# Patient Record
Sex: Male | Born: 1970 | Race: White | Hispanic: No | Marital: Married | Smoking: Never smoker
Health system: Southern US, Community
[De-identification: ages and names within clinical notes are randomized; demographics above are authoritative.]

## PROBLEM LIST (undated history)

## (undated) DIAGNOSIS — S83249A Other tear of medial meniscus, current injury, unspecified knee, initial encounter: Secondary | ICD-10-CM

## (undated) DIAGNOSIS — T7840XA Allergy, unspecified, initial encounter: Secondary | ICD-10-CM

## (undated) DIAGNOSIS — R030 Elevated blood-pressure reading, without diagnosis of hypertension: Secondary | ICD-10-CM

## (undated) DIAGNOSIS — T4145XA Adverse effect of unspecified anesthetic, initial encounter: Secondary | ICD-10-CM

## (undated) DIAGNOSIS — G249 Dystonia, unspecified: Secondary | ICD-10-CM

## (undated) DIAGNOSIS — Z87442 Personal history of urinary calculi: Secondary | ICD-10-CM

## (undated) DIAGNOSIS — N200 Calculus of kidney: Secondary | ICD-10-CM

## (undated) DIAGNOSIS — R51 Headache: Secondary | ICD-10-CM

## (undated) DIAGNOSIS — G5620 Lesion of ulnar nerve, unspecified upper limb: Secondary | ICD-10-CM

## (undated) DIAGNOSIS — M7542 Impingement syndrome of left shoulder: Secondary | ICD-10-CM

## (undated) DIAGNOSIS — T8859XA Other complications of anesthesia, initial encounter: Secondary | ICD-10-CM

## (undated) DIAGNOSIS — K859 Acute pancreatitis without necrosis or infection, unspecified: Secondary | ICD-10-CM

## (undated) DIAGNOSIS — F419 Anxiety disorder, unspecified: Secondary | ICD-10-CM

## (undated) DIAGNOSIS — Z9049 Acquired absence of other specified parts of digestive tract: Secondary | ICD-10-CM

## (undated) HISTORY — DX: Impingement syndrome of left shoulder: M75.42

## (undated) HISTORY — DX: Acute pancreatitis without necrosis or infection, unspecified: K85.90

## (undated) HISTORY — DX: Dystonia, unspecified: G24.9

## (undated) HISTORY — PX: ESOPHAGOGASTRODUODENOSCOPY: SHX1529

## (undated) HISTORY — DX: Calculus of kidney: N20.0

## (undated) HISTORY — PX: COLON SURGERY: SHX602

## (undated) HISTORY — DX: Lesion of ulnar nerve, unspecified upper limb: G56.20

## (undated) HISTORY — DX: Allergy, unspecified, initial encounter: T78.40XA

## (undated) HISTORY — DX: Anxiety disorder, unspecified: F41.9

## (undated) HISTORY — DX: Other tear of medial meniscus, current injury, unspecified knee, initial encounter: S83.249A

## (undated) HISTORY — PX: HERNIA REPAIR: SHX51

## (undated) HISTORY — PX: WISDOM TOOTH EXTRACTION: SHX21

## (undated) HISTORY — DX: Headache: R51

---

## 1991-06-03 HISTORY — PX: CLOSED REDUCTION CLAVICLE FRACTURE: SUR253

## 1998-06-02 HISTORY — PX: LITHOTRIPSY: SUR834

## 2006-06-02 HISTORY — PX: COLONOSCOPY: SHX174

## 2006-06-02 HISTORY — PX: GALLBLADDER SURGERY: SHX652

## 2006-06-02 LAB — HM COLONOSCOPY

## 2006-12-02 ENCOUNTER — Encounter: Payer: Self-pay | Admitting: Family Medicine

## 2007-02-24 ENCOUNTER — Encounter: Payer: Self-pay | Admitting: Family Medicine

## 2007-03-25 ENCOUNTER — Encounter: Payer: Self-pay | Admitting: Family Medicine

## 2007-04-05 ENCOUNTER — Ambulatory Visit (HOSPITAL_COMMUNITY): Admission: RE | Admit: 2007-04-05 | Discharge: 2007-04-05 | Payer: Self-pay | Admitting: Gastroenterology

## 2007-05-17 ENCOUNTER — Encounter (INDEPENDENT_AMBULATORY_CARE_PROVIDER_SITE_OTHER): Payer: Self-pay | Admitting: General Surgery

## 2007-05-17 ENCOUNTER — Ambulatory Visit (HOSPITAL_COMMUNITY): Admission: RE | Admit: 2007-05-17 | Discharge: 2007-05-17 | Payer: Self-pay | Admitting: General Surgery

## 2007-06-03 HISTORY — PX: CHOLECYSTECTOMY: SHX55

## 2009-06-02 HISTORY — PX: HERNIA REPAIR: SHX51

## 2009-06-02 HISTORY — PX: OTHER SURGICAL HISTORY: SHX169

## 2010-01-22 ENCOUNTER — Ambulatory Visit: Payer: Self-pay | Admitting: Family Medicine

## 2010-01-22 DIAGNOSIS — M25519 Pain in unspecified shoulder: Secondary | ICD-10-CM

## 2010-01-22 DIAGNOSIS — G43909 Migraine, unspecified, not intractable, without status migrainosus: Secondary | ICD-10-CM

## 2010-01-22 DIAGNOSIS — N200 Calculus of kidney: Secondary | ICD-10-CM | POA: Insufficient documentation

## 2010-01-22 DIAGNOSIS — J309 Allergic rhinitis, unspecified: Secondary | ICD-10-CM

## 2010-01-22 DIAGNOSIS — Z9189 Other specified personal risk factors, not elsewhere classified: Secondary | ICD-10-CM | POA: Insufficient documentation

## 2010-01-22 HISTORY — DX: Pain in unspecified shoulder: M25.519

## 2010-01-22 HISTORY — DX: Migraine, unspecified, not intractable, without status migrainosus: G43.909

## 2010-01-22 HISTORY — DX: Calculus of kidney: N20.0

## 2010-01-22 HISTORY — DX: Allergic rhinitis, unspecified: J30.9

## 2010-01-23 LAB — CONVERTED CEMR LAB
AST: 32 units/L (ref 0–37)
Alkaline Phosphatase: 65 units/L (ref 39–117)
Cholesterol: 157 mg/dL (ref 0–200)
Glucose, Bld: 97 mg/dL (ref 70–99)
Total CHOL/HDL Ratio: 6
Total Protein: 7.5 g/dL (ref 6.0–8.3)

## 2010-01-28 ENCOUNTER — Telehealth: Payer: Self-pay | Admitting: Family Medicine

## 2010-01-30 ENCOUNTER — Telehealth: Payer: Self-pay | Admitting: Family Medicine

## 2010-02-01 DIAGNOSIS — Z8719 Personal history of other diseases of the digestive system: Secondary | ICD-10-CM | POA: Insufficient documentation

## 2010-02-01 HISTORY — DX: Personal history of other diseases of the digestive system: Z87.19

## 2010-02-05 ENCOUNTER — Telehealth: Payer: Self-pay | Admitting: Family Medicine

## 2010-02-20 ENCOUNTER — Telehealth: Payer: Self-pay | Admitting: Family Medicine

## 2010-02-20 DIAGNOSIS — R300 Dysuria: Secondary | ICD-10-CM | POA: Insufficient documentation

## 2010-03-18 ENCOUNTER — Encounter: Payer: Self-pay | Admitting: Family Medicine

## 2010-05-03 ENCOUNTER — Ambulatory Visit: Payer: Self-pay | Admitting: Family Medicine

## 2010-05-10 ENCOUNTER — Encounter: Payer: Self-pay | Admitting: Family Medicine

## 2010-05-31 ENCOUNTER — Telehealth: Payer: Self-pay | Admitting: Family Medicine

## 2010-05-31 DIAGNOSIS — F411 Generalized anxiety disorder: Secondary | ICD-10-CM

## 2010-05-31 DIAGNOSIS — F419 Anxiety disorder, unspecified: Secondary | ICD-10-CM | POA: Insufficient documentation

## 2010-05-31 HISTORY — DX: Generalized anxiety disorder: F41.1

## 2010-06-02 HISTORY — PX: APPENDECTOMY: SHX54

## 2010-06-11 ENCOUNTER — Telehealth (INDEPENDENT_AMBULATORY_CARE_PROVIDER_SITE_OTHER): Payer: Self-pay | Admitting: *Deleted

## 2010-07-01 ENCOUNTER — Ambulatory Visit
Admission: RE | Admit: 2010-07-01 | Discharge: 2010-07-01 | Payer: Self-pay | Source: Home / Self Care | Attending: Family Medicine | Admitting: Family Medicine

## 2010-07-02 ENCOUNTER — Telehealth: Payer: Self-pay | Admitting: Family Medicine

## 2010-07-02 NOTE — Consult Note (Signed)
Summary: [facility]  [facility]   Imported By: Lanelle Bal 03/26/2010 09:22:42  _____________________________________________________________________  External Attachment:    Type:   Image     Comment:   External Document

## 2010-07-02 NOTE — Assessment & Plan Note (Signed)
Summary: CPX/TRANSFER FROM [place]/[facility]   Vital Signs:  Patient profile:   40 year old male Height:      67 inches Weight:      199.50 pounds BMI:     31.36 Temp:     98.7 degrees F oral Pulse rate:   88 / minute Pulse rhythm:   regular BP sitting:   114 / 84  (left arm) Cuff size:   large  Vitals Entered By: Delilah Shan CMA Duncan Dull) (January 22, 2010 11:30 AM) CC: CPX - Transfer from [place]   History of Present Illness: Transferring from [place].   H/o HA.  Dx'd with sinus infection and treated with doxy.  Still with intermittent HA at base of skull and radiating through the head.  Heat tends to make it worse.  Has been stuffy and with rhinorrhea.  Taking claritin daily with some relief.  Some better if rested.  Lights make it worse.  HA going on for weeks intermittently.  Occ nausea with HA.  Occ ringing in the ears.   No focal neuro changes. H/o anxiety and had old rx for xanax.  This helped some with the HA.  Using nasal saline frequently.   L shoulder pain and decrease in ROM.  H/o clavicle fx in distant past. Occ numbness in L hand.  Pain with pushing and pulling.  Unable to ext rotate well and extend arm above head.    Maternal history of hepatic disease of unclear source.  no jaundice or abdominal pain with patient.   Preventive Screening-Counseling & Management  Alcohol-Tobacco     Smoking Status: never  Caffeine-Diet-Exercise     Does Patient Exercise: yes      Drug Use:  no.    Current Medications (verified): 1)  Doxycycline Hyclate 100 Mg Caps (Doxycycline Hyclate) .... Take 1 Capsule By Mouth Two Times A Day  Completed Course On Sunday 01/20/10 2)  Advil 200 Mg Tabs (Ibuprofen) .... Take 2 or 3 As Needed  Allergies (verified): 1)  ! Sulfa 2)  ! Keflex 3)  ! Codeine 4)  ! Morphine  Past History:  Past Medical History: NEPHROLITHIASIS, HX OF (ICD-V13.01)- resolved after stopping sodas HEADACHE (ICD-784.0) ALLERGIC RHINITIS (ICD-477.9) CHICKENPOX, HX OF  (ICD-V15.9) MIGRAINE HEADACHE (ICD-346.90)  Past Surgical History: 2008   Gall Bladder 2011   Repair of hernia from gallbladder surgery  ~2000 lithotripsy 2008  colonoscopy   Family History: Reviewed history and no changes required. M alive, h/o ovarian CA and liver disease of unknown source F alive, carotid disease Aunt with CVA  Social History: Reviewed history and no changes required. 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  Otherwise negative.    Physical Exam  General:  GEN: nad, alert and oriented HEENT: mucous membranes moist NECK: supple w/o LA but trap and posterior paraspinal muscles are ttp CV: rrr.  no murmur PULM: ctab, no inc wob ABD: soft, +bs EXT: no edema SKIN: no acute rash  CN 2-12 wnl B, S/S/DTR wnl x4  L shoulder with mild winging of scapula noted.  pain with ext rotation.  No pain with int rotation.  Slightly weak but w/o pain on L supraspinatus testing.  With scapula compressed posteriorly, pain with ROM  decreases and AROM increases for abduction and ext rotation. L clavicle with asymmetry noted from prev fx.  No AC joint pain.    Impression & Recommendations:  Problem # 1:  Preventive Health Care (ICD-V70.0) Tdap today.  See labs. D/w patient [id number] and exercise.  Flu shot encouraged.  Colonoscopy done 2008.  Records requested.   Problem # 2:  HEADACHE (ICD-784.0) Start nasonex and follow up as needed.  this may be an allergic source triggering migraines.   His updated medication list for this problem includes:    Advil 200 Mg Tabs (Ibuprofen) .Marland Kitchen... Take 2 or 3 as needed  Problem # 3:  SHOULDER PAIN, LEFT (ICD-719.41) D/w patient re: back exercises and shoulder exercises to alter  scapular positioning.  follow up as needed.  His updated medication list for this problem includes:    Advil 200 Mg Tabs (Ibuprofen) .Marland Kitchen... Take 2 or 3 as needed  Problem # 4:  FAMILY HISTORY OF OTHER CONDITION (ICD-V19.8)  Maternal h/o liver disease.  will check baseline LFTs.   Orders: TLB-Hepatic/Liver Function Pnl (80076-HEPATIC)  Complete Medication List: 1)  Advil 200 Mg Tabs (Ibuprofen) .... Take 2 or 3 as needed  Other Orders: Tdap => 34yrs IM (04540) Admin 1st Vaccine (98119) TLB-Lipid Panel (80061-LIPID) TLB-Glucose, QUANT (82947-GLU)  Patient Instructions: 1)  I want you to use the nasonex 1 spray per nostril per day and let me know if you need another prescription.  I would do the exercises (shoulder shrugs and back exercises) and call me if you aren't improving.  Let me know if the headaches don't get better.  2)  We'll contact you with your lab report.  3)  Please schedule a follow-up appointment as needed .    Preventive Care Screening  Colonoscopy:    Date:  06/02/2006    Results:  Done    Immunizations Administered:  Tetanus Vaccine:    Vaccine Type: Tdap    Site: left deltoid    Mfr: GlaxoSmithKline    Dose: 0.5 ml    Route: IM    Given by: Delilah Shan CMA (AAMA)    Exp. Date: 03/21/2012    Lot #: [id number]    VIS given: 04/20/07 version given January 22, 2010.

## 2010-07-02 NOTE — Progress Notes (Signed)
Summary: had normal eye exam  Phone Note Call from Patient Call back at Home Phone [phone]   Caller: Patient Call For: Crawford Givens MD Summary of Call: Pt had his eyes checked to rule out possible reasons for headaches.  He said everything checked  out fine.  He is asking when do you want him to come in for follow up. Initial call taken by: Lowella Petties CMA,  January 30, 2010 10:58 AM  Follow-up for Phone Call        Have patient keep headache diary and then come in about 10days to 2 weeks from now.  Follow-up by: Crawford Givens MD,  January 30, 2010 12:22 PM  Additional Follow-up for Phone Call Additional follow up Details #1::        Patient notified as instructed by telephone. Patient stated that he will call back and schedule the appointment after he works out something with his job. Additional Follow-up by: Sydell Axon LPN,  January 30, 2010 1:35 PM

## 2010-07-02 NOTE — Progress Notes (Signed)
Summary: still having headaches  Phone Note Call from Patient Call back at Home Phone [phone]   Caller: Patient Call For: Crawford Givens MD Summary of Call: Pt was seen last week and given nasonex to try. This helped with the congestion, and he is breathing better.  He says he is still having headaches, which are causing him a lot of problems, and is asking what is the next step.  [facility] [address]. Initial call taken by: Lowella Petties CMA,  January 28, 2010 1:11 PM  Follow-up for Phone Call        Congestion is better but still with irregularly intermittent HA at eyes.   Off NSAIDS in meantime.  patient is going to check with eye clinic first and then notify me if his symptoms continue.  We can consider migraine tx at that point or referral for HA eval.  Follow-up by: Crawford Givens MD,  January 28, 2010 1:36 PM

## 2010-07-02 NOTE — Procedures (Signed)
Summary: Colonoscopy/[facility]  Colonoscopy/[facility]   Imported By: Lanelle Bal 02/11/2010 11:51:44  _____________________________________________________________________  External Attachment:    Type:   Image     Comment:   External Document

## 2010-07-02 NOTE — Assessment & Plan Note (Signed)
Summary: DISCUSS HEADACHES/[facility]   Vital Signs:  Patient profile:   40 year old male Height:      67 inches Weight:      200 pounds BMI:     31.44 Temp:     98.8 degrees F oral Pulse rate:   88 / minute Pulse rhythm:   regular BP sitting:   126 / 78  (left arm) Cuff size:   large  Vitals Entered By: Delilah Shan CMA Duncan Dull) (May 03, 2010 12:15 PM) CC: Fatigue, discuss HA's, Headache   History of Present Illness: HA- treated with nasonex and HA didn't fully improve but congestion did get better.  Still with HA that is similar to before but not as severe/frequent.  Inc in HA with mild products but certain foods trigger it.  Inc in stress can lead to HA, and increase in frequency.    HA per patient- sharp pain, behind both eyes and occ on base of neck posteriorlly.  Pain around both orbits.  More frequent with fasting.  Had follow up with eye clinic.  Rare advil use.    Had follow up with uro.  No intervention planned.   Allergies: 1)  ! Sulfa 2)  ! Keflex 3)  ! Codeine 4)  ! 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  Otherwise negative.    Physical Exam  General:  GEN: nad, alert and oriented HEENT: mucous membranes moist, TM wnl, no cobblestoning in OP NECK: supple w/o LA  CV: rrr.  no murmur PULM: ctab, no inc wob ABD: soft, +bs EXT: no edema SKIN: no acute rash  PERRL, fundus wnlx2, CN 2-12 wnl B, S/S/DTR wnl x4    Impression &  Recommendations:  Problem # 1:  HEADACHE (ICD-784.0) >25 min spent with patient, at least half of which was spent on counseling re:dx and plan.  I would add on claritin to see if it helps with allergy sx (trigger for migraines?) refer to neuro for further eval and consideration of proph med.  He agrees.  Will await neuro recs.   His updated medication list for this problem includes:    Advil 200 Mg Tabs (Ibuprofen) .Marland Kitchen... Take 2 or 3 as needed  Complete Medication List: 1)  Advil 200 Mg Tabs (Ibuprofen) .... Take 2 or 3 as needed 2)  Nasonex 50 Mcg/act Susp (Mometasone furoate) .Marland Kitchen.. 1-2 sprays per nostril per day 3)  Alprazolam 0.5 Mg Tabs (Alprazolam) .... Take 1/2 to 1 tablet by mouth as needed 4)  Claritin 10 Mg Tabs (Loratadine) .Marland Kitchen.. 1 by mouth once daily  Other Orders: Admin 1st Vaccine (75643) Flu Vaccine 71yrs + [phone]) Neurology Referral (Neuro)  Patient Instructions: 1)  I would stay on the nasonex and start taking claritin/loratadine 10mg  a day.  See Shirlee Limerick about your referral before your leave today.   I would like neuro input on using prophylactic meds in addition to the allergy meds.  Take care.  Glad to see you.    Orders Added: 1)  Admin 1st Vaccine [90471] 2)  Flu Vaccine 98yrs + [90658] 3)  Est. Patient Level IV [16109] 4)  Neurology Referral [Neuro]    Current Allergies (reviewed today): ! SULFA ! KEFLEX ! CODEINE ! MORPHINE Flu Vaccine Consent Questions     Do you have a history of severe allergic reactions to this vaccine? no    Any prior history of allergic reactions to egg and/or gelatin? no    Do you have a sensitivity to the preservative Thimersol? no    Do you have a past history of Guillan-Barre Syndrome? no    Do you currently have an acute febrile illness? no    Have you ever had a severe reaction to latex? no    Vaccine information given and explained to patient? yes    Are you currently pregnant? no    Lot Number:[id number]   Exp  Date:11/30/2010   Site Given  Left Deltoid IM  Lugene Fuquay CMA (AAMA)  May 03, 2010 12:26 PM      .lbflu Clelia Schaumann Q&E-CCC]

## 2010-07-02 NOTE — Progress Notes (Signed)
Summary: pt requests phone call  Phone Note Call from Patient Call back at Home Phone [phone]   Summary of Call: Pt is asking that you call him to discuss a urology referral. Initial call taken by: Lowella Petties CMA,  February 20, 2010 2:12 PM  Follow-up for Phone Call        LMOVM for patient to call back.  Will await return call.  Follow-up by: Crawford Givens MD,  February 20, 2010 3:22 PM  Additional Follow-up for Phone Call Additional follow up Details #1::        Patient returned your call. 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  He has had some changes in his stream.  Asking about uro referral.  I will refer.   Follow-up by: Crawford Givens MD,  February 20, 2010 5:18 PM  New Problems: DYSURIA (ICD-788.1)

## 2010-07-02 NOTE — Progress Notes (Signed)
Summary: Rx Nasonex  Phone Note Call from Patient Call back at Home Phone [phone]   Caller: Patient Call For: Crawford Givens MD Summary of Call: Patient was given samples of Nasonex and they worked good for him. Patient would like for you to send him in a rx for this to the pharmacy. [facility] Initial call taken by: Sydell Axon LPN,  February 05, 2010 1:00 PM  Follow-up for Phone Call       Follow-up by: Crawford Givens MD,  February 05, 2010 1:47 PM    New/Updated Medications: NASONEX 50 MCG/ACT SUSP (MOMETASONE FUROATE) 1-2 sprays per nostril per day Prescriptions: NASONEX 50 MCG/ACT SUSP (MOMETASONE FUROATE) 1-2 sprays per nostril per day  #1 x 12   Entered and Authorized by:   Crawford Givens MD   Signed by:   Crawford Givens MD on 02/05/2010   Method used:   [phone] (retail[facility]       [address]       [place], [place]  [address]       Ph: [phone] or [phone]       Fax: [phone]   RxID:   [id number]

## 2010-07-02 NOTE — Letter (Signed)
Summary: [facility] [place]   Imported By: Lanelle Bal 02/11/2010 11:52:28  _____________________________________________________________________  External Attachment:    Type:   Image     Comment:   External Document

## 2010-07-04 NOTE — Progress Notes (Signed)
Summary: copy of immunizations given to pt  Phone Note Call from Patient   Caller: Patient Summary of Call: Pt requests copy of immunizations.  Copy made, pt to pick up. Initial call taken by: Lowella Petties CMA, AAMA,  June 11, 2010 2:40 PM

## 2010-07-04 NOTE — Consult Note (Signed)
Summary: [facility]  [facility]   Imported By: Lanelle Bal 05/20/2010 13:40:09  _____________________________________________________________________  External Attachment:    Type:   Image     Comment:   External Document  Appended Document: [facility]    Clinical Lists Changes  Observations: Added new observation of PAST MED HX: NEPHROLITHIASIS, HX OF (ICD-V13.01)- resolved after stopping sodas HEADACHE (ICD-784.0)- HA clinic eval 12/11 ALLERGIC RHINITIS (ICD-477.9) CHICKENPOX, HX OF (ICD-V15.9) MIGRAINE HEADACHE (ICD-346.90) Pancreatitis, hx of (05/25/2007) Biliary Dyskinesia (05/25/2007) Diffuse fatty infiltration of liver (Abd U/S 12/02/2006) 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 (05/20/2010 14:01)       Past History:  Past Medical History: NEPHROLITHIASIS, HX OF (ICD-V13.01)- resolved after stopping sodas HEADACHE (ICD-784.0)- HA clinic eval 12/11 ALLERGIC RHINITIS (ICD-477.9) CHICKENPOX, HX OF (ICD-V15.9) MIGRAINE HEADACHE (ICD-346.90) Pancreatitis, hx of (05/25/2007) Biliary Dyskinesia (05/25/2007) Diffuse fatty infiltration of liver (Abd U/S 12/02/2006) 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

## 2010-07-04 NOTE — Progress Notes (Signed)
Summary: refill request for alprazolam  Phone Note Refill Request Message from:  Fax from Pharmacy  Refills Requested: Medication #1:  ALPRAZOLAM 0.5 MG TABS Take 1/2 to 1 tablet by mouth as needed   Last Refilled: 10/03/2009 Faxed request from [address], [phone].  Initial call taken by: Lowella Petties CMA, AAMA,  May 31, 2010 8:56 AM  Follow-up for Phone Call        please call in.  Follow-up by: Crawford Givens MD,  May 31, 2010 2:06 PM  Additional Follow-up for Phone Call Additional follow up Details #1::        Medication phoned to pharmacy.  Additional Follow-up by: Delilah Shan CMA Duncan Dull),  May 31, 2010 3:23 PM  New Problems: ANXIETY (ICD-300.00)   New Problems: ANXIETY (ICD-300.00) Prescriptions: ALPRAZOLAM 0.5 MG TABS (ALPRAZOLAM) Take 1/2 to 1 tablet by mouth as needed  #30 x 1   Entered and Authorized by:   Crawford Givens MD   Signed by:   Crawford Givens MD on 05/31/2010   Method used:   Telephoned to ...       [facility] [address] (retail)       [address]       [place], [place]  [address]       Ph: [phone] or [phone]       Fax: [phone]   RxID:   [id number]

## 2010-07-10 NOTE — Assessment & Plan Note (Signed)
Summary: follow up on headaches/alc   Vital Signs:  Patient profile:   40 year old male Height:      67 inches Weight:      197.25 pounds BMI:     31.01 Temp:     98.8 degrees F oral Pulse rate:   84 / minute Pulse rhythm:   regular BP sitting:   122 / 84  (left arm) Cuff size:   regular  Vitals Entered By: Delilah Shan CMA Breyden Jeudy Dull) (July 01, 2010 3:27 PM) CC: Follow up on headaches   History of Present Illness: Has seen Dr. Neale Burly at [facility] center.  Thought to be migraines.  Had trigger point injections and those helped for about 1 week.  HA severity increased with certain foods (poundcake).  On topamax and zanaflex and tolerating these.  He had some vivid dreams and decrease in energy level on higher doses of topamax.    The pain behind the eyes is better on the topamax.  The source of his pain is coming from the back of the head near the occiput and radiates through the head.   He's seen ortho about his neck and neck MRI was w/o sig abnormality per patient.  The pain in the posterior portion of the skull is better with manipulation and masage.  He is in massage therapy and is off the nasonex.    He is taking 0.125mg  xanax (instead of the 0.5mg ) instead of the zanaflex.  The zanaflex makes him too drowsy.  Pt couldn't tolerate flexeril due to being drowsy.    Allergies: 1)  ! Sulfa 2)  ! Keflex 3)  ! Codeine 4)  ! Morphine 5)  ! * Topamax 6)  ! 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) Left Shoulder stage II impingement (02/24/2007) 3mm polyp in hepatic flexure (resected and  retrieved 03/25/2007) Anxiety, episodic  Social History: Last updated: 07/01/2010 Marital Status: long term relationship Children: no Occupation: Risk analyst, 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  Otherwise negative.    Physical Exam  General:  no apparent distress normocephalic atraumatic perrl eomi mucous membranes moist neck supple  regular rate and rhythm  clear to auscultation bilaterally CN 2-12 wnl B, S/S/DTR wnl x4  occiput slightly tender to palpation    Impression & Recommendations:  Problem # 1:  HEADACHE (ICD-784.0) I called Dr. Neale Burly and left a message.  I would like to talk with him about options. I don't have a reason to stop the xanax use at this point, as if appears to be helping.  I will d/w HA clinic and then send notice to the patient.  Pt agrees.  The following medications were removed from the medication list:    Advil 200 Mg Tabs (Ibuprofen) .Marland Kitchen... Take 2 or 3 as  needed  Complete Medication List: 1)  Alprazolam 0.5 Mg Tabs (Alprazolam) .... Take 1/4 to 1/2  tablet by mouth as needed 2)  Topamax 50 Mg Tabs (Topiramate) .... Take 1 tab by mouth at bedtime 3)  Tizanidine Hcl 2 Mg Tabs (Tizanidine hcl) .... Take 1/2 tablet by mouth as needed for headache.  Patient Instructions: 1)  I'll call Dr. Neale Burly and send more information after that.  Don't change your meds in the meantime.     Orders Added: 1)  Est. Patient Level IV [16109]    Current Allergies (reviewed today): ! SULFA ! KEFLEX ! CODEINE !  MORPHINE ! * TOPAMAX ! FLEXERIL

## 2010-07-10 NOTE — Progress Notes (Signed)
  Phone Note Outgoing Call   Summary of Call: Please call patient.  I talked with Dr. Neale Burly and he is going to try to get the patient worked in.  Until then, it is okay use the low dose of xanax.  This isn't a long term solution, but it is okay to use until he has follow up with HA clinic.  Dr. Neale Burly agreed with the trial of the occiput pillow.  Have patient check at a medical supply store to see if he can get one.  Have patient call back to HA clinic and see about getting worked into the schedule there (sooner than his next scheduled OV). thanks.  Initial call taken by: Crawford Givens MD,  July 02, 2010 10:05 AM  Follow-up for Phone Call        Left message on voicemail  to return call. Lugene Fuquay CMA Daun Rens Dull)  July 02, 2010 11:52 AM   Patient Advised.  Follow-up by: Delilah Shan CMA (AAMA),  July 02, 2010 12:21 PM

## 2010-07-18 ENCOUNTER — Ambulatory Visit (INDEPENDENT_AMBULATORY_CARE_PROVIDER_SITE_OTHER): Payer: Self-pay | Admitting: Family Medicine

## 2010-07-18 ENCOUNTER — Other Ambulatory Visit: Payer: Self-pay | Admitting: Family Medicine

## 2010-07-18 ENCOUNTER — Ambulatory Visit (INDEPENDENT_AMBULATORY_CARE_PROVIDER_SITE_OTHER)
Admission: RE | Admit: 2010-07-18 | Discharge: 2010-07-18 | Disposition: A | Discharge status: Home / Self Care | Payer: BC Managed Care – PPO | Source: Ambulatory Visit | Attending: Family Medicine | Admitting: Family Medicine

## 2010-07-18 ENCOUNTER — Encounter: Payer: Self-pay | Admitting: Family Medicine

## 2010-07-18 ENCOUNTER — Observation Stay (HOSPITAL_COMMUNITY)
Admission: EM | Admit: 2010-07-18 | Discharge: 2010-07-19 | DRG: 883 | Disposition: A | Discharge status: Home / Self Care | Payer: BC Managed Care – PPO | Source: Ambulatory Visit | Attending: Surgery | Admitting: Surgery

## 2010-07-18 DIAGNOSIS — R1031 Right lower quadrant pain: Secondary | ICD-10-CM | POA: Insufficient documentation

## 2010-07-18 DIAGNOSIS — K3580 Unspecified acute appendicitis: Principal | ICD-10-CM | POA: Insufficient documentation

## 2010-07-18 DIAGNOSIS — Z01812 Encounter for preprocedural laboratory examination: Secondary | ICD-10-CM | POA: Insufficient documentation

## 2010-07-18 HISTORY — DX: Acquired absence of other specified parts of digestive tract: Z90.49

## 2010-07-18 LAB — BASIC METABOLIC PANEL
BUN: 5 mg/dL — ABNORMAL LOW (ref 6–23)
Calcium: 9.2 mg/dL (ref 8.4–10.5)
GFR calc Af Amer: >60 mL/min (ref >60)
GFR calc non Af Amer: >60 mL/min (ref >60)
Sodium: 140 mEq/L (ref 135–145)

## 2010-07-18 LAB — DIFFERENTIAL
Basophils Relative: 0 % (ref 0–1)
Lymphs Abs: 2.2 10*3/uL (ref 0.7–4.0)
Monocytes Absolute: 0.8 10*3/uL (ref 0.1–1.0)
Monocytes Relative: 10 % (ref 3–12)
Neutro Abs: 4.7 10*3/uL (ref 1.7–7.7)
Neutrophils Relative %: 59 % (ref 43–77)

## 2010-07-18 LAB — CBC
Platelets: 203 10*3/uL (ref 150–400)
RBC: 5.41 MIL/uL (ref 4.22–5.81)
RDW: 12 % (ref 11.5–15.5)
WBC: 7.9 10*3/uL (ref 4.0–10.5)

## 2010-07-18 MED ORDER — IOHEXOL 300 MG/ML  SOLN
100.0000 mL | Freq: Once | INTRAMUSCULAR | Status: AC | PRN
  Administered 2010-07-18: 100 mL via INTRAVENOUS

## 2010-07-19 ENCOUNTER — Other Ambulatory Visit: Payer: Self-pay | Admitting: General Surgery

## 2010-07-24 ENCOUNTER — Encounter: Payer: Self-pay | Admitting: Family Medicine

## 2010-07-24 NOTE — Assessment & Plan Note (Signed)
Summary: RIGHT SIDE PAIN/CLE   [employer]   Vital Signs:  Patient profile:   40 year old male Height:      67 inches Weight:      194.25 pounds BMI:     30.53 Temp:     98.9 degrees F oral Pulse rate:   84 / minute Pulse rhythm:   regular BP sitting:   132 / 80  (left arm) Cuff size:   regular  Vitals Entered By: Delilah Shan CMA Kathlyn Leachman Dull) (July 18, 2010 11:04 AM) CC: Right side pain   History of Present Illness: Has had follow up with [facility] center and HAs are improved.  Off maintenance meds currently.    R sided abdominal pain.  Started Tuesday with some bloating in the center of his abdomen.  It was tender diffusely and then he had some R sided pain.  Pain moved to RLQ.  "I didn't know if it was gas pain."  He took gas-x in the meantime.  Some mildly loose stools.  Pain is better now, but had been worse a recently as a few hours ago.  Temp  ~99 yesterday.  He still has appendix.  s/p cholecystectomy.  Some nausea-mild; mainly decrease in appetite.    Allergies: 1)  ! Sulfa 2)  ! Keflex 3)  ! Codeine 4)  ! Morphine 5)  ! * Topamax 6)  ! 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  Otherwise negative.    Physical Exam  General:  no apparent distress but mildly uncomfortable due to RLQ, esp with movement/palpation normocephalic atraumatic mucous membranes moist regular rate and rhythm clear to auscultation bilaterally abdomen soft, tender to palpation in RLQ but not tender to palpation o/w.  normal bs.  no rebound.  ext well perfused.    Impression & Recommendations:  Problem # 1:  RLQ PAIN (ICD-789.03) concern for appendicitis.  refer for CT.   Orders: Radiology Referral (Radiology)  Complete Medication List: 1)  Alprazolam 0.5 Mg Tabs (Alprazolam) .... Take 1/4 to 1/2  tablet by mouth as needed 2)  Topamax 50 Mg Tabs (Topiramate) .... Take 1 tab by mouth at bedtime 3)  Tizanidine Hcl 2 Mg Tabs (Tizanidine hcl) .... Take 1/2 tablet by mouth as needed for headache.  Patient Instructions: 1)  See Shirlee Limerick about your referral before your leave today.  We'll talk to you about the scan results.     Orders Added: 1)  Est. Patient Level IV [16109] 2)  Radiology Referral [Radiology]    Current Allergies (reviewed today): ! SULFA ! KEFLEX ! CODEINE ! MORPHINE ! * TOPAMAX ! FLEXERIL

## 2010-07-25 NOTE — H&P (Signed)
  [name], [name]               ACCOUNT NO.:  [id number]  MEDICAL RECORD NO.:  [id number]           PATIENT TYPE:  E  LOCATION:  [facility]                         FACILITY:  [facility]  PHYSICIAN:  Gabrielle Dare. Janee Morn, M.D.DATE OF BIRTH:  June 14, 1970  DATE OF ADMISSION:  07/18/2010 DATE OF DISCHARGE:                             HISTORY & PHYSICAL   CHIEF COMPLAINT:  Right lower quadrant abdominal pain.  HISTORY OF PRESENT ILLNESS:  [name] is a 40 year old gentleman who complains of abdominal fullness that was generalized, it started at 10:00 p.m. 2 days ago.  This gradually changed into pain in his right lower quadrant.  He had significantly decreased appetite.  He has not eaten any food for the past 24 hours.  He went and saw his primary physician today, it was Dr. Para March who ordered an outpatient CT scan of the abdomen and pelvis.  This demonstrated acute appendicitis, and he was directed to the emergency department.  PAST MEDICAL HISTORY:  Kidney stones and heat stroke.  PAST SURGICAL HISTORY:  Laparoscopic cholecystectomy and umbilical hernia repair done by Dr. Avel Peace from our practice.  SOCIAL HISTORY:  He is a Gaffer.  He does not smoke.  He occasionally drinks alcohol.  REVIEW OF SYSTEMS:  Include GI complaints as above and are otherwise unremarkable.  CURRENT MEDICATIONS:  At home, none.  ALLERGIES:  CODEINE, MORPHINE causing nausea, SULFA and KEFLEX.  PHYSICAL EXAMINATION:  GENERAL:  He is awake and alert.  He is in no distress. VITAL SIGNS:  Temperature 98.7, heart rate 93, blood pressure 131/96, respirations are 22, are saturation is 97%. HEENT:  Head is normocephalic and atraumatic.  Ears are clear.  Eyes: Pupils are equal and reactive.  Oral mucosa is somewhat dry. NECK:  Supple with no tenderness.  Trachea is in the midline. LUNGS:  Clear to auscultation with no wheezing and good respiratory effort. HEART:  Normal S1 and S2.  Distal pulses  are 2+ with no peripheral edema. ABDOMEN:  Soft but he has tenderness in the right lower quadrant with voluntary guarding.  Bowel sounds are hypoactive.  No masses are felt. LYMPHATICS:  No significant lymphadenopathy. MUSCULOSKELETAL:  He is well developed and without deformity or tenderness. SKIN:  Warm and dry.  LABORATORY STUDIES:  Pending.  DIAGNOSTICS:  CT scan of the abdomen and pelvis demonstrates acute appendicitis.  IMPRESSION:  Acute appendicitis.  PLAN:  We will take him to the operating room for laparoscopic appendectomy.  Procedure risks and benefits were discussed in detail with the patient including the possibility of conversion to open procedure if needed.  He is agreeable.  Questions were answered.  We will proceed emergently tonight and give him IV antibiotics.     Gabrielle Dare Janee Morn, M.D.     BET/MEDQ  D:  07/18/2010  T:  07/19/2010  Job:  [id number]  Electronically Signed by Violeta Gelinas M.D. on 07/25/2010 01:25:16 PM

## 2010-07-25 NOTE — Op Note (Signed)
[name], [name]               ACCOUNT NO.:  [id number]  MEDICAL RECORD NO.:  [id number]           PATIENT TYPE:  I  LOCATION:  5122                         FACILITY:  [facility]  PHYSICIAN:  Gabrielle Dare. Janee Morn, M.D.DATE OF BIRTH:  [birth year]  DATE OF PROCEDURE:  07/18/2010 DATE OF DISCHARGE:                              OPERATIVE REPORT   PREOPERATIVE DIAGNOSIS:  Acute appendicitis.  POSTOPERATIVE DIAGNOSIS:  Acute appendicitis.  PROCEDURE:  Laparoscopic appendectomy.  SURGEON:  Gabrielle Dare. Janee Morn, MD.  Threasa HeadsMarcell Barlow, RNFA student.  ANESTHESIA:  General endotracheal.  HISTORY OF PRESENT ILLNESS:  [name] is a 40 year old gentleman, who presented to his primary care doctor's office today with right lower quadrant abdominal pain.  He was referred for CT scan, which showed acute appendicitis.  History and physical exam were also consistent with this.  I evaluated him in the emergency department.  He is brought emergently to the operating room for appendectomy.  PROCEDURE IN DETAIL:  Informed consent was obtained, the patient was identified in the preop holding area.  He received intravenous antibiotics.  He was brought to the operating room.  General endotracheal anesthesia was administered by the anesthesia staff.  His abdomen was prepped and draped in sterile fashion.  Time-out procedure was done.  The area above his umbilicus was chosen due to previous umbilical hernia repair with mesh.  This area was infiltrated with 0.25% Marcaine with epinephrine.  Incision was made.  Subcutaneous tissues were dissected down revealing the anterior fascia.  This was divided sharply along the midline and the peritoneal cavity was entered under direct vision without difficulty.  The 0 Vicryl pursestring suture was placed around the fascial opening, and the Hasson trocar was inserted in the abdomen.  The abdomen was insufflated with carbon dioxide in standard fashion.  Under  direct vision, a 12-mm left lower quadrant and a 5-mm midabdomen port were placed.  Marcaine 0.25% with epinephrine was used at all port sites.  Laparoscopic exploration revealed an acutely inflamed appendix that was not perforated.  The mid to distal portion was kind of stuck on the terminal ileum.  The base was noninflamed. This was addressed first.  The base was dissected and once a nice window was made, the base was divided with an Endo-GIA stapler with a vascular load and achieved excellent staple line.  The mesoappendix was then gradually divided with a harmonic scalpel sweeping away this adherent terminal ileum and keeping that undamaged.  The mesoappendix was divided with excellent hemostasis.  The appendix was placed in an EndoCatch bag and removed from the abdomen from the left lower quadrant port site. The area was copiously irrigated.  Meticulous hemostasis was ensured. Staple line appeared excellent, and the terminal ileum was uninjured. The residual irrigation fluid was evacuated and ports were removed under direct vision.  Pneumoperitoneum was released.  The supraumbilical fascia was closed by tying 0 Vicryl pursestring suture with care not to trap any intra-abdominal contents.  All three wounds were copiously irrigated.  The skin of each was closed with running 4-0 Vicryl subcuticular stitch followed by Dermabond.  Sponge, needle, and  instrument counts were all correct.  The patient tolerated the procedure well without apparent complication and was taken to recovery room in stable condition.     Gabrielle Dare Janee Morn, M.D.     BET/MEDQ  D:  07/18/2010  T:  07/19/2010  Job:  [id number]  cc:   Dr. Para March  Electronically Signed by Violeta Gelinas M.D. on 07/25/2010 01:25:20 PM

## 2010-08-29 NOTE — Letter (Signed)
Summary: [facility]   Imported By: Maryln Gottron 08/16/2010 13:16:26  _____________________________________________________________________  External Attachment:    Type:   Image     Comment:   External Document

## 2010-10-15 NOTE — Op Note (Signed)
[name], [name]               ACCOUNT NO.:  [id number]   MEDICAL RECORD NO.:  [id number]          PATIENT TYPE:  AMB   LOCATION:  DAY                          FACILITY:  [facility]   PHYSICIAN:  Adolph Pollack, M.D.DATE OF BIRTH:  [birth year]   DATE OF PROCEDURE:  05/17/2007  DATE OF DISCHARGE:                               OPERATIVE REPORT   PREOPERATIVE DIAGNOSES:  1. Biliary dyskinesia.  2. Pancreatitis.   POSTOPERATIVE DIAGNOSES:  1. Biliary dyskinesia.  2. Pancreatitis.   PROCEDURE:  Laparoscopic cholecystectomy with intraoperative  cholangiogram.   SURGEON:  Adolph Pollack, M.D.   ASSISTANT:  Consuello Bossier, MD.   ANESTHESIA:  General.   INDICATIONS:  [name] is a 40 year old male who has been  having right upper quadrant pressure-type pains radiating around to the  right back, associated with nausea.  He has had an episode of  pancreatitis.  An ultrasound demonstrated no cholelithiasis or  gallbladder wall thickening, no biliary dilatation.  He does have a  depressed ejection fraction on a nuclear medicine hepatic biliary scan  consistent with dyskinesia.  It was felt that he may have some  microlithiasis because there is no other obvious cause of his  pancreatitis.  He now presents for cholecystectomy.   TECHNIQUE:  He was seen in the holding area, brought to the operating  room, placed on the operating table and general anesthetic was  administered.  The hair on the abdominal wall was clipped and the area  sterilely prepped and draped.  Marcaine solution was infiltrated in the  subumbilical region.  A subumbilical incision was made through the skin,  subcutaneous tissue, fascia and peritoneum, entering the peritoneal  cavity under direct vision.  A pursestring suture of 0 Vicryl was placed  around the fascial edges.  A Hassan trocar was introduced into the  peritoneal cavity and pneumoperitoneum was created by insufflation of  CO2 gas.   Laparoscope was then introduced.  He was placed in reverse Trendelenburg  position with the right side tilted slightly up.  An 11 mm trocar was  placed through an epigastric incision and two 5 mm trocars placed in the  right upper quadrant region.   The fundus of the gallbladder was grasped with narrow adhesions between  the omentum and the gallbladder taken down carefully bluntly staying on  the gallbladder.  The fundus was then retracted toward the right  shoulder.  I then mobilized the infundibulum using blunt dissection,  staying on the gallbladder.  I was able to identify the cystic duct and  create a window around it.  At the cystic duct gallbladder junction a  clip was placed.  A small incision was made through the cystic duct.  A  cholangiocatheter was then passed through the anterior abdominal wall,  placed into the cystic duct and a cholangiogram was performed.   Under real time fluoroscopy, dilute contrast was injected into the  cystic duct which was of moderate length.  The common hepatic, right and  left hepatic and common bile ducts all filled promptly and contrast  drained into the  duodenum without obvious evidence of obstruction.  Final reports pending the radiologist's interpretation.  No biliary  dilatation was noted.   Following this, the cholangiocatheter was removed, the cystic duct was  clipped three times proximally and divided.  I then identified an  anterior and posterior branch of the cystic artery.  Windows were  created around these.  They were clipped and divided.  The gallbladder  was dissected free from the liver using electrocautery and placed in  Endopouch bag.   The gallbladder fossa was copiously irrigated.  No bleeding or bile leak  was noted.  The irrigation fluid was evacuated as much as possible.   The gallbladder was then removed through the subumbilical port in the  Endopouch bag and sent to pathology.  Of note was that when I initially  made  a small incision in the cystic duct, a small amount of sludge was  noted.   I closed the subumbilical fascial defect under laparoscopic vision by  tightening up and tying down the pursestring suture.  The remaining  trocars were removed and pneumoperitoneum was released.  The skin  incisions were closed with 4-0 Monocryl subcuticular stitches followed  by Steri-Strips and sterile dressings.  He tolerated the procedure well  without any apparent complications and was taken to the recovery room in  satisfactory condition.      Adolph Pollack, M.D.  Electronically Signed     TJR/MEDQ  D:  05/17/2007  T:  05/18/2007  Job:  [id number]   cc:   Danise Edge, M.D.  Fax: [phone]   Carma Leaven, DO  Fax: [phone]

## 2010-11-08 ENCOUNTER — Encounter: Payer: Self-pay | Admitting: Family Medicine

## 2010-11-11 ENCOUNTER — Ambulatory Visit: Payer: BC Managed Care – PPO | Admitting: Family Medicine

## 2010-11-13 ENCOUNTER — Encounter: Payer: Self-pay | Admitting: Family Medicine

## 2010-11-13 ENCOUNTER — Ambulatory Visit (INDEPENDENT_AMBULATORY_CARE_PROVIDER_SITE_OTHER): Payer: BC Managed Care – PPO | Admitting: Family Medicine

## 2010-11-13 ENCOUNTER — Ambulatory Visit: Payer: BC Managed Care – PPO | Admitting: Family Medicine

## 2010-11-13 DIAGNOSIS — J309 Allergic rhinitis, unspecified: Secondary | ICD-10-CM

## 2010-11-13 DIAGNOSIS — F411 Generalized anxiety disorder: Secondary | ICD-10-CM

## 2010-11-13 MED ORDER — MOMETASONE FUROATE 50 MCG/ACT NA SUSP: NASAL | Status: DC

## 2010-11-13 NOTE — Patient Instructions (Signed)
I would use the nasonex, 2 sprays per nostril, and see if that helps.  Call me with an update if not better.  Take care.  Glad to see you.

## 2010-11-13 NOTE — Progress Notes (Signed)
Back in school at [employer] with accelerated classes.  Taking 1/4mg  of xanax before tests, this helps with anxiety.  Some feelings of tiredness, but not truly fatigued consistently.  Sleeping well.   On nasonex for allergies, only 1 spray per nostril.  Springtime and summertime allergies can trigger the HA based on history and this is some better with nasonex.   Wants an incision check on abd wall after appy.  No bulge, but occ feels a pinch with certain movements.    Meds, vitals, and allergies reviewed.   ROS: See HPI.  Otherwise, noncontributory.  GEN: nad, alert and oriented HEENT: mucous membranes moist, nasal exam with minimal injection, tm wnl x2 NECK: supple w/o LA CV: rrr.  PULM: ctab, no inc wob ABD: soft, +bs, incisions well healed w/o hernia noted EXT: no edema SKIN: no acute rash

## 2010-11-14 ENCOUNTER — Encounter: Payer: Self-pay | Admitting: Family Medicine

## 2010-11-14 NOTE — Assessment & Plan Note (Signed)
Inc to 2 sprays per nostril and call back as needed.  D/w pt and he understood.

## 2010-11-14 NOTE — Assessment & Plan Note (Signed)
I would continue as is.  Overall, episodic sx controlled.  I think the occ tiredness may be related to anxiety, busy schedule.  Benign exam o/w and affect appropriate.

## 2010-12-05 ENCOUNTER — Encounter: Payer: Self-pay | Admitting: Family Medicine

## 2010-12-05 DIAGNOSIS — M47818 Spondylosis without myelopathy or radiculopathy, sacral and sacrococcygeal region: Secondary | ICD-10-CM | POA: Insufficient documentation

## 2010-12-05 DIAGNOSIS — M4698 Unspecified inflammatory spondylopathy, sacral and sacrococcygeal region: Secondary | ICD-10-CM | POA: Insufficient documentation

## 2010-12-05 HISTORY — DX: Unspecified inflammatory spondylopathy, sacral and sacrococcygeal region: M46.98

## 2010-12-06 ENCOUNTER — Encounter: Payer: Self-pay | Admitting: Family Medicine

## 2010-12-06 ENCOUNTER — Ambulatory Visit (INDEPENDENT_AMBULATORY_CARE_PROVIDER_SITE_OTHER): Payer: BC Managed Care – PPO | Admitting: Family Medicine

## 2010-12-06 VITALS — BP 130/90 | HR 72 | Temp 98.7°F | Wt 195.2 lb

## 2010-12-06 DIAGNOSIS — R5383 Other fatigue: Secondary | ICD-10-CM | POA: Insufficient documentation

## 2010-12-06 DIAGNOSIS — R5381 Other malaise: Secondary | ICD-10-CM

## 2010-12-06 LAB — CBC WITH DIFFERENTIAL/PLATELET
Basophils Absolute: 0 10*3/uL (ref 0.0–0.1)
Basophils Relative: 0.6 % (ref 0.0–3.0)
Lymphocytes Relative: 34.1 % (ref 12.0–46.0)
Lymphs Abs: 2.1 10*3/uL (ref 0.7–4.0)
MCV: 91.6 fl (ref 78.0–100.0)
Monocytes Absolute: 0.6 10*3/uL (ref 0.1–1.0)
Neutrophils Relative %: 52.4 % (ref 43.0–77.0)
Platelets: 213 10*3/uL (ref 150.0–400.0)
RDW: 12.9 % (ref 11.5–14.6)

## 2010-12-06 NOTE — Patient Instructions (Signed)
Don't change your meds for now.  You can get your results through our phone system.  Follow the instructions on the blue card. Take care.  Good luck with school.

## 2010-12-06 NOTE — Progress Notes (Signed)
Fatigue.  In school at [employer] and has a busy schedule; this has been stressful.  He has napping and this is unusual for him.   The fatigue has been going on for a while, a few weeks.  Mother has h/o anemia.  No FCNAVD.  Appetite has been at baseline.  Weight is stable.  Mood is "fine, but I'm a little more tense"  Overall, "I'm better than last year, away from the last job."  Sleeping okay.  "I don't feel depressed."  He's really focused on school and has pretest anxiety but he's working on relaxation techniques.    He's done well on the nasonex.    He had f/u with ortho about his SI joint and shoulder.  He is doing better with PT.    Meds, vitals, and allergies reviewed.   ROS: See HPI.  Otherwise, noncontributory.  GEN: nad, alert and oriented HEENT: mucous membranes moist NECK: supple w/o LA, no TMG CV: rrr PULM: ctab, no inc wob ABD: soft, +bs, no HSM, normal bs EXT: no edema SKIN: no acute rash

## 2010-12-06 NOTE — Assessment & Plan Note (Addendum)
Check labs.  This may be due to stress with school.  He agrees with plan.  Okay for outpatient f/u.  Nontoxic.

## 2011-03-10 ENCOUNTER — Ambulatory Visit (INDEPENDENT_AMBULATORY_CARE_PROVIDER_SITE_OTHER): Payer: BC Managed Care – PPO | Admitting: Family Medicine

## 2011-03-10 ENCOUNTER — Encounter: Payer: Self-pay | Admitting: Family Medicine

## 2011-03-10 VITALS — BP 112/70 | HR 78 | Temp 98.6°F | Wt 197.1 lb

## 2011-03-10 DIAGNOSIS — Z23 Encounter for immunization: Secondary | ICD-10-CM

## 2011-03-10 DIAGNOSIS — Z789 Other specified health status: Secondary | ICD-10-CM

## 2011-03-10 DIAGNOSIS — R41840 Attention and concentration deficit: Secondary | ICD-10-CM

## 2011-03-10 LAB — COMPREHENSIVE METABOLIC PANEL
ALT: 46
BUN: 9
Creatinine, Ser: 1.1
GFR calc Af Amer: >60
GFR calc non Af Amer: >60
Potassium: 3.9
Sodium: 142

## 2011-03-10 LAB — CBC
HCT: 46.9
MCHC: 35.6
MCV: 89.4
RDW: 12.8
WBC: 5.8

## 2011-03-10 LAB — DIFFERENTIAL
Basophils Absolute: 0
Basophils Relative: 1
Eosinophils Absolute: 0.2
Monocytes Absolute: 0.7

## 2011-03-10 NOTE — Patient Instructions (Signed)
See Shirlee Limerick about your referral before your leave today. I'll let you know after I hear back from Dr. Laymond Purser.   Take care.

## 2011-03-10 NOTE — Progress Notes (Signed)
Stilll with fatigue.  Working part time and taking classes, full load of school.  Prev with HA, frontal and posterior neck pain.  Worse with/associated with cold sx.  Was taking echinacea and goldenseal.    "I don't know if I have ADD."  His work can expand to meet his time constraints.  "I'm easily distracted, even in the middle of work."  His friends are asking about the dx.  He doesn't know if he is making himself busier than he needs to be, and this may have something to do with his fatigue.  Messy room as a child.  "Now I have to clean up my desk before I study."  6 speeding tickets prev.  Doesn't use a grocery list, he is aware of potential for impulse buys.  This all appears to be longstanding. He is distractable during the exam.   Meds, vitals, and allergies reviewed.   ROS: See HPI.  Otherwise, noncontributory.  GEN: nad, alert and oriented HEENT: mucous membranes moist, tm and op wnl NECK: supple w/o LA CV: rrr. PULM: ctab, no inc wob EXT: no edema SKIN: no acute rash

## 2011-03-11 ENCOUNTER — Encounter: Payer: Self-pay | Admitting: Family Medicine

## 2011-03-11 DIAGNOSIS — R41840 Attention and concentration deficit: Secondary | ICD-10-CM

## 2011-03-11 HISTORY — DX: Attention and concentration deficit: R41.840

## 2011-03-11 NOTE — Assessment & Plan Note (Signed)
Possible ADD, refer to psychology for testing.  D/w pt about possible dx and treatment that could potentially include meds.  We need to est dx first.  >25 min spent with face to face with patient, >50% counseling.  Refer and will notify pt after testing done.  He agrees.

## 2011-04-02 ENCOUNTER — Ambulatory Visit (INDEPENDENT_AMBULATORY_CARE_PROVIDER_SITE_OTHER): Payer: BC Managed Care – PPO | Admitting: Psychology

## 2011-04-02 DIAGNOSIS — F988 Other specified behavioral and emotional disorders with onset usually occurring in childhood and adolescence: Secondary | ICD-10-CM

## 2011-04-08 ENCOUNTER — Other Ambulatory Visit (INDEPENDENT_AMBULATORY_CARE_PROVIDER_SITE_OTHER): Payer: BC Managed Care – PPO

## 2011-04-08 DIAGNOSIS — F4323 Adjustment disorder with mixed anxiety and depressed mood: Secondary | ICD-10-CM

## 2011-04-17 ENCOUNTER — Ambulatory Visit (INDEPENDENT_AMBULATORY_CARE_PROVIDER_SITE_OTHER): Payer: BC Managed Care – PPO | Admitting: Psychology

## 2011-04-17 DIAGNOSIS — F4323 Adjustment disorder with mixed anxiety and depressed mood: Secondary | ICD-10-CM

## 2011-05-05 ENCOUNTER — Ambulatory Visit: Payer: BC Managed Care – PPO | Admitting: Family Medicine

## 2011-05-14 ENCOUNTER — Ambulatory Visit (INDEPENDENT_AMBULATORY_CARE_PROVIDER_SITE_OTHER): Payer: BC Managed Care – PPO | Admitting: Family Medicine

## 2011-05-14 ENCOUNTER — Encounter: Payer: Self-pay | Admitting: Family Medicine

## 2011-05-14 DIAGNOSIS — R41840 Attention and concentration deficit: Secondary | ICD-10-CM

## 2011-05-14 DIAGNOSIS — Z789 Other specified health status: Secondary | ICD-10-CM

## 2011-05-14 MED ORDER — ALPRAZOLAM 0.5 MG PO TABS: ORAL_TABLET | ORAL | Status: DC

## 2011-05-14 MED ORDER — CITALOPRAM HYDROBROMIDE 20 MG PO TABS: 20.0000 mg | ORAL_TABLET | Freq: Every day | ORAL | Status: DC

## 2011-05-14 NOTE — Patient Instructions (Addendum)
If you have problems with the citalopram, then stop it and notify the office.  If you are doing well, then call with an update in 1 month.

## 2011-05-14 NOTE — Progress Notes (Signed)
Attention follow up.  He saw Dr. Laymond Purser.  His ADD tests are not conclusive for ADD.  His sx may be related to anxiety.  When the anxiety "kicks in", he gets distracted easily.  He's trying to max out his coping skills. Caffeine excites him, it doesn't focus him.  We talked about dx possibilities: Mild ADD/compensated (episodically) ADD vs. another issue (ie anxiety or other dx).  No Si/Hi.  He does get some relief from PRN BZD.  He uses this infrequently, at low dose, with relief.    Meds, vitals, and allergies reviewed.   ROS: See HPI.  Otherwise, noncontributory.  GEN: nad, alert and oriented HEENT: mucous membranes moist NECK: supple w/o LA CV: rrr PULM: ctab, no inc wob ABD: soft, +bs EXT: no edema SKIN: no acute rash Affect wnl, speech wnl

## 2011-05-15 ENCOUNTER — Encounter: Payer: Self-pay | Admitting: Family Medicine

## 2011-05-15 NOTE — Assessment & Plan Note (Signed)
We talked about this and he doesn't have classic patterns for ADD on testing.  It could be that he has anxiety that then affects his concentration.  We talked about options and it would be reasonable for a trial of SSRI with typical advice given, esp re: timing of relief with these meds.  He agrees, continue prn BZD and then call back as needed, re: improvement or ADE/lack of effect.  He agrees with plan.  Okay for outpatient f/u.  >25 min spent with face to face with patient, >50% counseling and/or coordinating care (in addition to time spent talking with Dr. Laymond Purser about the case).

## 2011-06-03 HISTORY — PX: HERNIA REPAIR: SHX51

## 2011-06-27 ENCOUNTER — Telehealth: Payer: Self-pay | Admitting: *Deleted

## 2011-06-27 NOTE — Telephone Encounter (Signed)
I called pt.  He had the intolerance a few days into the medicine. He stopped it.  He doesn't sound to be anxious or depressed.  He'll stay off meds and will call back as needed. He agrees with plan.

## 2011-06-27 NOTE — Telephone Encounter (Signed)
Celexa is not working.  Did not improve mood, energy, etc.   He said all he wanted to do was stay in bed and sleep.    He said he had looked forward to graduating at [employer] college but that did not excite him at all when it actually happened.  He just wanted to go home and sleep.  He has already discontinued the medication.  Please advise.

## 2011-07-08 ENCOUNTER — Ambulatory Visit (INDEPENDENT_AMBULATORY_CARE_PROVIDER_SITE_OTHER): Payer: BC Managed Care – PPO | Admitting: Family Medicine

## 2011-07-08 ENCOUNTER — Encounter: Payer: Self-pay | Admitting: Family Medicine

## 2011-07-08 VITALS — BP 130/82 | HR 80 | Temp 98.9°F | Wt 201.8 lb

## 2011-07-08 DIAGNOSIS — J329 Chronic sinusitis, unspecified: Secondary | ICD-10-CM

## 2011-07-08 MED ORDER — AZITHROMYCIN 250 MG PO TABS: ORAL_TABLET | ORAL | Status: AC

## 2011-07-08 NOTE — Progress Notes (Signed)
He was having move HA recently.  Was needing to take more ibuprofen than normal.  Now with bloody, yellow rhinorrhea.  Sx going on for about 2 weeks.  Mult sick contacts.  No FCNAV.  Diarrhea likely related to ibuprofen use.  No cough.  Tickle in throat. Better with shower.  Facial pressure.    Meds, vitals, and allergies reviewed.   ROS: See HPI.  Otherwise, noncontributory.  GEN: nad, alert and oriented HEENT: mucous membranes moist, tm w/o erythema, nasal exam w/o erythema, clear discharge noted,  OP with cobblestoning NECK: supple w/o LA CV: rrr.   PULM: ctab, no inc wob EXT: no edema SKIN: no acute rash

## 2011-07-08 NOTE — Patient Instructions (Signed)
Start back on the nasonex until you feel better and and start the antibiotics today.  Use nasal saline daily.  This should get better.

## 2011-07-09 ENCOUNTER — Encounter: Payer: Self-pay | Admitting: Family Medicine

## 2011-07-09 DIAGNOSIS — J329 Chronic sinusitis, unspecified: Secondary | ICD-10-CM | POA: Insufficient documentation

## 2011-07-09 NOTE — Assessment & Plan Note (Signed)
Start abx, restart nasal steroid, f/u prn. Nontoxic. He agrees.

## 2011-07-12 ENCOUNTER — Other Ambulatory Visit: Payer: Self-pay | Admitting: Family Medicine

## 2011-07-14 NOTE — Telephone Encounter (Signed)
Received refill request electronically from pharmacy. Directions were changed to 2 sprays daily 06/12. Is it okay to refill medication?

## 2011-09-04 ENCOUNTER — Encounter: Payer: Self-pay | Admitting: Family Medicine

## 2011-09-04 ENCOUNTER — Ambulatory Visit (INDEPENDENT_AMBULATORY_CARE_PROVIDER_SITE_OTHER): Payer: BC Managed Care – PPO | Admitting: Family Medicine

## 2011-09-04 VITALS — BP 114/84 | HR 72 | Temp 98.7°F | Wt 197.0 lb

## 2011-09-04 DIAGNOSIS — Z711 Person with feared health complaint in whom no diagnosis is made: Secondary | ICD-10-CM

## 2011-09-04 NOTE — Progress Notes (Signed)
Possible abd hernia.  He prev had GB and appy surgery.  He has didn't know if he had a reduecable mass in mid abd.  He noted it again in last few days.  No pain in the area now, prev was slightly sore with repeated manipulation.  No vomiting, diarrhea, fevers.    Meds, vitals, and allergies reviewed.   ROS: See HPI.  Otherwise, noncontributory.  Nad abd not ttp, no rebound. Normal BS.  There are postsurgical changes but no hernia noted.

## 2011-09-04 NOTE — Patient Instructions (Signed)
I would keep an eye on the area and if it is getting bigger or more painful, then let us know.

## 2011-09-05 ENCOUNTER — Encounter: Payer: Self-pay | Admitting: Family Medicine

## 2011-09-05 DIAGNOSIS — Z711 Person with feared health complaint in whom no diagnosis is made: Secondary | ICD-10-CM | POA: Insufficient documentation

## 2011-09-05 DIAGNOSIS — R42 Dizziness and giddiness: Secondary | ICD-10-CM | POA: Insufficient documentation

## 2011-09-05 NOTE — Assessment & Plan Note (Signed)
Appears to be normal postsurgical changes w/o mass or hernia noted.  He could possibly have a hernia, but it would have to be so small that it couldn't be palpated.  No intervention, he'll notify us if the area is painful or if he sees a definite mass.  Anatomy d/w pt.

## 2011-11-26 ENCOUNTER — Encounter: Payer: Self-pay | Admitting: Family Medicine

## 2011-11-26 ENCOUNTER — Ambulatory Visit (INDEPENDENT_AMBULATORY_CARE_PROVIDER_SITE_OTHER): Payer: BC Managed Care – PPO | Admitting: Family Medicine

## 2011-11-26 VITALS — BP 118/82 | HR 76 | Temp 98.7°F | Ht 67.0 in | Wt 203.0 lb

## 2011-11-26 DIAGNOSIS — Z0289 Encounter for other administrative examinations: Secondary | ICD-10-CM

## 2011-11-26 DIAGNOSIS — Z0110 Encounter for examination of ears and hearing without abnormal findings: Secondary | ICD-10-CM

## 2011-11-26 DIAGNOSIS — Z23 Encounter for immunization: Secondary | ICD-10-CM

## 2011-11-26 DIAGNOSIS — Z020 Encounter for examination for admission to educational institution: Secondary | ICD-10-CM

## 2011-11-26 DIAGNOSIS — Z0100 Encounter for examination of eyes and vision without abnormal findings: Secondary | ICD-10-CM

## 2011-11-26 DIAGNOSIS — Z111 Encounter for screening for respiratory tuberculosis: Secondary | ICD-10-CM

## 2011-11-26 LAB — POCT URINALYSIS DIPSTICK
Bilirubin, UA: NEGATIVE
Blood, UA: NEGATIVE
Glucose, UA: NEGATIVE
Ketones, UA: NEGATIVE
Leukocytes, UA: NEGATIVE
Nitrite, UA: NEGATIVE
Spec Grav, UA: >=1.03
Urobilinogen, UA: 0.2
pH, UA: 6

## 2011-11-26 NOTE — Assessment & Plan Note (Addendum)
Vaccinate today- meningococcal and Tdap.  See notes on labs.  If all are wnl, then no reason to hold patient out.  He appears to be healthy for school.  PPD placed today.  >25 min spent with face to face with patient, >50% counseling and/or coordinating care

## 2011-11-26 NOTE — Progress Notes (Signed)
Needs routine pre-school exam.  See scanned forms.   Feeling well w/o acute complaints.  H/o migraines, rare sx.  Doing well with that- had taken very low dose of xanax at start of migraine with effect.  No frequent use.  H/o seasonal allergies controlled.   PMH and SH reviewed  Meds, vitals, and allergies reviewed.   ROS: See HPI.  Otherwise negative.    GEN: nad, alert and oriented HEENT: mucous membranes moist NECK: supple w/o LA CV: rrr. PULM: ctab, no inc wob ABD: soft, +bs EXT: no edema SKIN: no acute rash

## 2011-11-26 NOTE — Patient Instructions (Addendum)
Come back to have your PPD read at 4PM on Friday afternoon.  We'll work on your forms.

## 2011-11-27 LAB — MEASLES/MUMPS/RUBELLA IMMUNITY
Mumps IgG: 1.04 {ISR} — ABNORMAL HIGH
Rubella: 20.7 IU/mL — ABNORMAL HIGH
Rubeola IgG: 3.48 {ISR} — ABNORMAL HIGH

## 2011-11-27 LAB — HEPATITIS B SURFACE ANTIBODY,QUALITATIVE

## 2011-11-27 LAB — VARICELLA ZOSTER ANTIBODY, IGG: Varicella IgG: 2.68 {ISR} — ABNORMAL HIGH

## 2011-11-27 NOTE — Addendum Note (Signed)
Addended by: Joaquim Nam on: 11/27/2011 02:10 PM   Modules accepted: Orders

## 2011-11-28 ENCOUNTER — Other Ambulatory Visit: Payer: Self-pay | Admitting: *Deleted

## 2011-11-28 ENCOUNTER — Ambulatory Visit (INDEPENDENT_AMBULATORY_CARE_PROVIDER_SITE_OTHER): Payer: BC Managed Care – PPO | Admitting: *Deleted

## 2011-11-28 DIAGNOSIS — Z23 Encounter for immunization: Secondary | ICD-10-CM

## 2011-11-28 NOTE — Telephone Encounter (Signed)
Faxed refill request   

## 2011-11-30 MED ORDER — ALPRAZOLAM 0.5 MG PO TABS: ORAL_TABLET | ORAL | Status: DC

## 2011-11-30 NOTE — Telephone Encounter (Signed)
Please call in

## 2011-12-01 NOTE — Telephone Encounter (Signed)
Medication phoned to pharmacy.  

## 2011-12-30 ENCOUNTER — Ambulatory Visit (INDEPENDENT_AMBULATORY_CARE_PROVIDER_SITE_OTHER): Payer: BC Managed Care – PPO

## 2011-12-30 DIAGNOSIS — Z23 Encounter for immunization: Secondary | ICD-10-CM

## 2012-01-02 ENCOUNTER — Telehealth (INDEPENDENT_AMBULATORY_CARE_PROVIDER_SITE_OTHER): Payer: Self-pay | Admitting: General Surgery

## 2012-01-02 NOTE — Telephone Encounter (Signed)
Please call pt he wanted to come back in to see Dr Janee Morn to reck hernia site/pt had hernia surgery a year ago and now feels another hernia. Need appt please call back on [phone]

## 2012-01-06 ENCOUNTER — Telehealth: Payer: Self-pay

## 2012-01-06 DIAGNOSIS — J310 Chronic rhinitis: Secondary | ICD-10-CM

## 2012-01-06 NOTE — Telephone Encounter (Signed)
Pt having ongoing sinus problems;also dizziness. Does not feel like normal allergy kind of sinus and pt request ENT referral.Please advise.

## 2012-01-07 NOTE — Telephone Encounter (Signed)
Ordered

## 2012-01-14 ENCOUNTER — Ambulatory Visit (INDEPENDENT_AMBULATORY_CARE_PROVIDER_SITE_OTHER): Payer: BC Managed Care – PPO | Admitting: General Surgery

## 2012-01-14 ENCOUNTER — Encounter (INDEPENDENT_AMBULATORY_CARE_PROVIDER_SITE_OTHER): Payer: Self-pay | Admitting: General Surgery

## 2012-01-14 VITALS — BP 136/94 | HR 77 | Temp 97.8°F | Ht 68.0 in | Wt 202.0 lb

## 2012-01-14 DIAGNOSIS — K432 Incisional hernia without obstruction or gangrene: Secondary | ICD-10-CM

## 2012-01-14 NOTE — Progress Notes (Signed)
Patient ID: [name], male   DOB: 1970/09/25, 41 y.o.   MRN: [id number]  Chief Complaint  Patient presents with  . Pre-op Exam    eval hernia    HPI [name] is a 41 y.o. male.  Incisional hernia HPI Patient underwent laparoscopic appendectomy a year and a half ago. During that time an incision was made above his umbilicus due to previous umbilical hernia repair in the past. He has developed a small bulge beneath his incision. It comes and goes. When he lays down it goes away. It is intermittently painful. He has some strange sensations when he is able to reduce some tissue there. No change in bowel function. No overlying skin changes. Past Medical History  Diagnosis Date  . History of cholecystectomy   . Nephrolithiasis   . Headache     Migraine  . Allergy   . Chickenpox   . Pancreatitis   . Dyskinesia   . Ulnar neuropathy     mild symptoms   . 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 Anxiety     Past Surgical History  Procedure Date  . Gallbladder surgery 2008  . Hernia repair 2011    repair from gallbladder surgery  . Lithotripsy 2000  . Appendectomy   . Cholecystectomy     Family History  Problem Relation Age of Onset  . Cancer Mother     gyn cancer, pt didn't know source ovarian    Social History History  Substance Use Topics  . Smoking status: Never Smoker   . Smokeless tobacco: Never Used  . Alcohol Use: Yes     1 glass of wine/month    Allergies  Allergen Reactions  . Cephalexin     REACTION: rash  . Citalopram Hydrobromide     Sedation, lack of motivation  . Codeine     REACTION: nausea  . Cyclobenzaprine Hcl     REACTION: sedation  . Effexor (Venlafaxine Hydrochloride)     Nausea, intolerant  . Morphine     REACTION: vomiting  . Sulfonamide Derivatives     REACTION: rash  . Topiramate     REACTION: intolerant of dose of 75mg  or higher, tolerates 50mg  dose.    Current Outpatient Prescriptions  Medication Sig  Dispense Refill  . ALPRAZolam (XANAX) 0.5 MG tablet Take 1/4 to 1/2 tablet by mouth as needed.  30 tablet  0  . ibuprofen (ADVIL,MOTRIN) 200 MG tablet Take 200 mg by mouth as needed.        . mometasone (NASONEX) 50 MCG/ACT nasal spray Place 2 sprays into the nose daily.  17 g  12  . NON FORMULARY Airborne As needed.         Review of Systems Review of Systems  Constitutional: Negative for fever, chills and unexpected weight change.  HENT: Negative for hearing loss, congestion, sore throat, trouble swallowing and voice change.   Eyes: Negative for visual disturbance.  Respiratory: Negative for cough and wheezing.   Cardiovascular: Negative for chest pain, palpitations and leg swelling.  Gastrointestinal: Negative for nausea, vomiting, abdominal pain, diarrhea, constipation, blood in stool, abdominal distention, anal bleeding and rectal pain.       Refer History of present illness  Genitourinary: Negative for hematuria and difficulty urinating.  Musculoskeletal: Negative for arthralgias.  Skin: Negative for rash and wound.  Neurological: Negative for seizures, syncope, weakness and headaches.  Hematological: Negative for adenopathy. Does not bruise/bleed easily.  Psychiatric/Behavioral: Negative for confusion.    Blood pressure 136/94, pulse 77, temperature 97.8 F (36.6 C), temperature source Temporal, height 5\' 8"  (1.727 m), weight 202 lb (91.627 kg), SpO2 98.00%.  Physical Exam Physical Exam  Constitutional: He is oriented to person, place, and time. He appears well-developed and well-nourished.  HENT:  Head: Normocephalic and atraumatic.  Neck: Normal range of motion. Neck supple.  Cardiovascular: Normal rate and intact distal pulses.   Pulmonary/Chest: Effort normal and breath sounds normal. No respiratory distress. He has no wheezes. He has no rales.  Abdominal: Soft. He exhibits no distension.         Reducible incisional hernia beneath the supraumbilical incision.  Fascial defect approximately 1 cm. No significant tenderness with examination.  Musculoskeletal: Normal range of motion.  Neurological: He is alert and oriented to person, place, and time.    Data Reviewed n/a  Assessment    Incisional hernia    Plan    I offered incisional hernia repair with mesh as an outpatient procedure. Procedure, risks, and benefits were discussed in detail with the patient he is well informed. I answered his questions. I look forward to scheduled this electively to coincide with his school schedule. He may continue normal activities at this time. He will need to avoid heavy lifting for a total of 6 weeks after surgery. I also told him to hold his Advil for 5 days prior to surgery.       Marua Qin E 01/14/2012, 10:36 AM

## 2012-01-27 ENCOUNTER — Telehealth (INDEPENDENT_AMBULATORY_CARE_PROVIDER_SITE_OTHER): Payer: Self-pay

## 2012-01-27 NOTE — Telephone Encounter (Signed)
Pt returned my call and he does not need the Thursday appointment.  I will cancel it.

## 2012-01-27 NOTE — Telephone Encounter (Signed)
I called and left the pt a message to call me so I can see if he still needs his appointment for Thursday.  He was seen 8/14.

## 2012-01-29 ENCOUNTER — Encounter (INDEPENDENT_AMBULATORY_CARE_PROVIDER_SITE_OTHER): Payer: Self-pay | Admitting: General Surgery

## 2012-02-18 ENCOUNTER — Ambulatory Visit (INDEPENDENT_AMBULATORY_CARE_PROVIDER_SITE_OTHER): Payer: BC Managed Care – PPO

## 2012-02-18 DIAGNOSIS — Z23 Encounter for immunization: Secondary | ICD-10-CM

## 2012-03-14 ENCOUNTER — Encounter: Payer: Self-pay | Admitting: Family Medicine

## 2012-03-14 DIAGNOSIS — J329 Chronic sinusitis, unspecified: Secondary | ICD-10-CM | POA: Insufficient documentation

## 2012-03-31 ENCOUNTER — Encounter: Payer: Self-pay | Admitting: Family Medicine

## 2012-04-09 ENCOUNTER — Ambulatory Visit (HOSPITAL_BASED_OUTPATIENT_CLINIC_OR_DEPARTMENT_OTHER): Admit: 2012-04-09 | Payer: Self-pay | Admitting: General Surgery

## 2012-04-09 ENCOUNTER — Encounter (HOSPITAL_BASED_OUTPATIENT_CLINIC_OR_DEPARTMENT_OTHER): Payer: Self-pay

## 2012-04-09 SURGERY — REPAIR, HERNIA, INCISIONAL
Anesthesia: General

## 2012-05-31 ENCOUNTER — Telehealth: Payer: Self-pay | Admitting: Family Medicine

## 2012-05-31 NOTE — Telephone Encounter (Signed)
Pt left vm requesting that flu vaccine administration be sent to [facility].  Immunization history record faxed to [phone].

## 2012-06-01 ENCOUNTER — Ambulatory Visit (INDEPENDENT_AMBULATORY_CARE_PROVIDER_SITE_OTHER): Payer: BC Managed Care – PPO | Admitting: *Deleted

## 2012-06-01 DIAGNOSIS — Z23 Encounter for immunization: Secondary | ICD-10-CM

## 2012-08-09 ENCOUNTER — Ambulatory Visit (INDEPENDENT_AMBULATORY_CARE_PROVIDER_SITE_OTHER): Payer: BC Managed Care – HMO | Admitting: Family Medicine

## 2012-08-09 ENCOUNTER — Encounter: Payer: Self-pay | Admitting: Family Medicine

## 2012-08-09 VITALS — BP 122/90 | HR 69 | Temp 98.7°F | Wt 198.0 lb

## 2012-08-09 DIAGNOSIS — J329 Chronic sinusitis, unspecified: Secondary | ICD-10-CM

## 2012-08-09 MED ORDER — ALPRAZOLAM 0.5 MG PO TABS: ORAL_TABLET | ORAL | Status: DC

## 2012-08-09 MED ORDER — DOXYCYCLINE HYCLATE 100 MG PO TABS: 100.0000 mg | ORAL_TABLET | Freq: Two times a day (BID) | ORAL | Status: DC

## 2012-08-09 NOTE — Progress Notes (Signed)
Sx started about 1 month ago.  Head cold initially, now with postnasal gtt and throat irritation.  Bloody nasal discharge on L side.  Pressure and pain on L max sinus more than anywhere else.  Prev fevers with the head cold have resolved.  He felt a little achy prev.  No ear pain but he has some ringing on the L side.  No cough but some throat clearing.  No vomiting.  Had a flu shot.    Overall he is doing well in school.  He rarely uses xanax.  Needs a refill.    Meds, vitals, and allergies reviewed.   ROS: See HPI.  Otherwise, noncontributory.  GEN: nad, alert and oriented HEENT: mucous membranes moist, tm w/o erythema, nasal exam w/o erythema, clear discharge noted,  OP with cobblestoning, L max sinus mildly ttp NECK: supple w/o LA CV: rrr.   PULM: ctab, no inc wob EXT: no edema

## 2012-08-09 NOTE — Patient Instructions (Addendum)
Start the doxycycline today and get some rest.  Drink plenty of fluids.  This should gradually improve.

## 2012-08-10 NOTE — Assessment & Plan Note (Signed)
With likely acute flare. Nontoxic.  Would start doxy today, supportive tx o/w and f/u prn.  He agrees.

## 2012-12-29 ENCOUNTER — Ambulatory Visit (INDEPENDENT_AMBULATORY_CARE_PROVIDER_SITE_OTHER): Payer: BC Managed Care – HMO | Admitting: Family Medicine

## 2012-12-29 ENCOUNTER — Encounter: Payer: Self-pay | Admitting: Family Medicine

## 2012-12-29 VITALS — BP 114/74 | HR 76 | Temp 98.2°F | Wt 201.8 lb

## 2012-12-29 DIAGNOSIS — J329 Chronic sinusitis, unspecified: Secondary | ICD-10-CM

## 2012-12-29 MED ORDER — AZITHROMYCIN 250 MG PO TABS: ORAL_TABLET | ORAL | Status: DC

## 2012-12-29 NOTE — Progress Notes (Signed)
duration of symptoms: about 5 days ago.  Sick contacts. This was the second episode recently ill.  Fatigue and HA recently noted, sinus pressure.  Red/discolored rhinorrhea today.  Nausea recently.  Sx worse with position change.  Rhinorrhea:yes congestion:yes ear pain: no but ears feel stuffy.  sore throat:no cough:no myalgias:no other concerns: working at [employer] in [place].   Dizzy sensation was troubling to him.   No fevers.    ROS: See HPI.  Otherwise negative.    Meds, vitals, and allergies reviewed.   GEN: nad, alert and oriented HEENT: mucous membranes moist, TM w/o erythema but SOM noted B, nasal epithelium injected, OP with cobblestoning, sinuses ttp x4, frontal>maxillary NECK: supple w/o LA CV: rrr. PULM: ctab, no inc wob ABD: soft, +bs EXT: no edema

## 2012-12-29 NOTE — Assessment & Plan Note (Signed)
With acute flare.  Would start zmax, nontoxic. F/u prn.  He agrees.

## 2012-12-29 NOTE — Patient Instructions (Addendum)
Drink plenty of fluids, take tylenol as needed, and gargle with warm salt water for your throat if needed.  Start the antibiotics today.  This should gradually improve.  Take care.  Let us know if you have other concerns.

## 2013-04-07 ENCOUNTER — Other Ambulatory Visit: Payer: Self-pay

## 2013-04-21 ENCOUNTER — Other Ambulatory Visit: Payer: Self-pay | Admitting: Family Medicine

## 2013-04-21 NOTE — Telephone Encounter (Signed)
Electronic refill request.  Please advise. 

## 2013-04-21 NOTE — Telephone Encounter (Signed)
Please call in

## 2013-04-22 ENCOUNTER — Other Ambulatory Visit: Payer: Self-pay | Admitting: Family Medicine

## 2013-04-22 NOTE — Telephone Encounter (Signed)
Medication phoned to pharmacy.  

## 2013-07-22 ENCOUNTER — Encounter: Payer: Self-pay | Admitting: Family Medicine

## 2013-07-22 ENCOUNTER — Ambulatory Visit (INDEPENDENT_AMBULATORY_CARE_PROVIDER_SITE_OTHER): Payer: BC Managed Care – HMO | Admitting: Family Medicine

## 2013-07-22 VITALS — BP 122/78 | HR 83 | Temp 98.7°F | Wt 204.0 lb

## 2013-07-22 DIAGNOSIS — J329 Chronic sinusitis, unspecified: Secondary | ICD-10-CM

## 2013-07-22 MED ORDER — AZITHROMYCIN 250 MG PO TABS
ORAL_TABLET | ORAL | Status: DC
Start: 2013-07-22 — End: 2013-10-14

## 2013-07-22 NOTE — Assessment & Plan Note (Signed)
With an acute flare.  Restart zmax and f/u prn.  Nontoxic.  He agrees.

## 2013-07-22 NOTE — Progress Notes (Signed)
Pre visit review using our clinic review tool, if applicable. No additional management support is needed unless otherwise documented below in the visit note.  duration of symptoms: about 1 month ago, not improved in the meantime.   Rhinorrhea: no congestion:yes ear pain:yes, ringing, congested.   sore throat:some Cough:some, from post nasal gtt myalgias:yes other concerns: he had ENT f/u and was a candidate for surgery.  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   Taking advil w/o much relief.   On flonase at baseline.    ROS: See HPI.  Otherwise negative.    Meds, vitals, and allergies reviewed.    GEN: nad, alert and oriented HEENT: mucous membranes moist, TM w/o erythema, nasal epithelium injected with crusted blood in L>R nasal passage, OP with cobblestoning, max>frontal sinus tenderness.   NECK: supple w/o LA CV: rrr. PULM: ctab, no inc wob ABD: soft, +bs EXT: no edema

## 2013-07-22 NOTE — Patient Instructions (Addendum)
Start the antibiotics and keep using the flonase and nasal saline in the meantime.  Take care.  Glad to see you.

## 2013-09-12 ENCOUNTER — Other Ambulatory Visit: Payer: Self-pay | Admitting: Family Medicine

## 2013-09-12 NOTE — Telephone Encounter (Signed)
Please call in.  Thanks.   

## 2013-09-12 NOTE — Telephone Encounter (Signed)
Received refill request electronically.  Last refill 04/21/13 #30, last office visit 07/22/13. Is it okay to refill medication?

## 2013-09-13 NOTE — Telephone Encounter (Signed)
Medication phoned to pharmacy.  

## 2013-10-14 ENCOUNTER — Encounter: Payer: Self-pay | Admitting: Family Medicine

## 2013-10-14 ENCOUNTER — Ambulatory Visit (INDEPENDENT_AMBULATORY_CARE_PROVIDER_SITE_OTHER): Payer: BC Managed Care – HMO | Admitting: Family Medicine

## 2013-10-14 VITALS — BP 142/86 | HR 88 | Temp 99.0°F | Wt 201.8 lb

## 2013-10-14 DIAGNOSIS — J329 Chronic sinusitis, unspecified: Secondary | ICD-10-CM

## 2013-10-14 MED ORDER — AZITHROMYCIN 250 MG PO TABS: ORAL_TABLET | ORAL | Status: DC

## 2013-10-14 NOTE — Progress Notes (Signed)
Pre visit review using our clinic review tool, if applicable. No additional management support is needed unless otherwise documented below in the visit note.  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  Started at [employer] this past Monday.  In the meantime had a fever, then had a hive reactions to minocycline. Allergy list updated. Started on prednisone after being seen at [facility].  A few doses of prednisone left.  He still feels lousy, cough persisting, occ dry, occ wet.  Post nasal gtt. Voice is still off, sounds congested.  White sputum, no discoloration.  He had an rx for tussionex but didn't feel like he could cough much up.  Some sinus pain.  Off work this weekend.  Off today.    Mother was admitted with GIB, had a variceal rupture, banded.  She is extubated now.  He is clearly worried about her and he is fatigued.    Meds, vitals, and allergies reviewed.   ROS: See HPI.  Otherwise, noncontributory.  GEN: nad, alert and oriented HEENT: mucous membranes moist, tm w/o erythema, nasal exam w/o erythema, clear discharge noted,  OP with cobblestoning, max sinuses ttp NECK: supple w/o LA CV: rrr.   PULM: ctab, no inc wob EXT: no edema SKIN: no acute rash

## 2013-10-14 NOTE — Patient Instructions (Signed)
Add on the zithrmax and this should get better. Take care.

## 2013-10-15 NOTE — Assessment & Plan Note (Signed)
Likely with another flare. Start zmax, supportive tx o/w.  Nontoxic.  D/w pt. He agrees.

## 2013-11-07 ENCOUNTER — Ambulatory Visit (INDEPENDENT_AMBULATORY_CARE_PROVIDER_SITE_OTHER): Payer: BC Managed Care – HMO | Admitting: Family Medicine

## 2013-11-07 ENCOUNTER — Encounter: Payer: Self-pay | Admitting: Family Medicine

## 2013-11-07 VITALS — BP 128/82 | HR 104 | Temp 98.8°F | Wt 196.8 lb

## 2013-11-07 DIAGNOSIS — J329 Chronic sinusitis, unspecified: Secondary | ICD-10-CM

## 2013-11-07 MED ORDER — CLINDAMYCIN HCL 300 MG PO CAPS: 300.0000 mg | ORAL_CAPSULE | Freq: Three times a day (TID) | ORAL | Status: DC

## 2013-11-07 NOTE — Assessment & Plan Note (Signed)
He is awaiting surgery, and that sounds to be the most reasonable long term options.  Worth changing to clinda in the meantime, given mult courses of zmax. Nontoxic.  Okay for outpatient f/u.  He agrees. D/w pt.

## 2013-11-07 NOTE — Progress Notes (Signed)
Pre visit review using our clinic review tool, if applicable. No additional management support is needed unless otherwise documented below in the visit note.  He got better from the prev illness and then his sx returned.  He is still using nasal saline.  He is still pending for surgery- per patient Dr. Wilburn Cornelia was planning a turbinate reduction. He has more sinus pressure recently.  Yellow nasal discharge.  HA worsening in the meantime.  No fevers.  Some ear pain, some tinnitus.  Ears feel stuffy.  Taking zyrtec daily.    He is anticipating his surgery later in the fall of this year.    Meds, vitals, and allergies reviewed.   ROS: See HPI.  Otherwise, noncontributory.  GEN: nad, alert and oriented HEENT: mucous membranes moist, tm w/o erythema, nasal exam w/o erythema, clear discharge noted,  OP with cobblestoning, L frontal sinus ttp, sinuses not ttp o/w.  NECK: supple w/o LA CV: rrr.   PULM: ctab, no inc wob EXT: no edema SKIN: no acute rash

## 2013-11-07 NOTE — Patient Instructions (Signed)
Start clindamycin, use the nasal saline, and this should improved.  Take care.

## 2014-01-19 ENCOUNTER — Encounter (HOSPITAL_BASED_OUTPATIENT_CLINIC_OR_DEPARTMENT_OTHER): Payer: Self-pay | Admitting: *Deleted

## 2014-01-20 ENCOUNTER — Encounter (HOSPITAL_BASED_OUTPATIENT_CLINIC_OR_DEPARTMENT_OTHER): Payer: Self-pay | Admitting: *Deleted

## 2014-01-20 NOTE — Progress Notes (Signed)
No labs needed

## 2014-01-25 ENCOUNTER — Encounter (HOSPITAL_BASED_OUTPATIENT_CLINIC_OR_DEPARTMENT_OTHER): Payer: 59 | Admitting: Anesthesiology

## 2014-01-25 ENCOUNTER — Encounter (HOSPITAL_BASED_OUTPATIENT_CLINIC_OR_DEPARTMENT_OTHER)
Admission: RE | Disposition: A | Discharge status: Home / Self Care | Payer: Self-pay | Source: Ambulatory Visit | Attending: Otolaryngology

## 2014-01-25 ENCOUNTER — Encounter (HOSPITAL_BASED_OUTPATIENT_CLINIC_OR_DEPARTMENT_OTHER): Payer: Self-pay | Admitting: *Deleted

## 2014-01-25 ENCOUNTER — Ambulatory Visit (HOSPITAL_BASED_OUTPATIENT_CLINIC_OR_DEPARTMENT_OTHER)
Admission: RE | Admit: 2014-01-25 | Discharge: 2014-01-25 | Disposition: A | Discharge status: Home / Self Care | Payer: 59 | Source: Ambulatory Visit | Attending: Otolaryngology | Admitting: Otolaryngology

## 2014-01-25 ENCOUNTER — Ambulatory Visit (HOSPITAL_BASED_OUTPATIENT_CLINIC_OR_DEPARTMENT_OTHER): Payer: 59 | Admitting: Anesthesiology

## 2014-01-25 DIAGNOSIS — Z888 Allergy status to other drugs, medicaments and biological substances status: Secondary | ICD-10-CM | POA: Diagnosis not present

## 2014-01-25 DIAGNOSIS — G43909 Migraine, unspecified, not intractable, without status migrainosus: Secondary | ICD-10-CM | POA: Diagnosis not present

## 2014-01-25 DIAGNOSIS — J342 Deviated nasal septum: Secondary | ICD-10-CM

## 2014-01-25 DIAGNOSIS — F411 Generalized anxiety disorder: Secondary | ICD-10-CM | POA: Diagnosis not present

## 2014-01-25 DIAGNOSIS — Z882 Allergy status to sulfonamides status: Secondary | ICD-10-CM | POA: Insufficient documentation

## 2014-01-25 DIAGNOSIS — Z79899 Other long term (current) drug therapy: Secondary | ICD-10-CM | POA: Diagnosis not present

## 2014-01-25 DIAGNOSIS — Z885 Allergy status to narcotic agent status: Secondary | ICD-10-CM | POA: Diagnosis not present

## 2014-01-25 DIAGNOSIS — G5620 Lesion of ulnar nerve, unspecified upper limb: Secondary | ICD-10-CM | POA: Diagnosis not present

## 2014-01-25 DIAGNOSIS — Z881 Allergy status to other antibiotic agents status: Secondary | ICD-10-CM | POA: Diagnosis not present

## 2014-01-25 HISTORY — DX: Deviated nasal septum: J34.2

## 2014-01-25 HISTORY — PX: NASAL SEPTOPLASTY W/ TURBINOPLASTY: SHX2070

## 2014-01-25 HISTORY — DX: Other complications of anesthesia, initial encounter: T88.59XA

## 2014-01-25 HISTORY — DX: Adverse effect of unspecified anesthetic, initial encounter: T41.45XA

## 2014-01-25 LAB — POCT HEMOGLOBIN-HEMACUE: HEMOGLOBIN: 16.2 g/dL (ref 13.0–17.0)

## 2014-01-25 SURGERY — SEPTOPLASTY, NOSE, WITH NASAL TURBINATE REDUCTION
Anesthesia: General | Site: Nose | Laterality: Bilateral

## 2014-01-25 MED ORDER — FENTANYL CITRATE 0.05 MG/ML IJ SOLN: 50.0000 ug | INTRAMUSCULAR | Status: DC | PRN

## 2014-01-25 MED ORDER — HYDROMORPHONE HCL PF 1 MG/ML IJ SOLN
0.2500 mg | INTRAMUSCULAR | Status: DC | PRN
  Administered 2014-01-25: 0.5 mg via INTRAVENOUS

## 2014-01-25 MED ORDER — MIDAZOLAM HCL 2 MG/2ML IJ SOLN
INTRAMUSCULAR | Status: AC
Start: 2014-01-25 — End: 2014-01-25
  Filled 2014-01-25: qty 2

## 2014-01-25 MED ORDER — ACETAMINOPHEN 500 MG PO TABS
1000.0000 mg | ORAL_TABLET | Freq: Once | ORAL | Status: AC
  Administered 2014-01-25: 1000 mg via ORAL

## 2014-01-25 MED ORDER — OXYCODONE HCL 5 MG PO TABS: 5.0000 mg | ORAL_TABLET | Freq: Once | ORAL | Status: DC | PRN

## 2014-01-25 MED ORDER — OXYMETAZOLINE HCL 0.05 % NA SOLN
NASAL | Status: DC | PRN
  Administered 2014-01-25: 1 via NASAL

## 2014-01-25 MED ORDER — MUPIROCIN 2 % EX OINT
TOPICAL_OINTMENT | CUTANEOUS | Status: AC
  Filled 2014-01-25: qty 22

## 2014-01-25 MED ORDER — FENTANYL CITRATE 0.05 MG/ML IJ SOLN
INTRAMUSCULAR | Status: DC | PRN
  Administered 2014-01-25 (×3): 50 ug via INTRAVENOUS

## 2014-01-25 MED ORDER — ACETAMINOPHEN 500 MG PO TABS
ORAL_TABLET | ORAL | Status: AC
  Filled 2014-01-25: qty 2

## 2014-01-25 MED ORDER — HYDROMORPHONE HCL PF 1 MG/ML IJ SOLN
INTRAMUSCULAR | Status: AC
  Filled 2014-01-25: qty 1

## 2014-01-25 MED ORDER — DEXAMETHASONE SODIUM PHOSPHATE 4 MG/ML IJ SOLN
INTRAMUSCULAR | Status: DC | PRN
  Administered 2014-01-25: 10 mg via INTRAVENOUS

## 2014-01-25 MED ORDER — CIPROFLOXACIN-DEXAMETHASONE 0.3-0.1 % OT SUSP
OTIC | Status: AC
  Filled 2014-01-25: qty 7.5

## 2014-01-25 MED ORDER — PROPOFOL 10 MG/ML IV BOLUS
INTRAVENOUS | Status: DC | PRN
  Administered 2014-01-25: 200 mg via INTRAVENOUS

## 2014-01-25 MED ORDER — CLINDAMYCIN PHOSPHATE 600 MG/50ML IV SOLN
600.0000 mg | Freq: Once | INTRAVENOUS | Status: AC
  Administered 2014-01-25: 600 mg via INTRAVENOUS

## 2014-01-25 MED ORDER — HYDROCODONE-ACETAMINOPHEN 5-325 MG PO TABS: 1.0000 | ORAL_TABLET | Freq: Four times a day (QID) | ORAL | Status: DC | PRN

## 2014-01-25 MED ORDER — ONDANSETRON HCL 4 MG/2ML IJ SOLN
INTRAMUSCULAR | Status: DC | PRN
  Administered 2014-01-25: 4 mg via INTRAVENOUS

## 2014-01-25 MED ORDER — CLINDAMYCIN PHOSPHATE 600 MG/50ML IV SOLN
INTRAVENOUS | Status: AC
  Filled 2014-01-25: qty 50

## 2014-01-25 MED ORDER — ONDANSETRON HCL 4 MG/2ML IJ SOLN: 4.0000 mg | Freq: Once | INTRAMUSCULAR | Status: DC | PRN

## 2014-01-25 MED ORDER — CLINDAMYCIN HCL 300 MG PO CAPS: 300.0000 mg | ORAL_CAPSULE | Freq: Three times a day (TID) | ORAL | Status: DC

## 2014-01-25 MED ORDER — LIDOCAINE-EPINEPHRINE 1 %-1:100000 IJ SOLN
INTRAMUSCULAR | Status: AC
  Filled 2014-01-25: qty 1

## 2014-01-25 MED ORDER — PROPOFOL 10 MG/ML IV BOLUS
INTRAVENOUS | Status: AC
  Filled 2014-01-25: qty 20

## 2014-01-25 MED ORDER — OXYCODONE HCL 5 MG/5ML PO SOLN: 5.0000 mg | Freq: Once | ORAL | Status: DC | PRN

## 2014-01-25 MED ORDER — SCOPOLAMINE 1 MG/3DAYS TD PT72
1.0000 | MEDICATED_PATCH | TRANSDERMAL | Status: DC
  Administered 2014-01-25: 1.5 mg via TRANSDERMAL

## 2014-01-25 MED ORDER — SUCCINYLCHOLINE CHLORIDE 20 MG/ML IJ SOLN
INTRAMUSCULAR | Status: DC | PRN
  Administered 2014-01-25: 100 mg via INTRAVENOUS

## 2014-01-25 MED ORDER — FENTANYL CITRATE 0.05 MG/ML IJ SOLN
INTRAMUSCULAR | Status: AC
  Filled 2014-01-25: qty 6

## 2014-01-25 MED ORDER — LACTATED RINGERS IV SOLN
INTRAVENOUS | Status: DC
  Administered 2014-01-25 (×3): via INTRAVENOUS

## 2014-01-25 MED ORDER — MUPIROCIN 2 % EX OINT
TOPICAL_OINTMENT | CUTANEOUS | Status: DC | PRN
  Administered 2014-01-25: 1 via NASAL

## 2014-01-25 MED ORDER — LIDOCAINE-EPINEPHRINE 1 %-1:100000 IJ SOLN
INTRAMUSCULAR | Status: DC | PRN
  Administered 2014-01-25: 7 mL

## 2014-01-25 MED ORDER — OXYMETAZOLINE HCL 0.05 % NA SOLN
NASAL | Status: AC
Start: 2014-01-25 — End: 2014-01-25
  Filled 2014-01-25: qty 15

## 2014-01-25 MED ORDER — SCOPOLAMINE 1 MG/3DAYS TD PT72
MEDICATED_PATCH | TRANSDERMAL | Status: AC
  Filled 2014-01-25: qty 1

## 2014-01-25 MED ORDER — MIDAZOLAM HCL 5 MG/5ML IJ SOLN
INTRAMUSCULAR | Status: DC | PRN
  Administered 2014-01-25: 2 mg via INTRAVENOUS

## 2014-01-25 MED ORDER — LIDOCAINE HCL (CARDIAC) 10 MG/ML IV SOLN
INTRAVENOUS | Status: DC | PRN
  Administered 2014-01-25: 60 mg via INTRAVENOUS

## 2014-01-25 MED ORDER — BACITRACIN ZINC 500 UNIT/GM EX OINT
TOPICAL_OINTMENT | CUTANEOUS | Status: AC
  Filled 2014-01-25: qty 28.35

## 2014-01-25 MED ORDER — MIDAZOLAM HCL 2 MG/2ML IJ SOLN: 1.0000 mg | INTRAMUSCULAR | Status: DC | PRN

## 2014-01-25 SURGICAL SUPPLY — 29 items
ATTRACTOMAT 16X20 MAGNETIC DRP (DRAPES) ×2 IMPLANT
BLADE SURG 15 STRL LF DISP TIS (BLADE) ×1 IMPLANT
BLADE SURG 15 STRL SS (BLADE) ×1
CANISTER SUCT 1200ML W/VALVE (MISCELLANEOUS) ×2 IMPLANT
COAGULATOR SUCT 8FR VV (MISCELLANEOUS) ×2 IMPLANT
DECANTER SPIKE VIAL GLASS SM (MISCELLANEOUS) IMPLANT
DRSG NASOPORE 8CM (GAUZE/BANDAGES/DRESSINGS) IMPLANT
DRSG TELFA 3X8 NADH (GAUZE/BANDAGES/DRESSINGS) IMPLANT
ELECT REM PT RETURN 9FT ADLT (ELECTROSURGICAL) ×2
ELECTRODE REM PT RTRN 9FT ADLT (ELECTROSURGICAL) ×1 IMPLANT
GLOVE BIOGEL M 7.0 STRL (GLOVE) ×4 IMPLANT
GOWN STRL REUS W/ TWL LRG LVL3 (GOWN DISPOSABLE) ×2 IMPLANT
GOWN STRL REUS W/TWL LRG LVL3 (GOWN DISPOSABLE) ×2
NEEDLE 27GAX1X1/2 (NEEDLE) ×2 IMPLANT
NS IRRIG 1000ML POUR BTL (IV SOLUTION) ×2 IMPLANT
PACK BASIN DAY SURGERY FS (CUSTOM PROCEDURE TRAY) ×2 IMPLANT
PACK ENT DAY SURGERY (CUSTOM PROCEDURE TRAY) ×2 IMPLANT
SET EXT MALE ROTATING LL 32IN (MISCELLANEOUS) ×2 IMPLANT
SLEEVE SCD COMPRESS KNEE MED (MISCELLANEOUS) ×2 IMPLANT
SPLINT NASAL AIRWAY SILICONE (MISCELLANEOUS) ×2 IMPLANT
SPONGE GAUZE 2X2 8PLY STRL LF (GAUZE/BANDAGES/DRESSINGS) ×2 IMPLANT
SPONGE NEURO XRAY DETECT 1X3 (DISPOSABLE) ×2 IMPLANT
SPONGE SURGIFOAM ABS GEL 12-7 (HEMOSTASIS) IMPLANT
SUT ETHILON 3 0 PS 1 (SUTURE) ×2 IMPLANT
SUT PLAIN 4 0 ~~LOC~~ 1 (SUTURE) ×2 IMPLANT
TOWEL OR 17X24 6PK STRL BLUE (TOWEL DISPOSABLE) ×2 IMPLANT
TUBE SALEM SUMP 12R W/ARV (TUBING) IMPLANT
TUBE SALEM SUMP 16 FR W/ARV (TUBING) ×2 IMPLANT
YANKAUER SUCT BULB TIP NO VENT (SUCTIONS) ×2 IMPLANT

## 2014-01-25 NOTE — Anesthesia Postprocedure Evaluation (Signed)
  Anesthesia Post-op Note  Patient: [name]  Procedure(s) Performed: Procedure(s): NASAL SEPTOPLASTY/BILATERAL INFERIOR TURBINATE REDUCTION (Bilateral)  Patient Location: PACU  Anesthesia Type: General   Level of Consciousness: awake, alert  and oriented  Airway and Oxygen Therapy: Patient Spontanous Breathing  Post-op Pain: mild  Post-op Assessment: Post-op Vital signs reviewed  Post-op Vital Signs: Reviewed  Last Vitals:  Filed Vitals:   01/25/14 1100  BP: 110/73  Pulse: 64  Temp:   Resp: 11    Complications: No apparent anesthesia complications

## 2014-01-25 NOTE — Anesthesia Procedure Notes (Signed)
Procedure Name: Intubation Date/Time: 01/25/2014 9:25 AM Performed by: Lyndee Leo Pre-anesthesia Checklist: Patient identified, Emergency Drugs available, Suction available and Patient being monitored Patient Re-evaluated:Patient Re-evaluated prior to inductionOxygen Delivery Method: Circle System Utilized Preoxygenation: Pre-oxygenation with 100% oxygen Intubation Type: IV induction Ventilation: Mask ventilation without difficulty Laryngoscope Size: Miller and 3 Grade View: Grade II Tube type: Oral Rae Tube size: 8.0 mm Number of attempts: 1 Airway Equipment and Method: stylet and oral airway Placement Confirmation: ETT inserted through vocal cords under direct vision,  positive ETCO2 and breath sounds checked- equal and bilateral Secured at: 21 cm Tube secured with: Tape Dental Injury: Teeth and Oropharynx as per pre-operative assessment

## 2014-01-25 NOTE — H&P (Signed)
[name] is an 43 y.o. male.   Chief Complaint: Nasal Obstruction HPI: Prog sx of nasal obstruction and AR  Past Medical History  Diagnosis Date  . History of cholecystectomy   . Nephrolithiasis   . Headache(784.0)     Migraine  . Allergy   . Chickenpox   . Pancreatitis   . Dyskinesia   . Ulnar neuropathy     mild symptoms   . 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 Anxiety   . Complication of anesthesia     hard to wake up-does not take much    Past Surgical History  Procedure Laterality Date  . Gallbladder surgery  2008  . Lithotripsy  2000  . Cholecystectomy  2009  . Appendectomy  2012    lap append  . Hernia repair  2011    repair from gallbladder surgery  . Hernia repair  0174    umbilical  . Wisdom tooth extraction    . Closed reduction clavicle fracture  1993    left    Family History  Problem Relation Age of Onset  . Cancer Mother     gyn cancer, pt didn't know source ovarian   Social History:  reports that he has never smoked. He has never used smokeless tobacco. He reports that he drinks alcohol. He reports that he does not use illicit drugs.  Allergies:  Allergies  Allergen Reactions  . Cephalexin     REACTION: rash  . Citalopram Hydrobromide     Sedation, lack of motivation  . Codeine     REACTION: nausea  . Cyclobenzaprine Hcl     REACTION: sedation  . Effexor [Venlafaxine Hydrochloride]     Nausea, intolerant  . Minocycline     hives  . Morphine     REACTION: vomiting  . Sulfonamide Derivatives     REACTION: rash  . Topiramate     REACTION: intolerant of dose of 75mg  or higher, tolerates 50mg  dose.    Medications Prior to Admission  Medication Sig Dispense Refill  . ALPRAZolam (XANAX) 0.5 MG tablet TAKE 1/4-1/2 TABLETS BY MOUTH AS NEEDED AS DIRECTED  30 tablet  0  . cetirizine (ZYRTEC) 10 MG tablet Take 10 mg by mouth daily.      . Echinacea 125 MG CAPS Take by mouth as needed.      . fluticasone (FLONASE)  50 MCG/ACT nasal spray Place 2 sprays into both nostrils daily.      Marland Kitchen ibuprofen (ADVIL,MOTRIN) 200 MG tablet Take 200 mg by mouth as needed.          Results for orders placed during the hospital encounter of 01/25/14 (from the past 48 hour(s))  POCT HEMOGLOBIN-HEMACUE     Status: None   Collection Time    01/25/14  7:51 AM      Result Value Ref Range   Hemoglobin 16.2  13.0 - 17.0 g/dL   No results found.  Review of Systems  Constitutional: Negative.   HENT: Negative.   Respiratory: Negative.   Cardiovascular: Negative.   Gastrointestinal: Negative.     Blood pressure 137/94, pulse 65, temperature 98 F (36.7 C), temperature source Oral, resp. rate 18, height 5\' 8"  (1.727 m), weight 88.905 kg (196 lb), SpO2 100.00%. Physical Exam  Constitutional: He is oriented to person, place, and time. He appears well-developed and well-nourished.  HENT:  Nose: Septal deviation present.  Neck: Normal range of motion. Neck supple.  Cardiovascular: Normal rate.   Respiratory: Effort normal.  Musculoskeletal: Normal range of motion.  Neurological: He is alert and oriented to person, place, and time.     Assessment/Plan Adm for OP septoplasty and IT Reduction  [name] 01/25/2014, 9:13 AM

## 2014-01-25 NOTE — Transfer of Care (Signed)
Immediate Anesthesia Transfer of Care Note  Patient: [name]  Procedure(s) Performed: Procedure(s): NASAL SEPTOPLASTY/BILATERAL INFERIOR TURBINATE REDUCTION (Bilateral)  Patient Location: PACU  Anesthesia Type:General  Level of Consciousness: sedated and patient cooperative  Airway & Oxygen Therapy: Patient Spontanous Breathing and aerosol face mask  Post-op Assessment: Report given to PACU RN and Post -op Vital signs reviewed and stable  Post vital signs: Reviewed and stable  Complications: No apparent anesthesia complications

## 2014-01-25 NOTE — Anesthesia Preprocedure Evaluation (Addendum)
Anesthesia Evaluation  Patient identified by MRN, date of birth, ID band Patient awake    Reviewed: Allergy & Precautions, H&P , NPO status , Patient's Chart, lab work & pertinent test results  Airway Mallampati: I TM Distance: >3 FB Neck ROM: Full    Dental  (+) Teeth Intact, Dental Advisory Given   Pulmonary  breath sounds clear to auscultation        Cardiovascular Rhythm:Regular Rate:Normal     Neuro/Psych    GI/Hepatic   Endo/Other    Renal/GU      Musculoskeletal   Abdominal   Peds  Hematology   Anesthesia Other Findings   Reproductive/Obstetrics                           Anesthesia Physical Anesthesia Plan  ASA: I  Anesthesia Plan: General   Post-op Pain Management:    Induction: Intravenous  Airway Management Planned: Oral ETT  Additional Equipment:   Intra-op Plan:   Post-operative Plan: Extubation in OR  Informed Consent: I have reviewed the patients History and Physical, chart, labs and discussed the procedure including the risks, benefits and alternatives for the proposed anesthesia with the patient or authorized representative who has indicated his/her understanding and acceptance.   Dental advisory given  Plan Discussed with: CRNA, Anesthesiologist and Surgeon  Anesthesia Plan Comments:         Anesthesia Quick Evaluation

## 2014-01-25 NOTE — Brief Op Note (Signed)
01/25/2014  10:18 AM  PATIENT:  Juluis Mire  43 y.o. male  PRE-OPERATIVE DIAGNOSIS:  DEVIATED SEPTUM  POST-OPERATIVE DIAGNOSIS:  DEVIATED SEPTUM  PROCEDURE:  Procedure(s): NASAL SEPTOPLASTY/BILATERAL INFERIOR TURBINATE REDUCTION (Bilateral)  SURGEON:  Surgeon(s) and Role:    * Jerrell Belfast, MD - Primary  PHYSICIAN ASSISTANT:   ASSISTANTS: none   ANESTHESIA:   general  EBL:  Total I/O In: 1000 [I.V.:1000] Out: - <50cc  BLOOD ADMINISTERED:none  DRAINS: none   LOCAL MEDICATIONS USED:  LIDOCAINE  and Amount: 7 ml  SPECIMEN:  No Specimen  DISPOSITION OF SPECIMEN:  N/A  COUNTS:  YES  TOURNIQUET:  * No tourniquets in log *  DICTATION: .Other Dictation: Dictation Number [id number]  PLAN OF CARE: Discharge to home after PACU  PATIENT DISPOSITION:  PACU - hemodynamically stable.   Delay start of Pharmacological VTE agent (>24hrs) due to surgical blood loss or risk of bleeding: not applicable

## 2014-01-25 NOTE — Discharge Instructions (Signed)

## 2014-01-26 ENCOUNTER — Encounter (HOSPITAL_BASED_OUTPATIENT_CLINIC_OR_DEPARTMENT_OTHER): Payer: Self-pay | Admitting: Otolaryngology

## 2014-01-26 NOTE — Op Note (Signed)
[name], [name] NO.:  [id number]  MEDICAL RECORD NO.:  [id number]  LOCATION:                                 FACILITY:  PHYSICIAN:  Early Chars. Wilburn Cornelia, M.D.DATE OF BIRTH:  [birth year]  DATE OF PROCEDURE:  01/25/2014 DATE OF DISCHARGE:  01/25/2014                              OPERATIVE REPORT   PREOPERATIVE DIAGNOSES: 1. Deviated nasal septum with airway obstruction. 2. Bilateral inferior turbinate hypertrophy.  POSTOPERATIVE DIAGNOSES: 1. Deviated nasal septum with airway obstruction. 2. Bilateral inferior turbinate hypertrophy.  INDICATION FOR SURGERY: 1. Deviated nasal septum with airway obstruction. 2. Bilateral inferior turbinate hypertrophy.  SURGICAL PROCEDURE: 1. Nasal septoplasty. 2. Bilateral inferior turbinate reduction.  ANESTHESIA:  General endotracheal.  COMPLICATIONS:  None.  ESTIMATED BLOOD LOSS:  Less than 50 mL.  The patient transferred from the operating room to the recovery room in stable condition.  BRIEF HISTORY:  The patient is a 43 year old male who was referred to our office with a history of progressive nasal airway obstruction.  He had a history of significant seasonal allergies and recurrent sinusitis and has noted increasing symptoms of nasal airway obstruction, nasal congestion, and frequent periorbital headaches.  The patient was treated with appropriate medical therapy including topical nasal steroids and saline spray.  He had limited improvement with this regimen and followup evaluation showed a severely deviated septum with turbinate hypertrophy. Given his history and failure to respond to appropriate medical therapy, I recommended the above surgical procedures.  The risks and benefits were discussed in detail.  The patient understood and concurred with our plan for surgery which is scheduled on elective basis at [place].  DESCRIPTION OF PROCEDURE:  The patient was brought to  the operating room on January 25, 2014, and placed in supine position on the operating table.  General endotracheal anesthesia was established without difficulty.  When the patient was adequately anesthetized, he was positioned on the operating table and prepped and draped in a sterile fashion.  His nose was then injected with a total of 7 mL of 1% lidocaine with 1:100,000 solution epinephrine injected in submucosal fashion on the nasal septum and inferior turbinates bilaterally.  His nose was then packed with Afrin soaked cottonoid pledgets and were left in place for approximately 10 minutes for vasoconstriction hemostasis.  When the patient prepared for surgery, left anterior hemitransfixion incision was created and mucoperichondrial flap was elevated from anterior to posterior on the left-hand side.  The cartilaginous septum was crossed in the midline and mucoperichondrial flap elevated on the right.  This exposed a significantly deviated nasal septal cartilage and bone which was carefully mobilized.  Anterior mid septal cartilage was removed.  This later morselized and returned to the mucoperichondrial pocket.  Dissection was then carried out from anterior to posterior removing deviated bone and cartilage.  There was a large inferior bony septal spur on the right which was mobilized preserving the overlying mucosa using a 4-mm osteotome and then resected.  With the septum brought to a good midline position, the morselized cartilage was returned to the mucoperichondrial pocket and the flaps were reapproximated with a 4-0 gut suture on a  Keith needle in a horizontal mattressing fashion.  At the conclusion of procedure, bilateral Doyle nasal septal splints were placed after the application of Bactroban ointment and sutured in position with a 3-0 Ethilon suture.  Inferior turbinate reduction was then performed with cautery set at 12 watts.  Two submucosal passes were made in each  inferior turbinate. When the turbinates were adequately cauterized, a small anterior incision was created bilaterally and overlying soft tissue elevated. Small amount of turbinate bone was resected.  The turbinates were then outfractured creating a more patent nasal cavity.  The patient's nasal cavity was irrigated and suctioned.  Orogastric tube was passed.  Stomach contents aspirated.  The patient was awakened from his anesthetic, and then extubated without difficulty.  He was transferred from the operating room to the recovery room in stable condition.          ______________________________ Early Chars Wilburn Cornelia, M.D.     DLS/MEDQ  D:  91/63/8466  T:  01/25/2014  Job:  [id number]

## 2014-02-08 ENCOUNTER — Other Ambulatory Visit: Payer: Self-pay | Admitting: Family Medicine

## 2014-02-08 NOTE — Telephone Encounter (Signed)
Last filled 09/12/13.

## 2014-02-08 NOTE — Telephone Encounter (Signed)
Phoned in to pharmacy. 

## 2014-02-08 NOTE — Telephone Encounter (Signed)
p checking on status of xanax refill; pt will ck with pharmacy; med called in 1:15. Pt appreciative and voiced understanding.

## 2014-02-08 NOTE — Telephone Encounter (Signed)
Please call in.  Thanks.   

## 2014-08-09 ENCOUNTER — Ambulatory Visit: Payer: 59 | Admitting: Family Medicine

## 2014-08-11 ENCOUNTER — Ambulatory Visit (INDEPENDENT_AMBULATORY_CARE_PROVIDER_SITE_OTHER): Payer: Managed Care, Other (non HMO) | Admitting: Family Medicine

## 2014-08-11 ENCOUNTER — Encounter: Payer: Self-pay | Admitting: Family Medicine

## 2014-08-11 VITALS — BP 110/70 | HR 78 | Temp 98.1°F | Wt 202.0 lb

## 2014-08-11 DIAGNOSIS — M2550 Pain in unspecified joint: Secondary | ICD-10-CM

## 2014-08-11 DIAGNOSIS — J0110 Acute frontal sinusitis, unspecified: Secondary | ICD-10-CM

## 2014-08-11 LAB — URIC ACID: Uric Acid, Serum: 7.6 mg/dL (ref 4.0–7.8)

## 2014-08-11 NOTE — Progress Notes (Signed)
Pre visit review using our clinic review tool, if applicable. No additional management support is needed unless otherwise documented below in the visit note.  He got a job at [employer] that is going well.    Seen for sinusitis at outside cliniuc.  Started on azithromycin.  Only had 2 doses so far.  Sick contacts at work.  Some R ear pressure.  No fevers.  No vomiting.  No cough.  Still with some facial pain B.   Started on BB for HA in meantime.    Joint aches.  R 5th PIP, L 3rd PIP, L 1st MTP.  No trauma.  Unclear trigger.  Never red, hot, swollen.  Can get tight and sore.  He has a tendency to grip hard when working.  Advil helps some.    Meds, vitals, and allergies reviewed.   ROS: See HPI.  Otherwise, noncontributory.  GEN: nad, alert and oriented HEENT: mucous membranes moist, tm w/o erythema, nasal exam w/o erythema, clear discharge noted,  OP with cobblestoning, frontal sinuses ttp B NECK: supple w/o LA CV: rrr.   PULM: ctab, no inc wob EXT: no edema SKIN: no acute rash Hands with normal inspection, R 5th PIP and L 3rd PIP not red/hot.  Normal ROM, no locking.  Able to make a composite fist, intrinsic muscles with normal testing.  Distally nv intact Feet with normal inspection.  Has heavy wear pattern on the shoes B at the 1st MTP areas

## 2014-08-11 NOTE — Patient Instructions (Signed)
Soft inserts in your shoes.  Go to the lab on the way out.  We'll contact you with your lab report. Finish the antibiotics.  Take care.

## 2014-08-13 DIAGNOSIS — M26629 Arthralgia of temporomandibular joint, unspecified side: Secondary | ICD-10-CM | POA: Insufficient documentation

## 2014-08-13 DIAGNOSIS — M2550 Pain in unspecified joint: Secondary | ICD-10-CM | POA: Insufficient documentation

## 2014-08-13 DIAGNOSIS — J019 Acute sinusitis, unspecified: Secondary | ICD-10-CM | POA: Insufficient documentation

## 2014-08-13 HISTORY — DX: Pain in unspecified joint: M25.50

## 2014-08-13 NOTE — Assessment & Plan Note (Signed)
Finish zmax and f/u prn.  Nontoxic.

## 2014-08-13 NOTE — Assessment & Plan Note (Signed)
This could be an overuse issue on the hands/PIPs.  The foot sx are likely incidental and can be treated with inserts for more cushion.  D/w pt.  Okay to check uric acid today.  See notes on labs.

## 2014-09-11 ENCOUNTER — Encounter: Payer: Self-pay | Admitting: Family Medicine

## 2014-09-11 ENCOUNTER — Ambulatory Visit (INDEPENDENT_AMBULATORY_CARE_PROVIDER_SITE_OTHER): Payer: Managed Care, Other (non HMO) | Admitting: Family Medicine

## 2014-09-11 VITALS — BP 102/70 | HR 72 | Temp 98.8°F | Wt 201.5 lb

## 2014-09-11 DIAGNOSIS — R059 Cough, unspecified: Secondary | ICD-10-CM

## 2014-09-11 DIAGNOSIS — R05 Cough: Secondary | ICD-10-CM | POA: Diagnosis not present

## 2014-09-11 DIAGNOSIS — K648 Other hemorrhoids: Secondary | ICD-10-CM

## 2014-09-11 LAB — POCT INFLUENZA A/B
INFLUENZA B, POC: NEGATIVE
Influenza A, POC: NEGATIVE

## 2014-09-11 MED ORDER — CLINDAMYCIN HCL 300 MG PO CAPS: 300.0000 mg | ORAL_CAPSULE | Freq: Three times a day (TID) | ORAL | Status: DC

## 2014-09-11 NOTE — Progress Notes (Signed)
Pre visit review using our clinic review tool, if applicable. No additional management support is needed unless otherwise documented below in the visit note.  Known sick contacts.  Sx started about 1 week ago.  Cough and burning in the chest.  Then a ST.  Aching, just not feeling well.  Used SABA in the meantime, with some relief of chest sx.  Taking OTC cold/allergy meds.  Had temps up to 101.2 over this past weekend.  Taking tylenol in the meantime for fever.  No fevers yesterday.  Still with cough/sputum- discolored.  Some scant wheeze noted by patient.    He likely developed a hemorrhoid from the coughing.  Noted some BRBPR recently.  No black stools.  Less bleeding in the meantime.  He can feel a rectal bulge on the R side.  D/w pt that the cough may have increase the chance of a hemorrhoid, he agrees.  He was coughing hard recently.     Meds, vitals, and allergies reviewed.   ROS: See HPI.  Otherwise, noncontributory.  GEN: nad, alert and oriented HEENT: mucous membranes moist, tm w/o erythema, nasal exam w/o erythema, clear discharge noted,  OP with cobblestoning, max sinus ttp B NECK: supple w/o LA CV: rrr.   PULM: no inc wob and no wheeze but B basilar exp rhonchi noted.  EXT: no edema SKIN: no acute rash R sided int hemorrhoid noted, no gross blood.

## 2014-09-11 NOTE — Patient Instructions (Signed)
Take clinda in the meantime.  Use the inhaler for the cough.  Rest and fluids.   Try to avoid straining, use a stool softener if needed.   Glad to see you.

## 2014-09-12 DIAGNOSIS — K648 Other hemorrhoids: Secondary | ICD-10-CM | POA: Insufficient documentation

## 2014-09-12 DIAGNOSIS — R059 Cough, unspecified: Secondary | ICD-10-CM | POA: Insufficient documentation

## 2014-09-12 DIAGNOSIS — R05 Cough: Secondary | ICD-10-CM | POA: Insufficient documentation

## 2014-09-12 NOTE — Assessment & Plan Note (Signed)
Likely bronchitis, given the max tenderness, would treat.  Out of work for now.  Start clinda with routine cautions.  D/w pt.  He agrees.  Okay for outpatient f/u. Supportive care o/w.  He agrees.

## 2014-09-12 NOTE — Assessment & Plan Note (Signed)
Likely aggravated by the recently coughing. D/w pt about symptomatic care.  F/u prn.

## 2014-12-18 ENCOUNTER — Telehealth: Payer: Self-pay | Admitting: Family Medicine

## 2014-12-18 MED ORDER — AZITHROMYCIN 250 MG PO TABS: ORAL_TABLET | ORAL | Status: DC

## 2014-12-18 NOTE — Telephone Encounter (Signed)
Patient advised.

## 2014-12-18 NOTE — Telephone Encounter (Signed)
Patient Name: [name] DOB: [birth year] Initial Comment caller states he has a recurrent sinus infection - is having congestion and pressure - behind eyes and has gurgling and pressure in ear - would like an rx called in Nurse Assessment Nurse: Ronnald Ramp, RN, Miranda Date/Time ([place] Time): 12/18/2014 12:21:51 PM Confirm and document reason for call. If symptomatic, describe symptoms. ---Caller states has been having sinus pressure and congestion for the last week. Also with congestion and pressure in his ears. Denies fever. Has the patient traveled out of the country within the last 30 days? ---No Does the patient require triage? ---Yes Related visit to physician within the last 2 weeks? ---No Does the PT have any chronic conditions? (i.e. diabetes, asthma, etc.) ---Yes List chronic conditions. ---Allergies Guidelines Guideline Title Affirmed Question Affirmed Notes Sinus Pain or Congestion Earache Final Disposition User See Physician within 24 Hours Jones, RN, Jeannetta Nap Comments Pt states unable to schedule an appt at this time due to work schedule. He would like to see if the MD will call something in. Told caller the doctors normally do not call in meds without a pt being seen but I would forward the note to the doctor. Also suggested he contact his ENT. Referrals REFERRED TO PCP OFFICE GO TO FACILITY REFUSED Disagree/Comply: Comply

## 2014-12-18 NOTE — Telephone Encounter (Signed)
I would start zithromax and f/u if not better.  rx sent.  Thanks.  If ENT has acted on this in the meantime, then I'll defer to them.

## 2015-02-06 ENCOUNTER — Encounter: Payer: Self-pay | Admitting: Family Medicine

## 2015-02-06 ENCOUNTER — Ambulatory Visit (INDEPENDENT_AMBULATORY_CARE_PROVIDER_SITE_OTHER)
Admission: RE | Admit: 2015-02-06 | Discharge: 2015-02-06 | Disposition: A | Discharge status: Home / Self Care | Payer: Managed Care, Other (non HMO) | Source: Ambulatory Visit | Attending: Family Medicine | Admitting: Family Medicine

## 2015-02-06 ENCOUNTER — Ambulatory Visit (INDEPENDENT_AMBULATORY_CARE_PROVIDER_SITE_OTHER): Payer: Managed Care, Other (non HMO) | Admitting: Family Medicine

## 2015-02-06 VITALS — BP 118/74 | HR 68 | Temp 98.7°F | Wt 197.8 lb

## 2015-02-06 DIAGNOSIS — N200 Calculus of kidney: Secondary | ICD-10-CM

## 2015-02-06 DIAGNOSIS — R300 Dysuria: Secondary | ICD-10-CM | POA: Diagnosis not present

## 2015-02-06 LAB — POCT URINALYSIS DIPSTICK
Bilirubin, UA: NEGATIVE
Glucose, UA: NEGATIVE
Ketones, UA: NEGATIVE
Leukocytes, UA: NEGATIVE
Nitrite, UA: NEGATIVE
PH UA: 6
PROTEIN UA: NEGATIVE
Spec Grav, UA: >=1.03
UROBILINOGEN UA: 0.2

## 2015-02-06 MED ORDER — ALPRAZOLAM 0.5 MG PO TABS: ORAL_TABLET | ORAL | Status: DC

## 2015-02-06 MED ORDER — TAMSULOSIN HCL 0.4 MG PO CAPS: 0.4000 mg | ORAL_CAPSULE | Freq: Every day | ORAL | Status: DC

## 2015-02-06 MED ORDER — HYDROCODONE-ACETAMINOPHEN 5-325 MG PO TABS: 1.0000 | ORAL_TABLET | Freq: Four times a day (QID) | ORAL | Status: DC | PRN

## 2015-02-06 NOTE — Patient Instructions (Addendum)
Go to the lab on the way out.  We'll contact you with your xray and lab report. Use vicodin if needed for pain.  Sedation caution.  Start flomax.

## 2015-02-06 NOTE — Progress Notes (Signed)
Pre visit review using our clinic review tool, if applicable. No additional management support is needed unless otherwise documented below in the visit note.  R flank pain, episodic, no L sided sx.  Dysuria.  Known prev R sided stone on CT done 01/2015- prev CT and KUB reviewed and d/w pt.  Blood in urine, d/w pt.  No fevers.  No vomiting.    Meds, vitals, and allergies reviewed.   ROS: See HPI.  Otherwise, noncontributory.  GEN: nad, alert and oriented HEENT: mucous membranes moist NECK: supple w/o LA CV: rrr.  PULM: ctab, no inc wob ABD: soft, +bs, not ttp at the time of exam.  EXT: no edema  KUB reviewed independently and I agree with likely R sided stone, along with known L sided renal stone.

## 2015-02-07 NOTE — Assessment & Plan Note (Signed)
See above re: KUB.  Okay for outpatient f/u.  Ucx.  vicodin and flomax.  Refer to uro.  Fluids PO.  F/u prn  He agrees.

## 2015-02-08 LAB — URINE CULTURE
Colony Count: NO GROWTH
ORGANISM ID, BACTERIA: NO GROWTH

## 2015-10-03 ENCOUNTER — Ambulatory Visit: Payer: Self-pay | Admitting: Internal Medicine

## 2016-04-09 DIAGNOSIS — J301 Allergic rhinitis due to pollen: Secondary | ICD-10-CM | POA: Diagnosis not present

## 2016-04-09 DIAGNOSIS — J3081 Allergic rhinitis due to animal (cat) (dog) hair and dander: Secondary | ICD-10-CM | POA: Diagnosis not present

## 2016-04-15 DIAGNOSIS — J301 Allergic rhinitis due to pollen: Secondary | ICD-10-CM | POA: Diagnosis not present

## 2016-04-15 DIAGNOSIS — J3081 Allergic rhinitis due to animal (cat) (dog) hair and dander: Secondary | ICD-10-CM | POA: Diagnosis not present

## 2016-04-17 DIAGNOSIS — J301 Allergic rhinitis due to pollen: Secondary | ICD-10-CM | POA: Diagnosis not present

## 2016-04-17 DIAGNOSIS — J3081 Allergic rhinitis due to animal (cat) (dog) hair and dander: Secondary | ICD-10-CM | POA: Diagnosis not present

## 2016-04-22 DIAGNOSIS — J301 Allergic rhinitis due to pollen: Secondary | ICD-10-CM | POA: Diagnosis not present

## 2016-04-22 DIAGNOSIS — J3081 Allergic rhinitis due to animal (cat) (dog) hair and dander: Secondary | ICD-10-CM | POA: Diagnosis not present

## 2016-04-28 DIAGNOSIS — J301 Allergic rhinitis due to pollen: Secondary | ICD-10-CM | POA: Diagnosis not present

## 2016-04-28 DIAGNOSIS — J3081 Allergic rhinitis due to animal (cat) (dog) hair and dander: Secondary | ICD-10-CM | POA: Diagnosis not present

## 2016-05-08 DIAGNOSIS — J3081 Allergic rhinitis due to animal (cat) (dog) hair and dander: Secondary | ICD-10-CM | POA: Diagnosis not present

## 2016-05-08 DIAGNOSIS — J301 Allergic rhinitis due to pollen: Secondary | ICD-10-CM | POA: Diagnosis not present

## 2016-05-12 DIAGNOSIS — J3081 Allergic rhinitis due to animal (cat) (dog) hair and dander: Secondary | ICD-10-CM | POA: Diagnosis not present

## 2016-05-12 DIAGNOSIS — J301 Allergic rhinitis due to pollen: Secondary | ICD-10-CM | POA: Diagnosis not present

## 2016-05-21 DIAGNOSIS — J3081 Allergic rhinitis due to animal (cat) (dog) hair and dander: Secondary | ICD-10-CM | POA: Diagnosis not present

## 2016-05-21 DIAGNOSIS — J301 Allergic rhinitis due to pollen: Secondary | ICD-10-CM | POA: Diagnosis not present

## 2016-05-28 DIAGNOSIS — J3081 Allergic rhinitis due to animal (cat) (dog) hair and dander: Secondary | ICD-10-CM | POA: Diagnosis not present

## 2016-05-28 DIAGNOSIS — J301 Allergic rhinitis due to pollen: Secondary | ICD-10-CM | POA: Diagnosis not present

## 2016-06-03 DIAGNOSIS — J301 Allergic rhinitis due to pollen: Secondary | ICD-10-CM | POA: Diagnosis not present

## 2016-06-03 DIAGNOSIS — J3081 Allergic rhinitis due to animal (cat) (dog) hair and dander: Secondary | ICD-10-CM | POA: Diagnosis not present

## 2016-06-05 ENCOUNTER — Encounter: Payer: Self-pay | Admitting: Family Medicine

## 2016-06-05 ENCOUNTER — Ambulatory Visit (INDEPENDENT_AMBULATORY_CARE_PROVIDER_SITE_OTHER): Payer: BLUE CROSS/BLUE SHIELD | Admitting: Family Medicine

## 2016-06-05 DIAGNOSIS — G43909 Migraine, unspecified, not intractable, without status migrainosus: Secondary | ICD-10-CM

## 2016-06-05 DIAGNOSIS — J0100 Acute maxillary sinusitis, unspecified: Secondary | ICD-10-CM

## 2016-06-05 MED ORDER — AZITHROMYCIN 250 MG PO TABS: ORAL_TABLET | ORAL | 0 refills | Status: DC

## 2016-06-05 MED ORDER — ALPRAZOLAM 0.5 MG PO TABS: ORAL_TABLET | ORAL | 1 refills | Status: DC

## 2016-06-05 NOTE — Patient Instructions (Addendum)
Start zithromax in the meantime and update me as needed.  Rest and fluids in the meantime.   Physical in the summer.

## 2016-06-05 NOTE — Progress Notes (Signed)
duration of symptoms: 3 weeks.   rhinorrhea:no congestion:yes ear pain: yes sore throat: yes cough: minimal  Myalgias: at baseline from work around the house He had urinary stream slowing with discomfort after taking pseudoephedrine.   Mult sick exposures at work.  Prev was on doxy but then got sicker again.     Still taking prn xanax for anxiety and headaches.  No ADE on med.    Per HPI unless specifically indicated in ROS section   Meds, vitals, and allergies reviewed.   GEN: nad, alert and oriented HEENT: mucous membranes moist, TM w/o erythema, nasal epithelium injected, OP with cobblestoning, L max sinus ttp NECK: supple w/o LA CV: rrr. PULM: ctab, no inc wob ABD: soft, +bs EXT: no edema

## 2016-06-05 NOTE — Progress Notes (Signed)
Pre visit review using our clinic review tool, if applicable. No additional management support is needed unless otherwise documented below in the visit note. 

## 2016-06-06 NOTE — Assessment & Plan Note (Signed)
Nontoxic.  Start zithromax in the meantime and update me as needed.  Rest and fluids in the meantime.   He agrees.

## 2016-06-06 NOTE — Assessment & Plan Note (Signed)
Likely anxiety --> exacerbation of headaches.  Uses very low (!) dose of xanax.  This helped more than anything else.  Continue as is.  No ADE on med.  He agrees.

## 2016-06-11 DIAGNOSIS — J3081 Allergic rhinitis due to animal (cat) (dog) hair and dander: Secondary | ICD-10-CM | POA: Diagnosis not present

## 2016-06-11 DIAGNOSIS — J301 Allergic rhinitis due to pollen: Secondary | ICD-10-CM | POA: Diagnosis not present

## 2016-06-16 ENCOUNTER — Other Ambulatory Visit: Payer: Self-pay | Admitting: Family Medicine

## 2016-06-16 NOTE — Telephone Encounter (Signed)
Please get update on patient.  Let me know what his status is.  Thanks.

## 2016-06-16 NOTE — Telephone Encounter (Signed)
Patient sent MyChart request for refill on Azithromycin.  Last office visit 06/05/2016.  Last filled 06/05/2016 for #6 with no refills.  Refill?

## 2016-06-17 ENCOUNTER — Other Ambulatory Visit: Payer: Self-pay | Admitting: Family Medicine

## 2016-06-17 ENCOUNTER — Encounter: Payer: Self-pay | Admitting: Family Medicine

## 2016-06-17 MED ORDER — AZITHROMYCIN 250 MG PO TABS: ORAL_TABLET | ORAL | 0 refills | Status: DC

## 2016-06-17 NOTE — Telephone Encounter (Signed)
Patient sent another MyChart message, forwarded to you.

## 2016-06-23 DIAGNOSIS — J3081 Allergic rhinitis due to animal (cat) (dog) hair and dander: Secondary | ICD-10-CM | POA: Diagnosis not present

## 2016-06-23 DIAGNOSIS — Z91018 Allergy to other foods: Secondary | ICD-10-CM | POA: Diagnosis not present

## 2016-06-23 DIAGNOSIS — J329 Chronic sinusitis, unspecified: Secondary | ICD-10-CM | POA: Diagnosis not present

## 2016-06-23 DIAGNOSIS — J301 Allergic rhinitis due to pollen: Secondary | ICD-10-CM | POA: Diagnosis not present

## 2016-06-23 DIAGNOSIS — J45990 Exercise induced bronchospasm: Secondary | ICD-10-CM | POA: Diagnosis not present

## 2016-07-17 DIAGNOSIS — J3081 Allergic rhinitis due to animal (cat) (dog) hair and dander: Secondary | ICD-10-CM | POA: Diagnosis not present

## 2016-07-17 DIAGNOSIS — J301 Allergic rhinitis due to pollen: Secondary | ICD-10-CM | POA: Diagnosis not present

## 2016-07-24 DIAGNOSIS — J3081 Allergic rhinitis due to animal (cat) (dog) hair and dander: Secondary | ICD-10-CM | POA: Diagnosis not present

## 2016-07-24 DIAGNOSIS — J301 Allergic rhinitis due to pollen: Secondary | ICD-10-CM | POA: Diagnosis not present

## 2016-07-28 DIAGNOSIS — J301 Allergic rhinitis due to pollen: Secondary | ICD-10-CM | POA: Diagnosis not present

## 2016-07-28 DIAGNOSIS — J3081 Allergic rhinitis due to animal (cat) (dog) hair and dander: Secondary | ICD-10-CM | POA: Diagnosis not present

## 2016-08-04 DIAGNOSIS — J3081 Allergic rhinitis due to animal (cat) (dog) hair and dander: Secondary | ICD-10-CM | POA: Diagnosis not present

## 2016-08-04 DIAGNOSIS — J301 Allergic rhinitis due to pollen: Secondary | ICD-10-CM | POA: Diagnosis not present

## 2016-08-14 DIAGNOSIS — J3081 Allergic rhinitis due to animal (cat) (dog) hair and dander: Secondary | ICD-10-CM | POA: Diagnosis not present

## 2016-08-14 DIAGNOSIS — J301 Allergic rhinitis due to pollen: Secondary | ICD-10-CM | POA: Diagnosis not present

## 2016-08-19 DIAGNOSIS — J301 Allergic rhinitis due to pollen: Secondary | ICD-10-CM | POA: Diagnosis not present

## 2016-08-19 DIAGNOSIS — J3081 Allergic rhinitis due to animal (cat) (dog) hair and dander: Secondary | ICD-10-CM | POA: Diagnosis not present

## 2016-08-26 DIAGNOSIS — J301 Allergic rhinitis due to pollen: Secondary | ICD-10-CM | POA: Diagnosis not present

## 2016-08-26 DIAGNOSIS — J3081 Allergic rhinitis due to animal (cat) (dog) hair and dander: Secondary | ICD-10-CM | POA: Diagnosis not present

## 2016-09-04 DIAGNOSIS — J301 Allergic rhinitis due to pollen: Secondary | ICD-10-CM | POA: Diagnosis not present

## 2016-09-04 DIAGNOSIS — J3081 Allergic rhinitis due to animal (cat) (dog) hair and dander: Secondary | ICD-10-CM | POA: Diagnosis not present

## 2016-09-11 DIAGNOSIS — J3081 Allergic rhinitis due to animal (cat) (dog) hair and dander: Secondary | ICD-10-CM | POA: Diagnosis not present

## 2016-09-11 DIAGNOSIS — J301 Allergic rhinitis due to pollen: Secondary | ICD-10-CM | POA: Diagnosis not present

## 2016-09-16 DIAGNOSIS — J3081 Allergic rhinitis due to animal (cat) (dog) hair and dander: Secondary | ICD-10-CM | POA: Diagnosis not present

## 2016-09-16 DIAGNOSIS — J301 Allergic rhinitis due to pollen: Secondary | ICD-10-CM | POA: Diagnosis not present

## 2016-09-22 DIAGNOSIS — J301 Allergic rhinitis due to pollen: Secondary | ICD-10-CM | POA: Diagnosis not present

## 2016-09-22 DIAGNOSIS — J3081 Allergic rhinitis due to animal (cat) (dog) hair and dander: Secondary | ICD-10-CM | POA: Diagnosis not present

## 2016-09-25 DIAGNOSIS — J3081 Allergic rhinitis due to animal (cat) (dog) hair and dander: Secondary | ICD-10-CM | POA: Diagnosis not present

## 2016-09-25 DIAGNOSIS — J301 Allergic rhinitis due to pollen: Secondary | ICD-10-CM | POA: Diagnosis not present

## 2016-10-01 DIAGNOSIS — J301 Allergic rhinitis due to pollen: Secondary | ICD-10-CM | POA: Diagnosis not present

## 2016-10-01 DIAGNOSIS — J3081 Allergic rhinitis due to animal (cat) (dog) hair and dander: Secondary | ICD-10-CM | POA: Diagnosis not present

## 2016-10-08 DIAGNOSIS — J3081 Allergic rhinitis due to animal (cat) (dog) hair and dander: Secondary | ICD-10-CM | POA: Diagnosis not present

## 2016-10-08 DIAGNOSIS — J301 Allergic rhinitis due to pollen: Secondary | ICD-10-CM | POA: Diagnosis not present

## 2016-10-13 DIAGNOSIS — J301 Allergic rhinitis due to pollen: Secondary | ICD-10-CM | POA: Diagnosis not present

## 2016-10-13 DIAGNOSIS — J3081 Allergic rhinitis due to animal (cat) (dog) hair and dander: Secondary | ICD-10-CM | POA: Diagnosis not present

## 2016-10-16 ENCOUNTER — Other Ambulatory Visit: Payer: Self-pay | Admitting: Family Medicine

## 2016-10-16 DIAGNOSIS — Z6831 Body mass index (BMI) 31.0-31.9, adult: Secondary | ICD-10-CM | POA: Diagnosis not present

## 2016-10-16 DIAGNOSIS — J0101 Acute recurrent maxillary sinusitis: Secondary | ICD-10-CM | POA: Diagnosis not present

## 2016-10-17 NOTE — Telephone Encounter (Signed)
Get update from patient. What is the current situation?

## 2016-10-17 NOTE — Telephone Encounter (Signed)
Left detailed message on voicemail to return call with answer.

## 2016-10-20 NOTE — Telephone Encounter (Addendum)
Left detailed message on voicemail to return call.  No response after 2 attempts.  Mike Craze, CMA

## 2016-10-21 DIAGNOSIS — J301 Allergic rhinitis due to pollen: Secondary | ICD-10-CM | POA: Diagnosis not present

## 2016-10-21 DIAGNOSIS — J3081 Allergic rhinitis due to animal (cat) (dog) hair and dander: Secondary | ICD-10-CM | POA: Diagnosis not present

## 2016-10-28 DIAGNOSIS — J301 Allergic rhinitis due to pollen: Secondary | ICD-10-CM | POA: Diagnosis not present

## 2016-10-28 DIAGNOSIS — J3081 Allergic rhinitis due to animal (cat) (dog) hair and dander: Secondary | ICD-10-CM | POA: Diagnosis not present

## 2016-11-04 DIAGNOSIS — Z91018 Allergy to other foods: Secondary | ICD-10-CM | POA: Diagnosis not present

## 2016-11-04 DIAGNOSIS — J301 Allergic rhinitis due to pollen: Secondary | ICD-10-CM | POA: Diagnosis not present

## 2016-11-04 DIAGNOSIS — J329 Chronic sinusitis, unspecified: Secondary | ICD-10-CM | POA: Diagnosis not present

## 2016-11-04 DIAGNOSIS — J45990 Exercise induced bronchospasm: Secondary | ICD-10-CM | POA: Diagnosis not present

## 2016-11-06 DIAGNOSIS — J3081 Allergic rhinitis due to animal (cat) (dog) hair and dander: Secondary | ICD-10-CM | POA: Diagnosis not present

## 2016-11-06 DIAGNOSIS — J301 Allergic rhinitis due to pollen: Secondary | ICD-10-CM | POA: Diagnosis not present

## 2016-11-12 DIAGNOSIS — J301 Allergic rhinitis due to pollen: Secondary | ICD-10-CM | POA: Diagnosis not present

## 2016-11-12 DIAGNOSIS — J3081 Allergic rhinitis due to animal (cat) (dog) hair and dander: Secondary | ICD-10-CM | POA: Diagnosis not present

## 2016-11-25 DIAGNOSIS — J3081 Allergic rhinitis due to animal (cat) (dog) hair and dander: Secondary | ICD-10-CM | POA: Diagnosis not present

## 2016-11-25 DIAGNOSIS — J301 Allergic rhinitis due to pollen: Secondary | ICD-10-CM | POA: Diagnosis not present

## 2016-12-01 DIAGNOSIS — J310 Chronic rhinitis: Secondary | ICD-10-CM | POA: Diagnosis not present

## 2016-12-01 DIAGNOSIS — J0141 Acute recurrent pansinusitis: Secondary | ICD-10-CM | POA: Diagnosis not present

## 2016-12-08 ENCOUNTER — Telehealth: Payer: Self-pay | Admitting: Family Medicine

## 2016-12-08 NOTE — Telephone Encounter (Signed)
PLEASE NOTE: All timestamps contained within this report are represented as [place] Standard Time. CONFIDENTIALTY NOTICE: This fax transmission is intended only for the addressee. It contains information that is legally privileged, confidential or otherwise protected from use or disclosure. If you are not the intended recipient, you are strictly prohibited from reviewing, disclosing, copying using or disseminating any of this information or taking any action in reliance on or regarding this information. If you have received this fax in error, please notify us immediately by telephone so that we can arrange for its return to [place]. Phone: [phone], Toll-Free: [phone], Fax: [phone] Page: 1 of 1 Call Id: [id number] [place] Patient Name: [name] Gender: Male DOB: [birth year] Age: 46 Y 10 M 20 D Return Phone Number: [phone] (Primary) City/State/Zip:  Client [place] [facility] - Client Client Site [facility] [place] Physician Renford Dills - MD Who Is Calling Patient / Member / Family / Caregiver Call Type Triage / Clinical Relationship To Patient Self Return Phone Number [phone] (Primary) Chief Complaint Tick Bite Reason for Call Symptomatic / Request for [place] states that he removed a tick from his left leg yesterday Appointment Disposition EMR Appointment Not Necessary Info pasted into Epic Yes Nurse Assessment Nurse: Ronnald Ramp, RN, Miranda Date/Time (Eastern Time): 12/08/2016 9:22:10 AM Confirm and document reason for call. If symptomatic, describe symptoms. ---Caller states he removed a tick from his left leg yesterday. He is not having any symptoms. Does the PT have any chronic conditions? (i.e. diabetes, asthma, etc.) ---Yes List chronic conditions. ---Allergies Guidelines Guideline Title  Affirmed Question Tick Bite Tick bite with no complications Disp. Time Eilene Ghazi Time) Disposition Final User 12/08/2016 9:26:37 AM Home Care Yes Ronnald Ramp, RN, [place] Advice Given Per Guideline HOME CARE: You should be able to treat this at home. REASSURANCE: Most tick bites are harmless and can be treated at home. The spread of disease by ticks is rare. ANTIBIOTIC OINTMENT: Wash the wound and your hands with soap and water after removal to prevent catching any tick disease. Apply antibiotic ointment (OTC) to the bite once. EXPECTED COURSE: Tick bites normally don't itch or hurt. That's why they often go unnoticed. CALL BACK IF: * Fever or rash occur in the next 2 weeks * Bite begins to look infected * You become worse. CARE ADVICE given per Tick Bites (Adult) guideline.

## 2016-12-08 NOTE — Telephone Encounter (Signed)
Patient Name: [name] DOB: Mar 30, 1971 Initial Comment Caller states that he removed a tick from his left leg yesterday Nurse Assessment Nurse: Ronnald Ramp, RN, Miranda Date/Time (Eastern Time): 12/08/2016 9:22:10 AM Confirm and document reason for call. If symptomatic, describe symptoms. ---Caller states he removed a tick from his left leg yesterday. He is not having any symptoms. Does the patient have any new or worsening symptoms? ---Yes Will a triage be completed? ---Yes Related visit to physician within the last 2 weeks? ---No Does the PT have any chronic conditions? (i.e. diabetes, asthma, etc.) ---Yes List chronic conditions. ---Allergies Is this a behavioral health or substance abuse call? ---No Guidelines Guideline Title Affirmed Question Affirmed Notes Tick Bite Tick bite with no complications Final Disposition [place], RN, Miranda Disagree/Comply: Comply

## 2016-12-09 DIAGNOSIS — J3081 Allergic rhinitis due to animal (cat) (dog) hair and dander: Secondary | ICD-10-CM | POA: Diagnosis not present

## 2016-12-09 DIAGNOSIS — J301 Allergic rhinitis due to pollen: Secondary | ICD-10-CM | POA: Diagnosis not present

## 2016-12-23 DIAGNOSIS — J3081 Allergic rhinitis due to animal (cat) (dog) hair and dander: Secondary | ICD-10-CM | POA: Diagnosis not present

## 2016-12-23 DIAGNOSIS — Z91018 Allergy to other foods: Secondary | ICD-10-CM | POA: Diagnosis not present

## 2016-12-23 DIAGNOSIS — J329 Chronic sinusitis, unspecified: Secondary | ICD-10-CM | POA: Diagnosis not present

## 2016-12-23 DIAGNOSIS — J301 Allergic rhinitis due to pollen: Secondary | ICD-10-CM | POA: Diagnosis not present

## 2016-12-23 DIAGNOSIS — J45990 Exercise induced bronchospasm: Secondary | ICD-10-CM | POA: Diagnosis not present

## 2017-01-06 DIAGNOSIS — J3081 Allergic rhinitis due to animal (cat) (dog) hair and dander: Secondary | ICD-10-CM | POA: Diagnosis not present

## 2017-01-06 DIAGNOSIS — J301 Allergic rhinitis due to pollen: Secondary | ICD-10-CM | POA: Diagnosis not present

## 2017-01-20 DIAGNOSIS — J301 Allergic rhinitis due to pollen: Secondary | ICD-10-CM | POA: Diagnosis not present

## 2017-01-20 DIAGNOSIS — J3081 Allergic rhinitis due to animal (cat) (dog) hair and dander: Secondary | ICD-10-CM | POA: Diagnosis not present

## 2017-02-03 DIAGNOSIS — J3081 Allergic rhinitis due to animal (cat) (dog) hair and dander: Secondary | ICD-10-CM | POA: Diagnosis not present

## 2017-02-03 DIAGNOSIS — J301 Allergic rhinitis due to pollen: Secondary | ICD-10-CM | POA: Diagnosis not present

## 2017-02-06 ENCOUNTER — Encounter: Payer: Self-pay | Admitting: Family Medicine

## 2017-02-06 ENCOUNTER — Ambulatory Visit (INDEPENDENT_AMBULATORY_CARE_PROVIDER_SITE_OTHER): Payer: BLUE CROSS/BLUE SHIELD | Admitting: Family Medicine

## 2017-02-06 VITALS — BP 142/78 | HR 65 | Temp 98.9°F | Wt 198.0 lb

## 2017-02-06 DIAGNOSIS — R21 Rash and other nonspecific skin eruption: Secondary | ICD-10-CM | POA: Diagnosis not present

## 2017-02-06 DIAGNOSIS — Z23 Encounter for immunization: Secondary | ICD-10-CM | POA: Diagnosis not present

## 2017-02-06 DIAGNOSIS — F411 Generalized anxiety disorder: Secondary | ICD-10-CM | POA: Diagnosis not present

## 2017-02-06 MED ORDER — CLOTRIMAZOLE-BETAMETHASONE 1-0.05 % EX CREA: 1.0000 "application " | TOPICAL_CREAM | Freq: Two times a day (BID) | CUTANEOUS | 0 refills | Status: DC

## 2017-02-06 MED ORDER — ALPRAZOLAM 0.5 MG PO TABS: ORAL_TABLET | ORAL | 0 refills | Status: DC

## 2017-02-06 NOTE — Progress Notes (Signed)
He never had to get refill on the xanax rx prev, and it has run out of date in the meantime.  rx done at [facility].  He has episodic anxiety and that seems to trigger his migraines; with relief with xanax for anxiety.    Rash.  His air conditioner broke down and it has been hot at home.  He sweats profusely.  Had been using TAC cream and that helps.   In the meantime he had a rash along his waistline and on the thighs anteriorly and posteriorly, bilaterally.  He has been getting allergy shots through the allergy clinic.    Meds, vitals, and allergies reviewed.   ROS: Per HPI unless specifically indicated in ROS section   GEN: nad, alert and oriented EXT: no edema  SKIN: Blanching irregular reddish rash on thighs and waistline.  No ulceration.

## 2017-02-06 NOTE — Patient Instructions (Signed)
Change over to lotrisone.  See if that helps.  Take care.  Glad to see you.  Update me as needed.

## 2017-02-07 DIAGNOSIS — L989 Disorder of the skin and subcutaneous tissue, unspecified: Secondary | ICD-10-CM | POA: Insufficient documentation

## 2017-02-07 DIAGNOSIS — R21 Rash and other nonspecific skin eruption: Secondary | ICD-10-CM | POA: Insufficient documentation

## 2017-02-07 HISTORY — DX: Disorder of the skin and subcutaneous tissue, unspecified: L98.9

## 2017-02-07 NOTE — Assessment & Plan Note (Signed)
Likely related to heat/sweat/irritation with a fungal component. Change to Lotrisone. Update me as needed. He is working to get his air conditioner fixed.

## 2017-02-07 NOTE — Assessment & Plan Note (Signed)
Continue as needed use of low-dose benzodiazepine. Refill done at office visit.

## 2017-02-17 DIAGNOSIS — J301 Allergic rhinitis due to pollen: Secondary | ICD-10-CM | POA: Diagnosis not present

## 2017-02-17 DIAGNOSIS — J3081 Allergic rhinitis due to animal (cat) (dog) hair and dander: Secondary | ICD-10-CM | POA: Diagnosis not present

## 2017-02-22 DIAGNOSIS — J3081 Allergic rhinitis due to animal (cat) (dog) hair and dander: Secondary | ICD-10-CM | POA: Diagnosis not present

## 2017-02-22 DIAGNOSIS — J301 Allergic rhinitis due to pollen: Secondary | ICD-10-CM | POA: Diagnosis not present

## 2017-03-03 DIAGNOSIS — J301 Allergic rhinitis due to pollen: Secondary | ICD-10-CM | POA: Diagnosis not present

## 2017-03-03 DIAGNOSIS — J3081 Allergic rhinitis due to animal (cat) (dog) hair and dander: Secondary | ICD-10-CM | POA: Diagnosis not present

## 2017-03-16 DIAGNOSIS — J3081 Allergic rhinitis due to animal (cat) (dog) hair and dander: Secondary | ICD-10-CM | POA: Diagnosis not present

## 2017-03-16 DIAGNOSIS — J301 Allergic rhinitis due to pollen: Secondary | ICD-10-CM | POA: Diagnosis not present

## 2017-03-25 DIAGNOSIS — J3081 Allergic rhinitis due to animal (cat) (dog) hair and dander: Secondary | ICD-10-CM | POA: Diagnosis not present

## 2017-03-25 DIAGNOSIS — J301 Allergic rhinitis due to pollen: Secondary | ICD-10-CM | POA: Diagnosis not present

## 2017-04-01 DIAGNOSIS — J301 Allergic rhinitis due to pollen: Secondary | ICD-10-CM | POA: Diagnosis not present

## 2017-04-01 DIAGNOSIS — J3081 Allergic rhinitis due to animal (cat) (dog) hair and dander: Secondary | ICD-10-CM | POA: Diagnosis not present

## 2017-04-14 DIAGNOSIS — J301 Allergic rhinitis due to pollen: Secondary | ICD-10-CM | POA: Diagnosis not present

## 2017-04-14 DIAGNOSIS — J3081 Allergic rhinitis due to animal (cat) (dog) hair and dander: Secondary | ICD-10-CM | POA: Diagnosis not present

## 2017-04-15 ENCOUNTER — Ambulatory Visit: Payer: BLUE CROSS/BLUE SHIELD | Admitting: Internal Medicine

## 2017-04-15 ENCOUNTER — Telehealth: Payer: Self-pay | Admitting: Family Medicine

## 2017-04-15 ENCOUNTER — Encounter: Payer: Self-pay | Admitting: Internal Medicine

## 2017-04-15 VITALS — BP 132/80 | HR 75 | Temp 98.1°F | Resp 14 | Ht 67.0 in | Wt 201.2 lb

## 2017-04-15 DIAGNOSIS — J019 Acute sinusitis, unspecified: Secondary | ICD-10-CM | POA: Diagnosis not present

## 2017-04-15 MED ORDER — DOXYCYCLINE HYCLATE 100 MG PO TABS: 100.0000 mg | ORAL_TABLET | Freq: Two times a day (BID) | ORAL | 0 refills | Status: DC

## 2017-04-15 NOTE — Telephone Encounter (Signed)
I spoke with pt and he scheduled appt to see Dr Larose Kells at [facility] 04/15/17 at 11 AM.

## 2017-04-15 NOTE — Progress Notes (Signed)
Subjective:    Patient ID: [name], male    DOB: 04/25/1971, 46 y.o.   MRN: [id number]  DOS:  04/15/2017 Type of visit - description : acute Interval history: The patient has allergies that are usually well controlled but a  month ago developed a URI;  since then is having persistent bilateral maxillary sinus congestion, frontal  headaches most afternoons ; HA last several hours, decreased with Advil and antihistaminics. Wonders if doxycycline could be prescribed for presumed sinusitis   Review of Systems  Denies fever chills + Postnasal dripping. Mild cough  with occasional chest congestion. Mild nasal discharge  Past Medical History:  Diagnosis Date  . Allergy   . Anxiety   . Chickenpox   . Complication of anesthesia    hard to wake up-does not take much  . Dyskinesia   . Headache(784.0)    Migraine  . History of cholecystectomy   . 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 Nephrolithiasis   . Pancreatitis   . Ulnar neuropathy    mild symptoms     Past Surgical History:  Procedure Laterality Date  . APPENDECTOMY  2012   lap append  . CHOLECYSTECTOMY  2009  . CLOSED REDUCTION CLAVICLE FRACTURE  1993   left  . GALLBLADDER SURGERY  2008  . HERNIA REPAIR  2011   repair from gallbladder surgery  . HERNIA REPAIR  9767   umbilical  . LITHOTRIPSY  2000  . NASAL SEPTOPLASTY W/ TURBINOPLASTY Bilateral 01/25/2014   Procedure: NASAL SEPTOPLASTY/BILATERAL INFERIOR TURBINATE REDUCTION;  Surgeon: Jerrell Belfast, MD;  Location: [facility];  Service: ENT;  Laterality: Bilateral;  . WISDOM TOOTH EXTRACTION      Social History   Socioeconomic History  . Marital status: Single    Spouse name: Not on file  . Number of children: Not on file  . Years of education: Not on file  . Highest education level: Not on file  Social Needs  . Financial resource strain: Not on file  . Food insecurity - worry: Not on file  . Food insecurity -  inability: Not on file  . Transportation needs - medical: Not on file  . Transportation needs - non-medical: Not on file  Occupational History  . Occupation: Corporate treasurer, freelance work    Employer: Therapist, occupational [employer]  Tobacco Use  . Smoking status: Never Smoker  . Smokeless tobacco: Never Used  Substance and Sexual Activity  . Alcohol use: Yes    Alcohol/week: 0.0 oz    Comment: 1 glass of wine/month  . Drug use: No  . Sexual activity: Not on file  Other Topics Concern  . Not on file  Social History Narrative   Long term relationship    Regular exercise- yes   Doing relief/[employer] PT work       Allergies as of 04/15/2017      Reactions   Minocycline Hives   hives hives hives   Morphine Nausea And Vomiting   REACTION: vomiting REACTION: vomiting REACTION: vomiting   Pseudoephedrine Other (See Comments)   Urinary retention Urinary retention Urinary retention   Topiramate Other (See Comments)   REACTION: intolerant of dose of 75mg  or higher, tolerates 50mg  dose. REACTION: intolerant of dose of 75mg  or higher, tolerates 50mg  dose. REACTION: intolerant of dose of 75mg  or higher, tolerates 50mg  dose.   Cephalexin    REACTION: rash   Citalopram Hydrobromide    Sedation,  lack of motivation   Cyclobenzaprine Hcl    REACTION: sedation   Sulfonamide Derivatives    REACTION: rash   Codeine Nausea Only   REACTION: nausea REACTION: nausea. Can tolerate hydrocodone REACTION: nausea. Can tolerate hydrocodone   Venlafaxine Nausea Only   Nausea, intolerant Nausea, intolerant Nausea, intolerant Nausea, intolerant      Medication List        Accurate as of 04/15/17 11:59 PM. Always use your most recent med list.          ALPRAZolam 0.5 MG tablet Commonly known as:  XANAX TAKE 1/4-1/2 TABLETS BY MOUTH AS NEEDED AS DIRECTED   clotrimazole-betamethasone cream Commonly known as:  LOTRISONE Apply 1 application topically 2 (two) times daily.   doxycycline  100 MG tablet Commonly known as:  VIBRA-TABS Take 1 tablet (100 mg total) 2 (two) times daily by mouth.   fluticasone 50 MCG/ACT nasal spray Commonly known as:  FLONASE Place 2 sprays into both nostrils daily.   ibuprofen 200 MG tablet Commonly known as:  ADVIL,MOTRIN Take 200 mg by mouth as needed.   levocetirizine 5 MG tablet Commonly known as:  XYZAL Take 5 mg by mouth every evening.          Objective:   Physical Exam BP 132/80 (BP Location: Left Arm, Patient Position: Sitting, Cuff Size: Small)   Pulse 75   Temp 98.1 F (36.7 C) (Oral)   Resp 14   Ht 5\' 7"  (1.702 m)   Wt 201 lb 4 oz (91.3 kg)   SpO2 96%   BMI 31.52 kg/m  General:   Well developed, well nourished . NAD.  HEENT:  Normocephalic . Face symmetric, atraumatic.  TMs normal, throat symmetric without redness or discharge.  Nose minimally congested. Maxillary sinuses: Mild tenderness bilaterally Lungs:  CTA B Normal respiratory effort, no intercostal retractions, no accessory muscle use. Heart: RRR,  no murmur.  No pretibial edema bilaterally  Skin: Not pale. Not jaundice Neurologic:  alert & oriented X3.  Speech normal, gait appropriate for age and unassisted Psych--  Cognition and judgment appear intact.  Cooperative with normal attention span and concentration.  Behavior appropriate. No anxious or depressed appearing.      Assessment & Plan:    46 y/o male of allergies presents with: Mild sinusitis: Persistent sinus congestion 4 weeks after a URI, likely mild sinusitis.  He is allergic to minocyclin  but has taking doxycycline before without any problems. Plan: doxy,  Flonase, Astelin.  See instructions

## 2017-04-15 NOTE — Telephone Encounter (Signed)
Copied from [place] [id number]. Topic: Quick Communication - See Telephone Encounter >> Apr 15, 2017  9:56 AM Ether Griffins B wrote: CRM for notification. See Telephone encounter for:  Pt having sinus infection and would like something called in.  04/15/17.

## 2017-04-15 NOTE — Patient Instructions (Addendum)
Rest, fluids , tylenol  If  cough:  Take Mucinex DM twice a day as needed until better  For nasal congestion: Use OTC Flonase : 2 nasal sprays on each side of the nose in the morning until you feel better Use ASTELIN a prescribed spray : 2 nasal sprays on each side of the nose at night until you feel better  Take the antibiotic as prescribed  (DOXY) x 1 week  Call if not gradually better over the next  10 days  Call anytime if the symptoms are severe

## 2017-04-15 NOTE — Progress Notes (Signed)
Pre visit review using our clinic review tool, if applicable. No additional management support is needed unless otherwise documented below in the visit note. 

## 2017-04-28 DIAGNOSIS — N201 Calculus of ureter: Secondary | ICD-10-CM | POA: Diagnosis not present

## 2017-04-28 DIAGNOSIS — N200 Calculus of kidney: Secondary | ICD-10-CM | POA: Diagnosis not present

## 2017-04-28 DIAGNOSIS — R109 Unspecified abdominal pain: Secondary | ICD-10-CM | POA: Diagnosis not present

## 2017-04-29 DIAGNOSIS — J301 Allergic rhinitis due to pollen: Secondary | ICD-10-CM | POA: Diagnosis not present

## 2017-04-29 DIAGNOSIS — J3081 Allergic rhinitis due to animal (cat) (dog) hair and dander: Secondary | ICD-10-CM | POA: Diagnosis not present

## 2017-05-06 DIAGNOSIS — N200 Calculus of kidney: Secondary | ICD-10-CM | POA: Diagnosis not present

## 2017-05-06 DIAGNOSIS — R109 Unspecified abdominal pain: Secondary | ICD-10-CM | POA: Diagnosis not present

## 2017-05-06 DIAGNOSIS — N201 Calculus of ureter: Secondary | ICD-10-CM | POA: Diagnosis not present

## 2017-05-07 DIAGNOSIS — N202 Calculus of kidney with calculus of ureter: Secondary | ICD-10-CM | POA: Diagnosis not present

## 2017-05-07 DIAGNOSIS — N201 Calculus of ureter: Secondary | ICD-10-CM | POA: Diagnosis not present

## 2017-05-11 DIAGNOSIS — N200 Calculus of kidney: Secondary | ICD-10-CM | POA: Diagnosis not present

## 2017-05-19 DIAGNOSIS — J301 Allergic rhinitis due to pollen: Secondary | ICD-10-CM | POA: Diagnosis not present

## 2017-05-19 DIAGNOSIS — J3081 Allergic rhinitis due to animal (cat) (dog) hair and dander: Secondary | ICD-10-CM | POA: Diagnosis not present

## 2017-05-29 DIAGNOSIS — N200 Calculus of kidney: Secondary | ICD-10-CM | POA: Diagnosis not present

## 2017-05-29 DIAGNOSIS — N201 Calculus of ureter: Secondary | ICD-10-CM | POA: Diagnosis not present

## 2017-05-29 DIAGNOSIS — N209 Urinary calculus, unspecified: Secondary | ICD-10-CM | POA: Diagnosis not present

## 2017-06-10 ENCOUNTER — Ambulatory Visit (INDEPENDENT_AMBULATORY_CARE_PROVIDER_SITE_OTHER): Payer: BLUE CROSS/BLUE SHIELD

## 2017-06-10 ENCOUNTER — Other Ambulatory Visit: Payer: BLUE CROSS/BLUE SHIELD

## 2017-06-10 ENCOUNTER — Other Ambulatory Visit: Payer: Self-pay | Admitting: Family Medicine

## 2017-06-10 ENCOUNTER — Other Ambulatory Visit (INDEPENDENT_AMBULATORY_CARE_PROVIDER_SITE_OTHER): Payer: BLUE CROSS/BLUE SHIELD

## 2017-06-10 DIAGNOSIS — G43909 Migraine, unspecified, not intractable, without status migrainosus: Secondary | ICD-10-CM

## 2017-06-10 DIAGNOSIS — Z13220 Encounter for screening for lipoid disorders: Secondary | ICD-10-CM

## 2017-06-10 DIAGNOSIS — Z111 Encounter for screening for respiratory tuberculosis: Secondary | ICD-10-CM | POA: Diagnosis not present

## 2017-06-10 LAB — LIPID PANEL
Cholesterol: 148 mg/dL (ref 0–200)
HDL: 31.3 mg/dL — AB (ref >39.00)
LDL CALC: 97 mg/dL (ref 0–99)
NONHDL: 116.74
TRIGLYCERIDES: 97 mg/dL (ref 0.0–149.0)
Total CHOL/HDL Ratio: 5
VLDL: 19.4 mg/dL (ref 0.0–40.0)

## 2017-06-10 LAB — BASIC METABOLIC PANEL
BUN: 12 mg/dL (ref 6–23)
CO2: 31 mEq/L (ref 19–32)
Calcium: 9.3 mg/dL (ref 8.4–10.5)
Chloride: 104 mEq/L (ref 96–112)
Creatinine, Ser: 1.07 mg/dL (ref 0.40–1.50)
GFR: 78.6 mL/min (ref >60.00)
Glucose, Bld: 94 mg/dL (ref 70–99)
Potassium: 3.5 mEq/L (ref 3.5–5.1)
SODIUM: 143 meq/L (ref 135–145)

## 2017-06-12 ENCOUNTER — Encounter: Payer: Self-pay | Admitting: Family Medicine

## 2017-06-12 ENCOUNTER — Ambulatory Visit (INDEPENDENT_AMBULATORY_CARE_PROVIDER_SITE_OTHER): Payer: BLUE CROSS/BLUE SHIELD | Admitting: Family Medicine

## 2017-06-12 VITALS — BP 120/70 | HR 76 | Temp 98.9°F | Ht 67.0 in | Wt 202.2 lb

## 2017-06-12 DIAGNOSIS — G43909 Migraine, unspecified, not intractable, without status migrainosus: Secondary | ICD-10-CM

## 2017-06-12 DIAGNOSIS — Z0001 Encounter for general adult medical examination with abnormal findings: Secondary | ICD-10-CM

## 2017-06-12 DIAGNOSIS — Z7189 Other specified counseling: Secondary | ICD-10-CM

## 2017-06-12 DIAGNOSIS — Z0000 Encounter for general adult medical examination without abnormal findings: Secondary | ICD-10-CM | POA: Diagnosis not present

## 2017-06-12 NOTE — Progress Notes (Signed)
CPE- See plan.  Routine anticipatory guidance given to patient.  See health maintenance.  The possibility exists that previously documented standard health maintenance information may have been brought forward from a previous encounter into this note.  If needed, that same information has been updated to reflect the current situation based on today's encounter.    Tetanus 2013 Flu 2018 PNA and shingles not due.  Colon and prostate cancer screening not due.   Living will d/w pt.  Bethanne Ginger designated if patient were incapacitated.   Diet and exercise d/w pt.  Encouraged both.   HIV and HCV prev done at [facility] ~1991.    Recent PPD negative, no skin changes on the L arm.   Lipids d/w pt.  Father with h/o vascular disease.  D/w pt.  ASCVD score: 2.5%, d/w pt- that doesn't include FH but his father was a heavy smoker.    Rare use of small amount of xanax for migraines.   He is going to call about f/u with ENT at [facility].  He'll update me as needed.    PMH and SH reviewed  Meds, vitals, and allergies reviewed.   ROS: Per HPI.  Unless specifically indicated otherwise in HPI, the patient denies:  General: fever. Eyes: acute vision changes ENT: sore throat Cardiovascular: chest pain Respiratory: SOB GI: vomiting GU: dysuria Musculoskeletal: acute back pain Derm: acute rash Neuro: acute motor dysfunction Psych: worsening mood Endocrine: polydipsia Heme: bleeding Allergy: hayfever  GEN: nad, alert and oriented HEENT: mucous membranes moist NECK: supple w/o LA CV: rrr. PULM: ctab, no inc wob ABD: soft, +bs EXT: no edema SKIN: no acute rash, PPD site w/o reaction on L forearm.

## 2017-06-12 NOTE — Patient Instructions (Signed)
Take a picture of the PPD site on your left arm after 4PM today and prior to 4PM tomorrow.  Send me the picture and we'll send you a message.   Take care.  Glad to see you.  Update me as needed.

## 2017-06-14 DIAGNOSIS — Z7189 Other specified counseling: Secondary | ICD-10-CM

## 2017-06-14 DIAGNOSIS — Z0001 Encounter for general adult medical examination with abnormal findings: Secondary | ICD-10-CM | POA: Insufficient documentation

## 2017-06-14 HISTORY — DX: Other specified counseling: Z71.89

## 2017-06-14 NOTE — Assessment & Plan Note (Signed)
Tetanus 2013 Flu 2018 PNA and shingles not due.  Colon and prostate cancer screening not due.   Living will d/w pt.  Bethanne Ginger designated if patient were incapacitated.   Diet and exercise d/w pt.  Encouraged both.   HIV and HCV prev done at [facility] ~1991.

## 2017-06-14 NOTE — Assessment & Plan Note (Signed)
Living will d/w pt.  [name] designated if patient were incapacitated.

## 2017-06-14 NOTE — Assessment & Plan Note (Signed)
Rare use of benzodiazepine.  Intolerant of other other medications.  Continue with as needed use.  Update [place] as needed.  No adverse effect of medication.

## 2017-06-15 DIAGNOSIS — J301 Allergic rhinitis due to pollen: Secondary | ICD-10-CM | POA: Diagnosis not present

## 2017-06-15 DIAGNOSIS — J3081 Allergic rhinitis due to animal (cat) (dog) hair and dander: Secondary | ICD-10-CM | POA: Diagnosis not present

## 2017-06-24 ENCOUNTER — Encounter: Payer: Self-pay | Admitting: Family Medicine

## 2017-07-01 ENCOUNTER — Ambulatory Visit: Payer: BLUE CROSS/BLUE SHIELD | Admitting: Family Medicine

## 2017-07-01 ENCOUNTER — Encounter: Payer: Self-pay | Admitting: Family Medicine

## 2017-07-01 VITALS — BP 124/82 | HR 78 | Temp 98.8°F | Wt 203.8 lb

## 2017-07-01 DIAGNOSIS — J069 Acute upper respiratory infection, unspecified: Secondary | ICD-10-CM | POA: Diagnosis not present

## 2017-07-01 LAB — CBC WITH DIFFERENTIAL/PLATELET
Basophils Absolute: 0 10*3/uL (ref 0.0–0.1)
Basophils Relative: 0.6 % (ref 0.0–3.0)
Eosinophils Absolute: 0.2 10*3/uL (ref 0.0–0.7)
Eosinophils Relative: 2.1 % (ref 0.0–5.0)
HEMATOCRIT: 46.5 % (ref 39.0–52.0)
Hemoglobin: 16.4 g/dL (ref 13.0–17.0)
LYMPHS PCT: 23.5 % (ref 12.0–46.0)
Lymphs Abs: 1.9 10*3/uL (ref 0.7–4.0)
MCHC: 35.3 g/dL (ref 30.0–36.0)
MCV: 91.2 fl (ref 78.0–100.0)
MONOS PCT: 11.7 % (ref 3.0–12.0)
Monocytes Absolute: 1 10*3/uL (ref 0.1–1.0)
NEUTROS ABS: 5.1 10*3/uL (ref 1.4–7.7)
Neutrophils Relative %: 62.1 % (ref 43.0–77.0)
PLATELETS: 230 10*3/uL (ref 150.0–400.0)
RBC: 5.09 Mil/uL (ref 4.22–5.81)
RDW: 13.1 % (ref 11.5–15.5)
WBC: 8.2 10*3/uL (ref 4.0–10.5)

## 2017-07-01 NOTE — Progress Notes (Signed)
Sx started about 2 weeks ago.  Congestion and rhinorrhea-brownish.  Nausea and GI upset.  He thought it was a cold initially.  He had some L sided HAs, frontal pain.  dayquil helped with HA.  He has some taste changes, with a metallic change in his mouth.    He has had some B knee and hip aches in the meantime.    He has felt diffusely weak and a little more irritable.  His concentration isn't as good.  His sleep isn't as good recently, interrupted sleep at night.    He doesn't feel emergently unwell, but doesn't feel like himself.  No known fevers.  T max <99.    He had aches with levaquin use prev, during treatment for renal stone.    I gave him a copy of his PPD letter, d/w pt.    Meds, vitals, and allergies reviewed.   ROS: Per HPI unless specifically indicated in ROS section   GEN: nad, alert and oriented, pleasant in conversation HEENT: mucous membranes moist, TM wnl B, mild nasal irritation, OP wnl, sinuses not ttp  NECK: supple w/o LA CV: rrr PULM: ctab, no inc wob ABD: soft, +bs EXT: no edema SKIN: no acute rash CN 2-12 wnl B, S/S/DTR wnl x4

## 2017-07-01 NOTE — Patient Instructions (Signed)
Check CBC on the way out today.   If not getting better then let me know.  Take care.  Glad to see you.

## 2017-07-02 DIAGNOSIS — J069 Acute upper respiratory infection, unspecified: Secondary | ICD-10-CM | POA: Insufficient documentation

## 2017-07-02 NOTE — Assessment & Plan Note (Signed)
Nontoxic, okay for outpatient f/u.  D/w pt about ddx.  Could have benign viral issue, with sx disrupting sleep, causing his sx.  Would check CBC to see if that reveals any sig information.  He agrees.  Update me as needed.  Benign exam.

## 2017-07-07 DIAGNOSIS — J301 Allergic rhinitis due to pollen: Secondary | ICD-10-CM | POA: Diagnosis not present

## 2017-07-07 DIAGNOSIS — J3081 Allergic rhinitis due to animal (cat) (dog) hair and dander: Secondary | ICD-10-CM | POA: Diagnosis not present

## 2017-07-16 ENCOUNTER — Encounter: Payer: Self-pay | Admitting: Family Medicine

## 2017-07-17 ENCOUNTER — Other Ambulatory Visit: Payer: Self-pay | Admitting: Family Medicine

## 2017-07-17 MED ORDER — OSELTAMIVIR PHOSPHATE 75 MG PO CAPS: 75.0000 mg | ORAL_CAPSULE | Freq: Every day | ORAL | 0 refills | Status: DC

## 2017-07-30 DIAGNOSIS — J301 Allergic rhinitis due to pollen: Secondary | ICD-10-CM | POA: Diagnosis not present

## 2017-07-30 DIAGNOSIS — J3081 Allergic rhinitis due to animal (cat) (dog) hair and dander: Secondary | ICD-10-CM | POA: Diagnosis not present

## 2017-08-10 DIAGNOSIS — M2242 Chondromalacia patellae, left knee: Secondary | ICD-10-CM | POA: Diagnosis not present

## 2017-08-27 DIAGNOSIS — J301 Allergic rhinitis due to pollen: Secondary | ICD-10-CM | POA: Diagnosis not present

## 2017-08-27 DIAGNOSIS — J3081 Allergic rhinitis due to animal (cat) (dog) hair and dander: Secondary | ICD-10-CM | POA: Diagnosis not present

## 2017-09-03 DIAGNOSIS — J3081 Allergic rhinitis due to animal (cat) (dog) hair and dander: Secondary | ICD-10-CM | POA: Diagnosis not present

## 2017-09-03 DIAGNOSIS — J301 Allergic rhinitis due to pollen: Secondary | ICD-10-CM | POA: Diagnosis not present

## 2017-09-17 DIAGNOSIS — J3081 Allergic rhinitis due to animal (cat) (dog) hair and dander: Secondary | ICD-10-CM | POA: Diagnosis not present

## 2017-09-17 DIAGNOSIS — J301 Allergic rhinitis due to pollen: Secondary | ICD-10-CM | POA: Diagnosis not present

## 2017-10-12 DIAGNOSIS — J3081 Allergic rhinitis due to animal (cat) (dog) hair and dander: Secondary | ICD-10-CM | POA: Diagnosis not present

## 2017-10-12 DIAGNOSIS — J301 Allergic rhinitis due to pollen: Secondary | ICD-10-CM | POA: Diagnosis not present

## 2017-10-27 ENCOUNTER — Other Ambulatory Visit: Payer: Self-pay | Admitting: Family Medicine

## 2017-10-27 NOTE — Telephone Encounter (Signed)
Electronic refill request Last refill 02/06/17- 30 G Last office visit 07/01/17

## 2017-10-28 NOTE — Telephone Encounter (Signed)
Sent. Thanks.   

## 2017-10-29 DIAGNOSIS — J301 Allergic rhinitis due to pollen: Secondary | ICD-10-CM | POA: Diagnosis not present

## 2017-10-29 DIAGNOSIS — M2242 Chondromalacia patellae, left knee: Secondary | ICD-10-CM | POA: Diagnosis not present

## 2017-10-29 DIAGNOSIS — J3081 Allergic rhinitis due to animal (cat) (dog) hair and dander: Secondary | ICD-10-CM | POA: Diagnosis not present

## 2017-11-05 DIAGNOSIS — M2242 Chondromalacia patellae, left knee: Secondary | ICD-10-CM | POA: Diagnosis not present

## 2017-11-05 DIAGNOSIS — M23232 Derangement of other medial meniscus due to old tear or injury, left knee: Secondary | ICD-10-CM | POA: Diagnosis not present

## 2017-11-05 DIAGNOSIS — M1712 Unilateral primary osteoarthritis, left knee: Secondary | ICD-10-CM | POA: Diagnosis not present

## 2017-11-05 DIAGNOSIS — M94262 Chondromalacia, left knee: Secondary | ICD-10-CM | POA: Diagnosis not present

## 2017-11-11 DIAGNOSIS — J301 Allergic rhinitis due to pollen: Secondary | ICD-10-CM | POA: Diagnosis not present

## 2017-11-11 DIAGNOSIS — J3081 Allergic rhinitis due to animal (cat) (dog) hair and dander: Secondary | ICD-10-CM | POA: Diagnosis not present

## 2017-11-23 DIAGNOSIS — M23204 Derangement of unspecified medial meniscus due to old tear or injury, left knee: Secondary | ICD-10-CM | POA: Diagnosis not present

## 2017-11-23 DIAGNOSIS — M2242 Chondromalacia patellae, left knee: Secondary | ICD-10-CM | POA: Diagnosis not present

## 2017-12-08 DIAGNOSIS — J301 Allergic rhinitis due to pollen: Secondary | ICD-10-CM | POA: Diagnosis not present

## 2017-12-08 DIAGNOSIS — J3081 Allergic rhinitis due to animal (cat) (dog) hair and dander: Secondary | ICD-10-CM | POA: Diagnosis not present

## 2017-12-09 DIAGNOSIS — Z91018 Allergy to other foods: Secondary | ICD-10-CM | POA: Diagnosis not present

## 2017-12-09 DIAGNOSIS — J3081 Allergic rhinitis due to animal (cat) (dog) hair and dander: Secondary | ICD-10-CM | POA: Diagnosis not present

## 2017-12-09 DIAGNOSIS — J45990 Exercise induced bronchospasm: Secondary | ICD-10-CM | POA: Diagnosis not present

## 2017-12-09 DIAGNOSIS — J329 Chronic sinusitis, unspecified: Secondary | ICD-10-CM | POA: Diagnosis not present

## 2017-12-09 DIAGNOSIS — J301 Allergic rhinitis due to pollen: Secondary | ICD-10-CM | POA: Diagnosis not present

## 2017-12-29 DIAGNOSIS — J3081 Allergic rhinitis due to animal (cat) (dog) hair and dander: Secondary | ICD-10-CM | POA: Diagnosis not present

## 2017-12-29 DIAGNOSIS — J301 Allergic rhinitis due to pollen: Secondary | ICD-10-CM | POA: Diagnosis not present

## 2018-01-21 DIAGNOSIS — J301 Allergic rhinitis due to pollen: Secondary | ICD-10-CM | POA: Diagnosis not present

## 2018-01-21 DIAGNOSIS — J3081 Allergic rhinitis due to animal (cat) (dog) hair and dander: Secondary | ICD-10-CM | POA: Diagnosis not present

## 2018-02-09 ENCOUNTER — Encounter: Payer: Self-pay | Admitting: Family Medicine

## 2018-02-09 DIAGNOSIS — J3081 Allergic rhinitis due to animal (cat) (dog) hair and dander: Secondary | ICD-10-CM | POA: Diagnosis not present

## 2018-02-09 DIAGNOSIS — J301 Allergic rhinitis due to pollen: Secondary | ICD-10-CM | POA: Diagnosis not present

## 2018-02-11 ENCOUNTER — Encounter: Payer: Self-pay | Admitting: Family Medicine

## 2018-02-11 ENCOUNTER — Ambulatory Visit: Payer: BLUE CROSS/BLUE SHIELD | Admitting: Family Medicine

## 2018-02-11 VITALS — BP 132/70 | HR 94 | Temp 98.8°F | Ht 67.0 in | Wt 201.5 lb

## 2018-02-11 DIAGNOSIS — Z23 Encounter for immunization: Secondary | ICD-10-CM | POA: Diagnosis not present

## 2018-02-11 DIAGNOSIS — Z7189 Other specified counseling: Secondary | ICD-10-CM | POA: Diagnosis not present

## 2018-02-11 DIAGNOSIS — Z7184 Encounter for health counseling related to travel: Secondary | ICD-10-CM

## 2018-02-11 MED ORDER — TYPHOID VACCINE PO CPDR: 1.0000 | DELAYED_RELEASE_CAPSULE | ORAL | 0 refills | Status: DC

## 2018-02-11 MED ORDER — ALPRAZOLAM 0.5 MG PO TABS
ORAL_TABLET | ORAL | 0 refills | Status: DC
Start: 2018-02-11 — End: 2018-02-11

## 2018-02-11 MED ORDER — LEVOCETIRIZINE DIHYDROCHLORIDE 5 MG PO TABS: 5.0000 mg | ORAL_TABLET | Freq: Every evening | ORAL | Status: DC

## 2018-02-11 MED ORDER — ALPRAZOLAM 0.5 MG PO TABS: ORAL_TABLET | ORAL | 0 refills | Status: DC

## 2018-02-11 NOTE — Patient Instructions (Signed)
Start typhoid vaccine.  Update me as needed.  First dose of HAV today.  Flu shot today.  Take care.  Glad to see you.

## 2018-02-11 NOTE — Assessment & Plan Note (Signed)
Measles immune prev.   HBV vaccine prev done.  Wouldn't be in malaria prone area.  Reasonable get HAV, flu and typhoid vaccine.  D/w pt.   D/w pt about xanax with air travel.  rx printed.  Routine cautions given. He can use Pepto-Bismol as prophylaxis for traveler's diarrhea. Return for second dose of hepatitis A vaccine in 6 months.

## 2018-02-11 NOTE — Progress Notes (Signed)
Traveling to [place] and then [place].  D/w pt about travel plans.  See my chart message.  He feels well.  No acute complaints.  We talked about routine cautions with travel.  CDC site reviewed regarding vaccination, etc.  Measles immune prev.   HBV vaccine prev done.  Wouldn't be in malaria prone area.  Reasonable get HAV, flu and typhoid vaccine.  D/w pt.   D/w pt about xanax with travel.  rx printed.    Meds, vitals, and allergies reviewed.   ROS: Per HPI unless specifically indicated in ROS section   nad ncat rrr ctab abd soft Ext w/o edema.

## 2018-02-23 ENCOUNTER — Encounter: Payer: Self-pay | Admitting: Family Medicine

## 2018-02-24 DIAGNOSIS — J301 Allergic rhinitis due to pollen: Secondary | ICD-10-CM | POA: Diagnosis not present

## 2018-02-24 DIAGNOSIS — J3081 Allergic rhinitis due to animal (cat) (dog) hair and dander: Secondary | ICD-10-CM | POA: Diagnosis not present

## 2018-02-25 ENCOUNTER — Telehealth: Payer: Self-pay | Admitting: Family Medicine

## 2018-02-25 MED ORDER — AZITHROMYCIN 250 MG PO TABS: ORAL_TABLET | ORAL | 0 refills | Status: DC

## 2018-02-25 NOTE — Telephone Encounter (Signed)
Given the situation, I would start zmax.  Thanks for the extra information.  Just make sure he is done with the typhoid vaccine prior to starting the abx.  rx sent.  Thanks.

## 2018-02-25 NOTE — Telephone Encounter (Signed)
See my chart message.  Please triage him.  Let me know about symptoms, duration.  Thanks.

## 2018-02-25 NOTE — Telephone Encounter (Signed)
I spoke with pt; pt last seen 02/11/18;pt going out of country 03/02/18; pt has head congestion, S/T due to drainage, pressure at eye socket on rt side and effected vision in rt eye by pt feels pressure on back side of eye; slight difficulty in focusing rt eye. Both ears feel stopped up but worse on rt side. Also H/A. When blows nose rusty colored mucus. No fever. Pt request [facility] pak or other ab[facility] in [place].Please advise.

## 2018-02-25 NOTE — Telephone Encounter (Signed)
Patient advised.

## 2018-03-22 DIAGNOSIS — J301 Allergic rhinitis due to pollen: Secondary | ICD-10-CM | POA: Diagnosis not present

## 2018-03-22 DIAGNOSIS — J3081 Allergic rhinitis due to animal (cat) (dog) hair and dander: Secondary | ICD-10-CM | POA: Diagnosis not present

## 2018-03-28 DIAGNOSIS — J301 Allergic rhinitis due to pollen: Secondary | ICD-10-CM | POA: Diagnosis not present

## 2018-03-28 DIAGNOSIS — J3081 Allergic rhinitis due to animal (cat) (dog) hair and dander: Secondary | ICD-10-CM | POA: Diagnosis not present

## 2018-04-26 DIAGNOSIS — J3081 Allergic rhinitis due to animal (cat) (dog) hair and dander: Secondary | ICD-10-CM | POA: Diagnosis not present

## 2018-04-26 DIAGNOSIS — J301 Allergic rhinitis due to pollen: Secondary | ICD-10-CM | POA: Diagnosis not present

## 2018-05-07 ENCOUNTER — Ambulatory Visit: Payer: BLUE CROSS/BLUE SHIELD | Admitting: Family Medicine

## 2018-05-10 ENCOUNTER — Ambulatory Visit: Payer: BLUE CROSS/BLUE SHIELD | Admitting: Family Medicine

## 2018-05-10 ENCOUNTER — Encounter: Payer: Self-pay | Admitting: Family Medicine

## 2018-05-10 VITALS — BP 118/72 | HR 75 | Temp 99.3°F | Ht 67.0 in | Wt 204.8 lb

## 2018-05-10 DIAGNOSIS — R300 Dysuria: Secondary | ICD-10-CM

## 2018-05-10 DIAGNOSIS — R52 Pain, unspecified: Secondary | ICD-10-CM | POA: Diagnosis not present

## 2018-05-10 LAB — POC URINALSYSI DIPSTICK (AUTOMATED)
Bilirubin, UA: NEGATIVE
Blood, UA: NEGATIVE
Glucose, UA: NEGATIVE
KETONES UA: NEGATIVE
Leukocytes, UA: NEGATIVE
Nitrite, UA: NEGATIVE
Protein, UA: NEGATIVE
Spec Grav, UA: >=1.03 — AB (ref 1.010–1.025)
Urobilinogen, UA: 0.2 E.U./dL
pH, UA: 6 (ref 5.0–8.0)

## 2018-05-10 LAB — COMPREHENSIVE METABOLIC PANEL
ALBUMIN: 4.5 g/dL (ref 3.5–5.2)
ALT: 30 U/L (ref 0–53)
AST: 30 U/L (ref 0–37)
Alkaline Phosphatase: 70 U/L (ref 39–117)
BUN: 11 mg/dL (ref 6–23)
CO2: 31 mEq/L (ref 19–32)
Calcium: 9.4 mg/dL (ref 8.4–10.5)
Chloride: 104 mEq/L (ref 96–112)
Creatinine, Ser: 1.08 mg/dL (ref 0.40–1.50)
GFR: 77.46 mL/min (ref >60.00)
Glucose, Bld: 73 mg/dL (ref 70–99)
Potassium: 3.5 mEq/L (ref 3.5–5.1)
Sodium: 143 mEq/L (ref 135–145)
Total Bilirubin: 1 mg/dL (ref 0.2–1.2)
Total Protein: 7.1 g/dL (ref 6.0–8.3)

## 2018-05-10 LAB — CBC WITH DIFFERENTIAL/PLATELET
Basophils Absolute: 0 10*3/uL (ref 0.0–0.1)
Basophils Relative: 0.6 % (ref 0.0–3.0)
EOS PCT: 3.8 % (ref 0.0–5.0)
Eosinophils Absolute: 0.2 10*3/uL (ref 0.0–0.7)
HCT: 47 % (ref 39.0–52.0)
Hemoglobin: 16.2 g/dL (ref 13.0–17.0)
Lymphocytes Relative: 35.2 % (ref 12.0–46.0)
Lymphs Abs: 2.2 10*3/uL (ref 0.7–4.0)
MCHC: 34.4 g/dL (ref 30.0–36.0)
MCV: 92.6 fl (ref 78.0–100.0)
Monocytes Absolute: 0.8 10*3/uL (ref 0.1–1.0)
Monocytes Relative: 12.8 % — ABNORMAL HIGH (ref 3.0–12.0)
Neutro Abs: 2.9 10*3/uL (ref 1.4–7.7)
Neutrophils Relative %: 47.6 % (ref 43.0–77.0)
Platelets: 230 10*3/uL (ref 150.0–400.0)
RBC: 5.08 Mil/uL (ref 4.22–5.81)
RDW: 12.9 % (ref 11.5–15.5)
WBC: 6.2 10*3/uL (ref 4.0–10.5)

## 2018-05-10 NOTE — Patient Instructions (Signed)
Stop doxycycline.  Go to the lab on the way out.  We'll contact you with your lab report. Take care.  Glad to see you.  Drink plenty of fluids in the meantime.

## 2018-05-10 NOTE — Progress Notes (Signed)
First noted HA and sinus pressure.  That was about 3 weeks ago.  He was taking advil and baseline allergy meds.  He rested but was still fatigued.  He had some joint and muscle aches.  He didn't have ST.  Then about 10 days ago had bloody rhinorrhea.  He had a leftover doxy rx and started in the meantime, has been on for 9 days.  HA on the top of the head.  He had nausea and sensation of imbalance episodically.    Advil helps the aches.  Minimal discomfort with urination with initial and terminal part of stream.    He has sick exposures at work.    He prev had uneventful trip to [place].    Meds, vitals, and allergies reviewed.   ROS: Per HPI unless specifically indicated in ROS section   GEN: nad, alert and oriented HEENT: mucous membranes moist, tympanic membranes within normal limits.  Nasal epithelium without erythema.  Oropharynx without exudates. NECK: supple w/o LA CV: rrr. PULM: ctab, no inc wob ABD: soft, +bs.  He has B lateral abd tenderness w/o rebound.  This appears to be limited to the abdominal wall itself. EXT: no edema SKIN: no acute rash

## 2018-05-12 DIAGNOSIS — R52 Pain, unspecified: Secondary | ICD-10-CM | POA: Insufficient documentation

## 2018-05-12 HISTORY — DX: Pain, unspecified: R52

## 2018-05-12 NOTE — Assessment & Plan Note (Signed)
Nontoxic.  Reasonable to check routine labs given his recent symptoms, sick exposures and his recent trip to [place].  Unlikely to benefit from further antibiotics at this point.  Stop doxycycline.  Okay for outpatient follow-up.  Rest and fluids otherwise.

## 2018-06-09 DIAGNOSIS — J45990 Exercise induced bronchospasm: Secondary | ICD-10-CM | POA: Diagnosis not present

## 2018-06-09 DIAGNOSIS — J3081 Allergic rhinitis due to animal (cat) (dog) hair and dander: Secondary | ICD-10-CM | POA: Diagnosis not present

## 2018-06-09 DIAGNOSIS — J329 Chronic sinusitis, unspecified: Secondary | ICD-10-CM | POA: Diagnosis not present

## 2018-06-09 DIAGNOSIS — J301 Allergic rhinitis due to pollen: Secondary | ICD-10-CM | POA: Diagnosis not present

## 2018-06-09 DIAGNOSIS — Z91018 Allergy to other foods: Secondary | ICD-10-CM | POA: Diagnosis not present

## 2018-06-22 DIAGNOSIS — J3089 Other allergic rhinitis: Secondary | ICD-10-CM | POA: Diagnosis not present

## 2018-06-22 DIAGNOSIS — J0141 Acute recurrent pansinusitis: Secondary | ICD-10-CM | POA: Diagnosis not present

## 2018-06-24 DIAGNOSIS — J301 Allergic rhinitis due to pollen: Secondary | ICD-10-CM | POA: Diagnosis not present

## 2018-06-24 DIAGNOSIS — J3081 Allergic rhinitis due to animal (cat) (dog) hair and dander: Secondary | ICD-10-CM | POA: Diagnosis not present

## 2018-07-05 ENCOUNTER — Encounter: Payer: Self-pay | Admitting: Family Medicine

## 2018-07-06 ENCOUNTER — Telehealth: Payer: Self-pay | Admitting: Family Medicine

## 2018-07-06 NOTE — Telephone Encounter (Signed)
Appointment scheduled.

## 2018-07-06 NOTE — Telephone Encounter (Signed)
See mychart message. Please get update from patient about his situation.  He is likely going to need OV/eval.

## 2018-07-08 ENCOUNTER — Encounter: Payer: Self-pay | Admitting: Family Medicine

## 2018-07-08 ENCOUNTER — Ambulatory Visit: Payer: BLUE CROSS/BLUE SHIELD | Admitting: Family Medicine

## 2018-07-08 DIAGNOSIS — L989 Disorder of the skin and subcutaneous tissue, unspecified: Secondary | ICD-10-CM

## 2018-07-08 MED ORDER — MUPIROCIN 2 % EX OINT: 1.0000 "application " | TOPICAL_OINTMENT | Freq: Two times a day (BID) | CUTANEOUS | 0 refills | Status: DC

## 2018-07-08 MED ORDER — DOXYCYCLINE HYCLATE 100 MG PO TABS: 100.0000 mg | ORAL_TABLET | Freq: Two times a day (BID) | ORAL | 0 refills | Status: DC

## 2018-07-08 NOTE — Patient Instructions (Signed)
Finish the doxy and keep using bactroban for now.  This should heal over.   Take care.  Glad to see you.

## 2018-07-08 NOTE — Progress Notes (Signed)
Skin lesion.  Using topical tx in the meantime.  He doesn't have red streaks.  Already on doxy.  No fevers.  No drainage.  Local abrasion initially, he cleaned the area.    Meds, vitals, and allergies reviewed.   ROS: Per HPI unless specifically indicated in ROS section    GEN: nad, alert and oriented NECK: supple w/o LA EXT: no edema SKIN: 1cm round lesion on R posterior shin, scabbed.  60mm round lesion on R posterior shin, scabbed.  No fluctuant mass.  Blanching local erythema.    It looks better compared to prev photos, d/w pt.

## 2018-07-11 NOTE — Assessment & Plan Note (Signed)
Likely healing from initial abrasion.  Discussed options. Finish the doxy and keep using bactroban for now.  This should heal over.  He agrees with plan.

## 2018-07-22 ENCOUNTER — Encounter: Payer: Self-pay | Admitting: Family Medicine

## 2018-07-23 ENCOUNTER — Telehealth: Payer: Self-pay | Admitting: Family Medicine

## 2018-07-23 NOTE — Telephone Encounter (Signed)
I would assume a patient to be contagious for 24 hours minimum after the fever breaks.  If he is better in the meantime and w/o fever, then I would just treat this supportively in the meantime with rest/fluids/OTC cold meds.   Thanks.

## 2018-07-23 NOTE — Telephone Encounter (Signed)
Patient advised.

## 2018-07-23 NOTE — Telephone Encounter (Signed)
Spoke to patient and was advised that he started Monday with fever, body aches, nausea and diarrhea. Patient stated that he had Tamiflu left over from last year, so he took a dose Tuesday and Wednesday. Patient stated that his fever broke Thursday. Patient stated that now he has symptoms of a typical cold, such as sore throat, headache, sinus drainage. Patient stated that now he is just treating his symptoms. Patient wants to know how long you think that he is contagious? Marland Kitchen

## 2018-07-23 NOTE — Telephone Encounter (Signed)
Please triage the patient.  See my chart message.  Thanks.

## 2018-07-23 NOTE — Telephone Encounter (Signed)
Unable to reach pt by phone; left v/m requesting pt to cb. 

## 2018-07-27 DIAGNOSIS — J301 Allergic rhinitis due to pollen: Secondary | ICD-10-CM | POA: Diagnosis not present

## 2018-07-27 DIAGNOSIS — J3081 Allergic rhinitis due to animal (cat) (dog) hair and dander: Secondary | ICD-10-CM | POA: Diagnosis not present

## 2018-07-27 DIAGNOSIS — J329 Chronic sinusitis, unspecified: Secondary | ICD-10-CM | POA: Diagnosis not present

## 2018-07-27 DIAGNOSIS — R51 Headache: Secondary | ICD-10-CM | POA: Diagnosis not present

## 2018-07-27 DIAGNOSIS — Z91018 Allergy to other foods: Secondary | ICD-10-CM | POA: Diagnosis not present

## 2018-07-27 DIAGNOSIS — J45990 Exercise induced bronchospasm: Secondary | ICD-10-CM | POA: Diagnosis not present

## 2018-08-01 DIAGNOSIS — J301 Allergic rhinitis due to pollen: Secondary | ICD-10-CM | POA: Diagnosis not present

## 2018-08-05 DIAGNOSIS — J301 Allergic rhinitis due to pollen: Secondary | ICD-10-CM | POA: Diagnosis not present

## 2018-08-05 DIAGNOSIS — J3081 Allergic rhinitis due to animal (cat) (dog) hair and dander: Secondary | ICD-10-CM | POA: Diagnosis not present

## 2018-08-12 DIAGNOSIS — J301 Allergic rhinitis due to pollen: Secondary | ICD-10-CM | POA: Diagnosis not present

## 2018-08-12 DIAGNOSIS — J3081 Allergic rhinitis due to animal (cat) (dog) hair and dander: Secondary | ICD-10-CM | POA: Diagnosis not present

## 2018-09-09 DIAGNOSIS — J301 Allergic rhinitis due to pollen: Secondary | ICD-10-CM | POA: Diagnosis not present

## 2018-09-09 DIAGNOSIS — J3081 Allergic rhinitis due to animal (cat) (dog) hair and dander: Secondary | ICD-10-CM | POA: Diagnosis not present

## 2018-10-11 ENCOUNTER — Encounter: Payer: Self-pay | Admitting: Family Medicine

## 2018-10-11 ENCOUNTER — Other Ambulatory Visit: Payer: Self-pay | Admitting: Family Medicine

## 2018-10-11 MED ORDER — LEVOCETIRIZINE DIHYDROCHLORIDE 5 MG PO TABS: 5.0000 mg | ORAL_TABLET | Freq: Every evening | ORAL | Status: DC

## 2018-10-11 NOTE — Telephone Encounter (Signed)
Electronic refill request. Alprazolam Last office visit:   07/08/2018 Last Filled:     30 tablet 0 02/11/2018  Please advise.

## 2018-10-12 MED ORDER — ALPRAZOLAM 0.5 MG PO TABS: ORAL_TABLET | ORAL | 0 refills | Status: DC

## 2018-10-12 NOTE — Telephone Encounter (Signed)
Sent. Thanks.   

## 2018-10-13 ENCOUNTER — Encounter: Payer: Self-pay | Admitting: Family Medicine

## 2018-10-13 MED ORDER — LEVOCETIRIZINE DIHYDROCHLORIDE 5 MG PO TABS: 5.0000 mg | ORAL_TABLET | Freq: Every evening | ORAL | 11 refills | Status: DC

## 2018-12-31 ENCOUNTER — Encounter: Payer: Self-pay | Admitting: Family Medicine

## 2019-01-04 ENCOUNTER — Ambulatory Visit: Payer: BC Managed Care – PPO | Admitting: Family Medicine

## 2019-01-04 ENCOUNTER — Encounter: Payer: Self-pay | Admitting: Family Medicine

## 2019-01-04 ENCOUNTER — Telehealth: Payer: Self-pay | Admitting: Family Medicine

## 2019-01-04 ENCOUNTER — Other Ambulatory Visit: Payer: Self-pay

## 2019-01-04 ENCOUNTER — Ambulatory Visit (INDEPENDENT_AMBULATORY_CARE_PROVIDER_SITE_OTHER)
Admission: RE | Admit: 2019-01-04 | Discharge: 2019-01-04 | Disposition: A | Discharge status: Home / Self Care | Payer: BC Managed Care – PPO | Source: Ambulatory Visit | Attending: Family Medicine | Admitting: Family Medicine

## 2019-01-04 VITALS — BP 142/84 | HR 53 | Temp 98.1°F | Ht 67.0 in | Wt 202.5 lb

## 2019-01-04 DIAGNOSIS — M542 Cervicalgia: Secondary | ICD-10-CM | POA: Diagnosis not present

## 2019-01-04 DIAGNOSIS — M47812 Spondylosis without myelopathy or radiculopathy, cervical region: Secondary | ICD-10-CM | POA: Diagnosis not present

## 2019-01-04 MED ORDER — PREDNISONE 10 MG PO TABS: ORAL_TABLET | ORAL | 0 refills | Status: DC

## 2019-01-04 NOTE — Progress Notes (Signed)
Pt reports neck pain x 2 weeks. Pt states that he was laying in bed, when he turned his head and rolled over he heard a crunching sound and had tingling up the back of his neck/base of skull. Pt reports several neck injuries as a child. States that forward movements seem to cause the most strain on neck, side to side motion causes tingling and crunching sounds. Pain is mostly at base of skull. Pt states that he is also having muscle tension from guarding his muscles d/t pain.  Pain is worse with activity.  He can occ get a little vertigo sx but not lightheaded. His neck sx are some better overall but not resolved .    covid cautions d/w pt.  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  He is doing some home projects.  He has some B medial knee pain and some shoulder pain.    Some fatigue noted.  Meds, vitals, and allergies reviewed.   ROS: Per HPI unless specifically indicated in ROS section   GEN: nad, alert and oriented HEENT: ncat NECK: supple w/o LA, no crepitus or popping in ROM.   EXT: no edema SKIN: no acute rash Normal neck ROM. S/S wnl x4.  CN 2-12 wnl.

## 2019-01-04 NOTE — Patient Instructions (Signed)
Go to the lab on the way out.  We'll contact you with your xray report. Take prednisone taper and update me as needed.  Take care.  Glad to see you.

## 2019-01-04 NOTE — Telephone Encounter (Signed)
I spoke with pt and for 2 wks on and off has neck pain and crunching sound when moves neck on and off. Dizziness and H/A on and off.pt does not have dizziness today and thinks he can relate H/A to the neck pain,. No other covid symptoms, no travel and no known exposure to covid. Pt scheduled in office visit today with Dr Damita Dunnings at 9:30. FYI to Dr Damita Dunnings.

## 2019-01-04 NOTE — Telephone Encounter (Signed)
See mychart message. Please get patient triaged and then scheduled here.  I think I can start the w/u here with OV, he may need an xray.  Thanks.

## 2019-01-05 DIAGNOSIS — M542 Cervicalgia: Secondary | ICD-10-CM

## 2019-01-05 DIAGNOSIS — R519 Headache, unspecified: Secondary | ICD-10-CM | POA: Insufficient documentation

## 2019-01-05 HISTORY — DX: Cervicalgia: M54.2

## 2019-01-05 NOTE — Assessment & Plan Note (Signed)
Discussed options.  No emergent findings.  Reasonable to check plain films today.  Reasonable to start prednisone with routine cautions.  He will update me as needed.  See notes on imaging.  He agrees with plan.

## 2019-01-07 ENCOUNTER — Other Ambulatory Visit: Payer: Self-pay | Admitting: Family Medicine

## 2019-01-07 DIAGNOSIS — M542 Cervicalgia: Secondary | ICD-10-CM

## 2019-01-12 DIAGNOSIS — L820 Inflamed seborrheic keratosis: Secondary | ICD-10-CM | POA: Diagnosis not present

## 2019-01-12 DIAGNOSIS — L728 Other follicular cysts of the skin and subcutaneous tissue: Secondary | ICD-10-CM | POA: Diagnosis not present

## 2019-01-20 DIAGNOSIS — M47812 Spondylosis without myelopathy or radiculopathy, cervical region: Secondary | ICD-10-CM | POA: Diagnosis not present

## 2019-02-11 DIAGNOSIS — Z20828 Contact with and (suspected) exposure to other viral communicable diseases: Secondary | ICD-10-CM | POA: Diagnosis not present

## 2019-02-22 DIAGNOSIS — R109 Unspecified abdominal pain: Secondary | ICD-10-CM | POA: Diagnosis not present

## 2019-02-22 DIAGNOSIS — N200 Calculus of kidney: Secondary | ICD-10-CM | POA: Diagnosis not present

## 2019-03-04 DIAGNOSIS — Z419 Encounter for procedure for purposes other than remedying health state, unspecified: Secondary | ICD-10-CM | POA: Diagnosis not present

## 2019-03-04 DIAGNOSIS — Z01812 Encounter for preprocedural laboratory examination: Secondary | ICD-10-CM | POA: Diagnosis not present

## 2019-03-04 DIAGNOSIS — N201 Calculus of ureter: Secondary | ICD-10-CM | POA: Diagnosis not present

## 2019-03-04 DIAGNOSIS — Z20828 Contact with and (suspected) exposure to other viral communicable diseases: Secondary | ICD-10-CM | POA: Diagnosis not present

## 2019-03-04 LAB — NOVEL CORONAVIRUS, NAA: SARS-CoV-2, NAA: NEGATIVE

## 2019-03-10 DIAGNOSIS — N201 Calculus of ureter: Secondary | ICD-10-CM | POA: Diagnosis not present

## 2019-03-10 DIAGNOSIS — N202 Calculus of kidney with calculus of ureter: Secondary | ICD-10-CM | POA: Diagnosis not present

## 2019-03-17 DIAGNOSIS — R8290 Unspecified abnormal findings in urine: Secondary | ICD-10-CM | POA: Diagnosis not present

## 2019-03-17 DIAGNOSIS — N201 Calculus of ureter: Secondary | ICD-10-CM | POA: Diagnosis not present

## 2019-03-17 DIAGNOSIS — N200 Calculus of kidney: Secondary | ICD-10-CM | POA: Diagnosis not present

## 2019-03-30 DIAGNOSIS — M47812 Spondylosis without myelopathy or radiculopathy, cervical region: Secondary | ICD-10-CM | POA: Diagnosis not present

## 2019-03-30 DIAGNOSIS — Z6831 Body mass index (BMI) 31.0-31.9, adult: Secondary | ICD-10-CM | POA: Diagnosis not present

## 2019-04-06 ENCOUNTER — Encounter: Payer: Self-pay | Admitting: Family Medicine

## 2019-04-06 DIAGNOSIS — M5412 Radiculopathy, cervical region: Secondary | ICD-10-CM | POA: Diagnosis not present

## 2019-04-06 DIAGNOSIS — M542 Cervicalgia: Secondary | ICD-10-CM | POA: Diagnosis not present

## 2019-04-11 DIAGNOSIS — N200 Calculus of kidney: Secondary | ICD-10-CM | POA: Diagnosis not present

## 2019-04-14 DIAGNOSIS — M5412 Radiculopathy, cervical region: Secondary | ICD-10-CM | POA: Diagnosis not present

## 2019-04-14 DIAGNOSIS — N200 Calculus of kidney: Secondary | ICD-10-CM | POA: Diagnosis not present

## 2019-06-03 HISTORY — PX: HERNIA REPAIR: SHX51

## 2019-07-07 DIAGNOSIS — R030 Elevated blood-pressure reading, without diagnosis of hypertension: Secondary | ICD-10-CM | POA: Diagnosis not present

## 2019-07-07 DIAGNOSIS — M47812 Spondylosis without myelopathy or radiculopathy, cervical region: Secondary | ICD-10-CM | POA: Diagnosis not present

## 2019-07-07 DIAGNOSIS — Z6831 Body mass index (BMI) 31.0-31.9, adult: Secondary | ICD-10-CM | POA: Diagnosis not present

## 2019-07-20 DIAGNOSIS — M25562 Pain in left knee: Secondary | ICD-10-CM | POA: Diagnosis not present

## 2019-08-26 DIAGNOSIS — M25562 Pain in left knee: Secondary | ICD-10-CM | POA: Diagnosis not present

## 2019-11-22 ENCOUNTER — Encounter: Payer: Self-pay | Admitting: Gastroenterology

## 2019-12-01 ENCOUNTER — Ambulatory Visit: Payer: Self-pay | Admitting: Allergy and Immunology

## 2019-12-02 ENCOUNTER — Encounter: Payer: Self-pay | Admitting: Family Medicine

## 2019-12-02 ENCOUNTER — Other Ambulatory Visit: Payer: Self-pay

## 2019-12-02 ENCOUNTER — Ambulatory Visit (INDEPENDENT_AMBULATORY_CARE_PROVIDER_SITE_OTHER): Payer: BC Managed Care – PPO | Admitting: Family Medicine

## 2019-12-02 VITALS — BP 138/80 | HR 87 | Temp 97.7°F | Ht 67.0 in | Wt 195.2 lb

## 2019-12-02 DIAGNOSIS — K439 Ventral hernia without obstruction or gangrene: Secondary | ICD-10-CM

## 2019-12-02 DIAGNOSIS — Z8349 Family history of other endocrine, nutritional and metabolic diseases: Secondary | ICD-10-CM | POA: Diagnosis not present

## 2019-12-02 DIAGNOSIS — Z7189 Other specified counseling: Secondary | ICD-10-CM

## 2019-12-02 DIAGNOSIS — Z125 Encounter for screening for malignant neoplasm of prostate: Secondary | ICD-10-CM

## 2019-12-02 DIAGNOSIS — Z0001 Encounter for general adult medical examination with abnormal findings: Secondary | ICD-10-CM

## 2019-12-02 DIAGNOSIS — M549 Dorsalgia, unspecified: Secondary | ICD-10-CM

## 2019-12-02 DIAGNOSIS — Z8249 Family history of ischemic heart disease and other diseases of the circulatory system: Secondary | ICD-10-CM

## 2019-12-02 LAB — LIPID PANEL
Cholesterol: 149 mg/dL (ref 0–200)
HDL: 32.3 mg/dL — ABNORMAL LOW (ref >39.00)
LDL Cholesterol: 89 mg/dL (ref 0–99)
NonHDL: 116.32
Total CHOL/HDL Ratio: 5
Triglycerides: 139 mg/dL (ref 0.0–149.0)
VLDL: 27.8 mg/dL (ref 0.0–40.0)

## 2019-12-02 LAB — CBC WITH DIFFERENTIAL/PLATELET
Basophils Absolute: 0 10*3/uL (ref 0.0–0.1)
Basophils Relative: 0.7 % (ref 0.0–3.0)
Eosinophils Absolute: 0.2 10*3/uL (ref 0.0–0.7)
Eosinophils Relative: 3.3 % (ref 0.0–5.0)
HCT: 50.5 % (ref 39.0–52.0)
Hemoglobin: 17.3 g/dL — ABNORMAL HIGH (ref 13.0–17.0)
Lymphocytes Relative: 21.8 % (ref 12.0–46.0)
Lymphs Abs: 1.2 10*3/uL (ref 0.7–4.0)
MCHC: 34.4 g/dL (ref 30.0–36.0)
MCV: 91.2 fl (ref 78.0–100.0)
Monocytes Absolute: 0.6 10*3/uL (ref 0.1–1.0)
Monocytes Relative: 10.9 % (ref 3.0–12.0)
Neutro Abs: 3.5 10*3/uL (ref 1.4–7.7)
Neutrophils Relative %: 63.3 % (ref 43.0–77.0)
Platelets: 196 10*3/uL (ref 150.0–400.0)
RBC: 5.53 Mil/uL (ref 4.22–5.81)
RDW: 12.8 % (ref 11.5–15.5)
WBC: 5.5 10*3/uL (ref 4.0–10.5)

## 2019-12-02 LAB — COMPREHENSIVE METABOLIC PANEL
ALT: 28 U/L (ref 0–53)
AST: 22 U/L (ref 0–37)
Albumin: 4.7 g/dL (ref 3.5–5.2)
Alkaline Phosphatase: 78 U/L (ref 39–117)
BUN: 10 mg/dL (ref 6–23)
CO2: 31 mEq/L (ref 19–32)
Calcium: 9.7 mg/dL (ref 8.4–10.5)
Chloride: 102 mEq/L (ref 96–112)
Creatinine, Ser: 1.11 mg/dL (ref 0.40–1.50)
GFR: 70.16 mL/min (ref >60.00)
Glucose, Bld: 104 mg/dL — ABNORMAL HIGH (ref 70–99)
Potassium: 4.4 mEq/L (ref 3.5–5.1)
Sodium: 139 mEq/L (ref 135–145)
Total Bilirubin: 0.7 mg/dL (ref 0.2–1.2)
Total Protein: 7.1 g/dL (ref 6.0–8.3)

## 2019-12-02 LAB — TSH: TSH: 3.22 u[IU]/mL (ref 0.35–4.50)

## 2019-12-02 LAB — PSA: PSA: 1.7 ng/mL (ref 0.10–4.00)

## 2019-12-02 MED ORDER — ALPRAZOLAM 0.5 MG PO TABS: ORAL_TABLET | ORAL | 0 refills | Status: DC

## 2019-12-02 MED ORDER — PREDNISONE 20 MG PO TABS: ORAL_TABLET | ORAL | 0 refills | Status: DC

## 2019-12-02 MED ORDER — METHOCARBAMOL 500 MG PO TABS: 500.0000 mg | ORAL_TABLET | Freq: Four times a day (QID) | ORAL | 1 refills | Status: DC

## 2019-12-02 NOTE — Patient Instructions (Addendum)
Check with your insurance to see if they will cover the shingrix shot when you turn 49.   I'll await the notes from GI.   We'll call about seeing cardiology.  EKG today.  Go to the lab on the way out.   If you have mychart we'll likely use that to update you.    Prednisone with food.  Keep stretching. Robaxin if needed.  Take care.  Glad to see you.

## 2019-12-02 NOTE — Progress Notes (Signed)
This visit occurred during the SARS-CoV-2 public health emergency.  Safety protocols were in place, including screening questions prior to the visit, additional usage of staff PPE, and extensive cleaning of exam room while observing appropriate contact time as indicated for disinfecting solutions.  CPE- See plan.  Routine anticipatory guidance given to patient.  See health maintenance.  The possibility exists that previously documented standard health maintenance information may have been brought forward from a previous encounter into this note.  If needed, that same information has been updated to reflect the current situation based on today's encounter.    Tetanus 2013 Flu yearly PNA and shingles not due.  covid 2021 He has GI eval pending and can talk with GI about possible colonoscopy.   Prostate cancer screening and PSA options (with potential risks and benefits of testing vs not testing) were discussed along with recent recs/guidelines.  He opted for testing PSA at this point. Living will d/w pt.  Bethanne Ginger designated if patient were incapacitated.   Diet and exercise d/w pt.  Encouraged both.   HIV and HCV prev done at [facility] ~1991.    Numbness superior to umbilicus with a bulge locally noted.  Sore locally.    Mult family members with CAD.  His father recently died, condolences offered.  We talked about checking labs and getting EKG today, then referral to cardiology.   He doesn't have consistent exertional chest pain unless he gets overheated.   Mult family members with thyroid disease.  TSH pending.    Back pain with relief with meloxicam and robaxin.  R lower back pain with radiation down the R leg.  No weakness.  Sx started recently.   PMH and SH reviewed  Meds, vitals, and allergies reviewed.   ROS: Per HPI.  Unless specifically indicated otherwise in HPI, the patient denies:  General: fever. Eyes: acute vision changes ENT: sore throat Cardiovascular: chest  pain Respiratory: SOB GI: vomiting GU: dysuria Musculoskeletal: acute back pain Derm: acute rash Neuro: acute motor dysfunction Psych: worsening mood Endocrine: polydipsia Heme: bleeding Allergy: hayfever  GEN: nad, alert and oriented HEENT: ncat NECK: supple w/o LA CV: rrr. PULM: ctab, no inc wob ABD: soft, +bs, likely hernia superior to umbilicus.   EXT: no edema SKIN: no acute rash R SLR pos and R lower back ttp.  No cva pain.  S/S wnl grossly wnl BLE.

## 2019-12-05 DIAGNOSIS — M549 Dorsalgia, unspecified: Secondary | ICD-10-CM

## 2019-12-05 DIAGNOSIS — Z8249 Family history of ischemic heart disease and other diseases of the circulatory system: Secondary | ICD-10-CM

## 2019-12-05 DIAGNOSIS — Z8349 Family history of other endocrine, nutritional and metabolic diseases: Secondary | ICD-10-CM

## 2019-12-05 DIAGNOSIS — K439 Ventral hernia without obstruction or gangrene: Secondary | ICD-10-CM

## 2019-12-05 HISTORY — DX: Ventral hernia without obstruction or gangrene: K43.9

## 2019-12-05 HISTORY — DX: Family history of other endocrine, nutritional and metabolic diseases: Z83.49

## 2019-12-05 HISTORY — DX: Family history of ischemic heart disease and other diseases of the circulatory system: Z82.49

## 2019-12-05 HISTORY — DX: Dorsalgia, unspecified: M54.9

## 2019-12-05 NOTE — Assessment & Plan Note (Signed)
Mult family members with thyroid disease.  TSH pending.   See notes on labs.

## 2019-12-05 NOTE — Assessment & Plan Note (Signed)
Soft, no emergent symptoms.  Routine cautions given to patient.  Defer general surgery referral until after he can see cardiology.  He agrees.

## 2019-12-05 NOTE — Assessment & Plan Note (Signed)
Back pain with relief with meloxicam and robaxin.  Right straight leg raise positive.  Still okay for outpatient follow-up.  Discussed stretching.  Stop meloxicam and other NSAIDs and change to prednisone.  Continue Robaxin as needed for now.  See after visit summary.  He agrees with plan.

## 2019-12-05 NOTE — Assessment & Plan Note (Signed)
Tetanus 2013 Flu yearly PNA and shingles not due.  covid 2021 He has GI eval pending and can talk with GI about possible colonoscopy.   Prostate cancer screening and PSA options (with potential risks and benefits of testing vs not testing) were discussed along with recent recs/guidelines.  He opted for testing PSA at this point. Living will d/w pt.  Bethanne Ginger designated if patient were incapacitated.   Diet and exercise d/w pt.  Encouraged both.   HIV and HCV prev done at [facility] ~1991.

## 2019-12-05 NOTE — Assessment & Plan Note (Signed)
Initial EKG today had arm lead reversal.  This was corrected and repeat EKG was sinus, no acute changes, within normal limits.  Discussed options.  Given his family history, refer to cardiology.  He agrees.

## 2019-12-05 NOTE — Assessment & Plan Note (Signed)
Living will d/w pt.  Bethanne Ginger designated if patient were incapacitated.

## 2019-12-08 DIAGNOSIS — M5416 Radiculopathy, lumbar region: Secondary | ICD-10-CM | POA: Diagnosis not present

## 2019-12-08 DIAGNOSIS — M533 Sacrococcygeal disorders, not elsewhere classified: Secondary | ICD-10-CM | POA: Diagnosis not present

## 2019-12-08 DIAGNOSIS — G8929 Other chronic pain: Secondary | ICD-10-CM | POA: Diagnosis not present

## 2019-12-13 DIAGNOSIS — Z8249 Family history of ischemic heart disease and other diseases of the circulatory system: Secondary | ICD-10-CM | POA: Diagnosis not present

## 2019-12-13 DIAGNOSIS — R030 Elevated blood-pressure reading, without diagnosis of hypertension: Secondary | ICD-10-CM | POA: Diagnosis not present

## 2019-12-14 ENCOUNTER — Encounter: Payer: Self-pay | Admitting: Family Medicine

## 2019-12-14 DIAGNOSIS — Z136 Encounter for screening for cardiovascular disorders: Secondary | ICD-10-CM | POA: Diagnosis not present

## 2019-12-14 DIAGNOSIS — M9902 Segmental and somatic dysfunction of thoracic region: Secondary | ICD-10-CM | POA: Diagnosis not present

## 2019-12-14 DIAGNOSIS — Z8249 Family history of ischemic heart disease and other diseases of the circulatory system: Secondary | ICD-10-CM | POA: Diagnosis not present

## 2019-12-14 DIAGNOSIS — M9903 Segmental and somatic dysfunction of lumbar region: Secondary | ICD-10-CM | POA: Diagnosis not present

## 2019-12-14 DIAGNOSIS — M47812 Spondylosis without myelopathy or radiculopathy, cervical region: Secondary | ICD-10-CM | POA: Diagnosis not present

## 2019-12-14 DIAGNOSIS — M9901 Segmental and somatic dysfunction of cervical region: Secondary | ICD-10-CM | POA: Diagnosis not present

## 2019-12-15 ENCOUNTER — Other Ambulatory Visit: Payer: Self-pay | Admitting: Family Medicine

## 2019-12-15 DIAGNOSIS — M5416 Radiculopathy, lumbar region: Secondary | ICD-10-CM | POA: Diagnosis not present

## 2019-12-15 MED ORDER — MELOXICAM 15 MG PO TABS
15.0000 mg | ORAL_TABLET | Freq: Every day | ORAL | 2 refills | Status: DC
Start: 2019-12-15 — End: 2020-03-05

## 2019-12-19 DIAGNOSIS — M9903 Segmental and somatic dysfunction of lumbar region: Secondary | ICD-10-CM | POA: Diagnosis not present

## 2019-12-19 DIAGNOSIS — M9902 Segmental and somatic dysfunction of thoracic region: Secondary | ICD-10-CM | POA: Diagnosis not present

## 2019-12-19 DIAGNOSIS — M9901 Segmental and somatic dysfunction of cervical region: Secondary | ICD-10-CM | POA: Diagnosis not present

## 2019-12-19 DIAGNOSIS — M47812 Spondylosis without myelopathy or radiculopathy, cervical region: Secondary | ICD-10-CM | POA: Diagnosis not present

## 2019-12-20 DIAGNOSIS — M5416 Radiculopathy, lumbar region: Secondary | ICD-10-CM | POA: Diagnosis not present

## 2019-12-20 DIAGNOSIS — M47812 Spondylosis without myelopathy or radiculopathy, cervical region: Secondary | ICD-10-CM | POA: Diagnosis not present

## 2019-12-20 DIAGNOSIS — S3992XA Unspecified injury of lower back, initial encounter: Secondary | ICD-10-CM | POA: Diagnosis not present

## 2019-12-20 DIAGNOSIS — M9902 Segmental and somatic dysfunction of thoracic region: Secondary | ICD-10-CM | POA: Diagnosis not present

## 2019-12-20 DIAGNOSIS — M9903 Segmental and somatic dysfunction of lumbar region: Secondary | ICD-10-CM | POA: Diagnosis not present

## 2019-12-20 DIAGNOSIS — M9901 Segmental and somatic dysfunction of cervical region: Secondary | ICD-10-CM | POA: Diagnosis not present

## 2019-12-21 DIAGNOSIS — M9902 Segmental and somatic dysfunction of thoracic region: Secondary | ICD-10-CM | POA: Diagnosis not present

## 2019-12-21 DIAGNOSIS — M47812 Spondylosis without myelopathy or radiculopathy, cervical region: Secondary | ICD-10-CM | POA: Diagnosis not present

## 2019-12-21 DIAGNOSIS — M5416 Radiculopathy, lumbar region: Secondary | ICD-10-CM | POA: Diagnosis not present

## 2019-12-21 DIAGNOSIS — M9901 Segmental and somatic dysfunction of cervical region: Secondary | ICD-10-CM | POA: Diagnosis not present

## 2019-12-21 DIAGNOSIS — M9903 Segmental and somatic dysfunction of lumbar region: Secondary | ICD-10-CM | POA: Diagnosis not present

## 2019-12-23 ENCOUNTER — Other Ambulatory Visit: Payer: Self-pay | Admitting: Family Medicine

## 2019-12-23 DIAGNOSIS — K439 Ventral hernia without obstruction or gangrene: Secondary | ICD-10-CM

## 2019-12-26 DIAGNOSIS — M9901 Segmental and somatic dysfunction of cervical region: Secondary | ICD-10-CM | POA: Diagnosis not present

## 2019-12-26 DIAGNOSIS — M47812 Spondylosis without myelopathy or radiculopathy, cervical region: Secondary | ICD-10-CM | POA: Diagnosis not present

## 2019-12-26 DIAGNOSIS — M9902 Segmental and somatic dysfunction of thoracic region: Secondary | ICD-10-CM | POA: Diagnosis not present

## 2019-12-26 DIAGNOSIS — M9903 Segmental and somatic dysfunction of lumbar region: Secondary | ICD-10-CM | POA: Diagnosis not present

## 2019-12-28 ENCOUNTER — Ambulatory Visit: Payer: BC Managed Care – PPO | Admitting: Cardiology

## 2019-12-28 DIAGNOSIS — M47812 Spondylosis without myelopathy or radiculopathy, cervical region: Secondary | ICD-10-CM | POA: Diagnosis not present

## 2019-12-28 DIAGNOSIS — M9901 Segmental and somatic dysfunction of cervical region: Secondary | ICD-10-CM | POA: Diagnosis not present

## 2019-12-28 DIAGNOSIS — M9902 Segmental and somatic dysfunction of thoracic region: Secondary | ICD-10-CM | POA: Diagnosis not present

## 2019-12-28 DIAGNOSIS — M9903 Segmental and somatic dysfunction of lumbar region: Secondary | ICD-10-CM | POA: Diagnosis not present

## 2019-12-30 DIAGNOSIS — M545 Low back pain: Secondary | ICD-10-CM | POA: Diagnosis not present

## 2019-12-30 DIAGNOSIS — M542 Cervicalgia: Secondary | ICD-10-CM | POA: Diagnosis not present

## 2020-01-02 DIAGNOSIS — M47812 Spondylosis without myelopathy or radiculopathy, cervical region: Secondary | ICD-10-CM | POA: Diagnosis not present

## 2020-01-02 DIAGNOSIS — M9903 Segmental and somatic dysfunction of lumbar region: Secondary | ICD-10-CM | POA: Diagnosis not present

## 2020-01-02 DIAGNOSIS — M9902 Segmental and somatic dysfunction of thoracic region: Secondary | ICD-10-CM | POA: Diagnosis not present

## 2020-01-02 DIAGNOSIS — M9901 Segmental and somatic dysfunction of cervical region: Secondary | ICD-10-CM | POA: Diagnosis not present

## 2020-01-03 DIAGNOSIS — M9902 Segmental and somatic dysfunction of thoracic region: Secondary | ICD-10-CM | POA: Diagnosis not present

## 2020-01-03 DIAGNOSIS — M9903 Segmental and somatic dysfunction of lumbar region: Secondary | ICD-10-CM | POA: Diagnosis not present

## 2020-01-03 DIAGNOSIS — M461 Sacroiliitis, not elsewhere classified: Secondary | ICD-10-CM | POA: Diagnosis not present

## 2020-01-03 DIAGNOSIS — M9901 Segmental and somatic dysfunction of cervical region: Secondary | ICD-10-CM | POA: Diagnosis not present

## 2020-01-03 DIAGNOSIS — M47812 Spondylosis without myelopathy or radiculopathy, cervical region: Secondary | ICD-10-CM | POA: Diagnosis not present

## 2020-01-04 DIAGNOSIS — M25571 Pain in right ankle and joints of right foot: Secondary | ICD-10-CM | POA: Diagnosis not present

## 2020-01-09 DIAGNOSIS — M47812 Spondylosis without myelopathy or radiculopathy, cervical region: Secondary | ICD-10-CM | POA: Diagnosis not present

## 2020-01-09 DIAGNOSIS — M9902 Segmental and somatic dysfunction of thoracic region: Secondary | ICD-10-CM | POA: Diagnosis not present

## 2020-01-09 DIAGNOSIS — M9901 Segmental and somatic dysfunction of cervical region: Secondary | ICD-10-CM | POA: Diagnosis not present

## 2020-01-09 DIAGNOSIS — M9903 Segmental and somatic dysfunction of lumbar region: Secondary | ICD-10-CM | POA: Diagnosis not present

## 2020-01-10 DIAGNOSIS — M9902 Segmental and somatic dysfunction of thoracic region: Secondary | ICD-10-CM | POA: Diagnosis not present

## 2020-01-10 DIAGNOSIS — M25371 Other instability, right ankle: Secondary | ICD-10-CM | POA: Diagnosis not present

## 2020-01-10 DIAGNOSIS — R269 Unspecified abnormalities of gait and mobility: Secondary | ICD-10-CM | POA: Diagnosis not present

## 2020-01-10 DIAGNOSIS — M9903 Segmental and somatic dysfunction of lumbar region: Secondary | ICD-10-CM | POA: Diagnosis not present

## 2020-01-10 DIAGNOSIS — M9901 Segmental and somatic dysfunction of cervical region: Secondary | ICD-10-CM | POA: Diagnosis not present

## 2020-01-10 DIAGNOSIS — M47812 Spondylosis without myelopathy or radiculopathy, cervical region: Secondary | ICD-10-CM | POA: Diagnosis not present

## 2020-01-10 DIAGNOSIS — M6281 Muscle weakness (generalized): Secondary | ICD-10-CM | POA: Diagnosis not present

## 2020-01-11 DIAGNOSIS — R269 Unspecified abnormalities of gait and mobility: Secondary | ICD-10-CM | POA: Diagnosis not present

## 2020-01-11 DIAGNOSIS — M25371 Other instability, right ankle: Secondary | ICD-10-CM | POA: Diagnosis not present

## 2020-01-11 DIAGNOSIS — M6281 Muscle weakness (generalized): Secondary | ICD-10-CM | POA: Diagnosis not present

## 2020-01-13 ENCOUNTER — Ambulatory Visit: Payer: BC Managed Care – PPO | Admitting: Gastroenterology

## 2020-01-23 DIAGNOSIS — M25371 Other instability, right ankle: Secondary | ICD-10-CM | POA: Diagnosis not present

## 2020-01-23 DIAGNOSIS — M9901 Segmental and somatic dysfunction of cervical region: Secondary | ICD-10-CM | POA: Diagnosis not present

## 2020-01-23 DIAGNOSIS — R269 Unspecified abnormalities of gait and mobility: Secondary | ICD-10-CM | POA: Diagnosis not present

## 2020-01-23 DIAGNOSIS — M47812 Spondylosis without myelopathy or radiculopathy, cervical region: Secondary | ICD-10-CM | POA: Diagnosis not present

## 2020-01-23 DIAGNOSIS — M9903 Segmental and somatic dysfunction of lumbar region: Secondary | ICD-10-CM | POA: Diagnosis not present

## 2020-01-23 DIAGNOSIS — M6281 Muscle weakness (generalized): Secondary | ICD-10-CM | POA: Diagnosis not present

## 2020-01-23 DIAGNOSIS — M9902 Segmental and somatic dysfunction of thoracic region: Secondary | ICD-10-CM | POA: Diagnosis not present

## 2020-01-24 ENCOUNTER — Encounter: Payer: Self-pay | Admitting: Gastroenterology

## 2020-01-24 ENCOUNTER — Ambulatory Visit: Payer: BC Managed Care – PPO | Admitting: Gastroenterology

## 2020-01-24 VITALS — BP 146/76 | HR 92 | Ht 67.0 in | Wt 199.5 lb

## 2020-01-24 DIAGNOSIS — Z1211 Encounter for screening for malignant neoplasm of colon: Secondary | ICD-10-CM | POA: Diagnosis not present

## 2020-01-24 DIAGNOSIS — R109 Unspecified abdominal pain: Secondary | ICD-10-CM

## 2020-01-24 DIAGNOSIS — K921 Melena: Secondary | ICD-10-CM

## 2020-01-24 DIAGNOSIS — Z1212 Encounter for screening for malignant neoplasm of rectum: Secondary | ICD-10-CM

## 2020-01-24 DIAGNOSIS — R195 Other fecal abnormalities: Secondary | ICD-10-CM

## 2020-01-24 DIAGNOSIS — R197 Diarrhea, unspecified: Secondary | ICD-10-CM

## 2020-01-24 DIAGNOSIS — R1084 Generalized abdominal pain: Secondary | ICD-10-CM | POA: Diagnosis not present

## 2020-01-24 MED ORDER — CLENPIQ 10-3.5-12 MG-GM -GM/160ML PO SOLN
1.0000 | ORAL | 0 refills | Status: DC
Start: 2020-01-24 — End: 2020-02-08

## 2020-01-24 NOTE — Progress Notes (Signed)
Chief Complaint: Colon cancer screening, chronic abdominal pain, chronic diarrhea, hematochezia  Referring Provider:     Tonia Ghent, MD   HPI:     [name] is a 50 y.o. male with a history of nephrolithiasis s/p lithotripsy 2009, cholecystectomy 2009, appendectomy 2012, migraines, hernia repair (2011, 2013), referred to the [facility] for evaluation of CRC screening and evaluation of chronic GI symptomatology.  He reports a longstanding history of fluctuating bowel movements, primarily nonbloody diarrhea, as long as he can remember.  Does have significant postprandial urgency.  Does also have urgency to have BM with activity and abdominal palpation.  Has 4-5 BMs/day.  No nocturnal symptoms.  Stool consistency can be variable.  Intermittent episodes of BRBPR, particularly worse with straining to have BM.  He denies steatorrhea.  He uses Pepto-Bismol both as a prophylactic agent and for symptom relief for essentially as long as he can remember.  Occasional episodes of constipation and does have black stools, which she attributes to [facility] use.  Does have associated abdominal cramping abdominal discomfort, which tends to improve with BM.  Otherwise denies nausea, vomiting, fever, chills, weight loss, early satiety.  Family history notable for mother with GYN cancer-father with CAD, but no known family history of CRC, IBD, GI malignancy.  History of chronic back pain, treated with meloxicam and Robaxin.  Has had heavy NSAID use in the past as well.  More recently with bilateral LE numbness/weakness.  Was trialed on a course of prednisone recently.  Was also recently referred to [facility] for evaluation of abdominal hernia repair.  Endoscopic history: -Colonoscopy (2008, [facility]): 3 mm polyp at hepatic flexure removed with cold forceps (path not available).  CT 02/22/2019: Normal GI tract, normal liver and pancreas.   CBC Latest Ref Rng &  Units 12/02/2019 05/10/2018 07/01/2017  WBC 4.0 - 10.5 K/uL 5.5 6.2 8.2  Hemoglobin 13.0 - 17.0 g/dL 17.3(H) 16.2 16.4  Hematocrit 39 - 52 % 50.5 47.0 46.5  Platelets 150 - 400 K/uL 196.0 230.0 230.0   CMP Latest Ref Rng & Units 12/02/2019 05/10/2018 06/10/2017  Glucose 70 - 99 mg/dL 104(H) 73 94  BUN 6 - 23 mg/dL 10 11 12   Creatinine 0.40 - 1.50 mg/dL 1.11 1.08 1.07  Sodium 135 - 145 mEq/L 139 143 143  Potassium 3.5 - 5.1 mEq/L 4.4 3.5 3.5  Chloride 96 - 112 mEq/L 102 104 104  CO2 19 - 32 mEq/L 31 31 31   Calcium 8.4 - 10.5 mg/dL 9.7 9.4 9.3  Total Protein 6.0 - 8.3 g/dL 7.1 7.1 -  Total Bilirubin 0.2 - 1.2 mg/dL 0.7 1.0 -  Alkaline Phos 39 - 117 U/L 78 70 -  AST 0 - 37 U/L 22 30 -  ALT 0 - 53 U/L 28 30 -      Past Medical History:  Diagnosis Date  . Allergy   . Anxiety   . Chickenpox   . Complication of anesthesia    hard to wake up-does not take much  . Dyskinesia   . Headache(784.0)    Migraine  . History of cholecystectomy   . 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 Medial meniscus tear    left  . Nephrolithiasis   . Osgood-Schlatter's disease, right   . Pancreatitis    chronic  . Tear of meniscus, medial    left knee  .  Ulnar neuropathy    mild symptoms      Past Surgical History:  Procedure Laterality Date  . APPENDECTOMY  2012   lap append  . CHOLECYSTECTOMY  2009  . CLOSED REDUCTION CLAVICLE FRACTURE  1993   left  . COLONOSCOPY  2008  . ESOPHAGOGASTRODUODENOSCOPY     61[place]   . GALLBLADDER SURGERY  2008  . HERNIA REPAIR  2011   repair from gallbladder surgery  . HERNIA REPAIR  0539   umbilical  . LITHOTRIPSY  2000  . mesh surgery  2011   abdominal  . NASAL SEPTOPLASTY W/ TURBINOPLASTY Bilateral 01/25/2014   Procedure: NASAL SEPTOPLASTY/BILATERAL INFERIOR TURBINATE REDUCTION;  Surgeon: Jerrell Belfast, MD;  Location: [facility];  Service: ENT;  Laterality: Bilateral;  . WISDOM TOOTH EXTRACTION     Family  History  Problem Relation Age of Onset  . Cancer Mother        gyn cancer  . Liver disease Mother        NASH  . Esophageal varices Mother        due to spleen and liver fuctions not working properly   . Heart disease Father   . Colon cancer Neg Hx   . Prostate cancer Neg Hx   . Esophageal cancer Neg Hx    Social History   Tobacco Use  . Smoking status: Never Smoker  . Smokeless tobacco: Never Used  Vaping Use  . Vaping Use: Never used  Substance Use Topics  . Alcohol use: Yes    Alcohol/week: 0.0 standard drinks    Comment: 1 glass of wine/month  . Drug use: No   Current Outpatient Medications  Medication Sig Dispense Refill  . ALPRAZolam (XANAX) 0.5 MG tablet TAKE 1/4-1/2 TABLETS BY MOUTH AS NEEDED AS DIRECTED.  Max 3 doses per day 30 tablet 0  . ibuprofen (ADVIL) 200 MG tablet Take 200-600 mg by mouth as needed.    Marland Kitchen levocetirizine (XYZAL) 5 MG tablet Take 1 tablet (5 mg total) by mouth every evening. (Patient taking differently: Take 5 mg by mouth as needed. ) 30 tablet 11  . meloxicam (MOBIC) 15 MG tablet Take 1 tablet (15 mg total) by mouth daily. With food.  Do not take with prednisone, Aleve, ibuprofen. (Patient taking differently: Take 15 mg by mouth as needed. With food.  Do not take with prednisone, Aleve, ibuprofen.) 30 tablet 2  . clotrimazole-betamethasone (LOTRISONE) cream APPLY TO AFFECTED AREA TWICE A DAY (Patient not taking: Reported on 01/24/2020) 30 g 0  . methocarbamol (ROBAXIN) 500 MG tablet Take 1 tablet (500 mg total) by mouth 4 (four) times daily. (Patient not taking: Reported on 01/24/2020) 40 tablet 1  . mupirocin ointment (BACTROBAN) 2 % Place 1 application into the nose 2 (two) times daily. (Patient not taking: Reported on 01/24/2020) 15 g 0   No current facility-administered medications for this visit.   Allergies  Allergen Reactions  . Minocycline Hives    Hives- can tolerate doxycycline.   . Morphine Nausea And Vomiting    REACTION: vomiting  .  Pseudoephedrine Other (See Comments)    Urinary retention  . Topiramate Other (See Comments)    REACTION: intolerant of dose of 75mg  or higher, tolerates 50mg  dose.  . Cephalexin     REACTION: rash  . Citalopram Hydrobromide     Sedation, lack of motivation  . Cyclobenzaprine Hcl     REACTION: sedation  . Levaquin [Levofloxacin] Other (See Comments)  Diffuse aches while on med.   . Sulfonamide Derivatives     REACTION: rash  . Sumatriptan   . Codeine Nausea Only    REACTION: nausea. Can tolerate hydrocodone  . Venlafaxine Nausea Only    Nausea, intolerant     Review of Systems: All systems reviewed and negative except where noted in HPI.     Physical Exam:    Wt Readings from Last 3 Encounters:  01/24/20 199 lb 8 oz (90.5 kg)  12/02/19 195 lb 4 oz (88.6 kg)  01/04/19 202 lb 8 oz (91.9 kg)    BP (!) 146/76   Pulse 92   Ht 5\' 7"  (1.702 m)   Wt 199 lb 8 oz (90.5 kg)   BMI 31.25 kg/m  Constitutional:  Pleasant, in no acute distress. Psychiatric: Normal mood and affect. Behavior is normal. EENT: Pupils normal.  Conjunctivae are normal. No scleral icterus. Neck supple. No cervical LAD. Cardiovascular: Normal rate, regular rhythm. No edema Pulmonary/chest: Effort normal and breath sounds normal. No wheezing, rales or rhonchi. Abdominal: Soft, nondistended, nontender. Bowel sounds active throughout. There are no masses palpable. No hepatomegaly. Neurological: Alert and oriented to person place and time. Skin: Skin is warm and dry. No rashes noted. Rectal: Exam deferred to time of colonoscopy.    ASSESSMENT AND PLAN;   1) Colon cancer screening: -Due for age-appropriate, average risk CRC screening -Will schedule colonoscopy  2) Change in bowel habits 3) Diarrhea 4) Hematochezia 5) Dark stools 6) Abdominal pain 7) Abdominal cramping -Highest clinical suspicion for IBS, but discussed DDx to include IBD, microscopic colitis, EPI, etc. -Plan to evaluate for  mucosal/luminal pathology at time of colonoscopy as above, with plan for directed biopsies -EGD to assess for duodenal pathology with duodenal biopsies -Celiac panel, fecal elastase -Given very long duration of symptoms, less suspicious for infection -Evaluate for anorectal source of bleeding (i.e. hemorrhoids) -Okay to use Imodium as needed.  Can also take prophylactically prior to travel, stressful situation, etc. that are known to be provoking agents for him -Pending work-up as above, can also benefit from increase dietary fiber, fiber supplement, etc. may also need to discuss neuromodulation  The indications, risks, and benefits of EGD and colonoscopy were explained to the patient in detail. Risks include but are not limited to bleeding, perforation, adverse reaction to medications, and cardiopulmonary compromise. Sequelae include but are not limited to the possibility of surgery, hositalization, and mortality. The patient verbalized understanding and wished to proceed. All questions answered, referred to scheduler and bowel prep ordered. Further recommendations pending results of the exam.    [name], DO, FACG  01/24/2020, 10:40 AM   Tonia Ghent, MD

## 2020-01-24 NOTE — Patient Instructions (Addendum)
If you are age 50 or   older, your body mass index should be between 23-30. Your Body mass index is 31.25 kg/m. If this is out of the aforementioned range listed, please consider follow up with your Primary Care Provider.  If you are age 68 or younger, your body mass index should be between 19-25. Your Body mass index is 31.25 kg/m. If this is out of the aformentioned range listed, please consider follow up with your Primary Care Provider.   You have been scheduled for an endoscopy and colonoscopy. Please follow the written instructions given to you at your visit today. Please pick up your prep supplies at the pharmacy within the next 1-3 days. If you use inhalers (even only as needed), please bring them with you on the day of your procedure.  We have sent the following medications to your pharmacy for you to pick up at your convenience: [place] provider has requested that you go to the basement level for lab work at [place], [place], [place]  today. Press "B" on the elevator. The lab is located at the first door on the left as you exit the elevator.   It was a pleasure to see you today!  Vito Cirigliano, D.O.

## 2020-01-25 ENCOUNTER — Other Ambulatory Visit (INDEPENDENT_AMBULATORY_CARE_PROVIDER_SITE_OTHER): Payer: BC Managed Care – PPO

## 2020-01-25 ENCOUNTER — Encounter: Payer: Self-pay | Admitting: Gastroenterology

## 2020-01-25 DIAGNOSIS — Z1212 Encounter for screening for malignant neoplasm of rectum: Secondary | ICD-10-CM

## 2020-01-25 DIAGNOSIS — K921 Melena: Secondary | ICD-10-CM | POA: Diagnosis not present

## 2020-01-25 DIAGNOSIS — Z1211 Encounter for screening for malignant neoplasm of colon: Secondary | ICD-10-CM

## 2020-01-25 DIAGNOSIS — R269 Unspecified abnormalities of gait and mobility: Secondary | ICD-10-CM | POA: Diagnosis not present

## 2020-01-25 DIAGNOSIS — R197 Diarrhea, unspecified: Secondary | ICD-10-CM

## 2020-01-25 DIAGNOSIS — M6281 Muscle weakness (generalized): Secondary | ICD-10-CM | POA: Diagnosis not present

## 2020-01-25 DIAGNOSIS — R1084 Generalized abdominal pain: Secondary | ICD-10-CM

## 2020-01-25 DIAGNOSIS — M25371 Other instability, right ankle: Secondary | ICD-10-CM | POA: Diagnosis not present

## 2020-01-25 LAB — IGA: IgA: 338 mg/dL (ref 68–378)

## 2020-01-26 LAB — TISSUE TRANSGLUTAMINASE, IGA: (tTG) Ab, IgA: 2 U/mL

## 2020-01-30 DIAGNOSIS — M7671 Peroneal tendinitis, right leg: Secondary | ICD-10-CM | POA: Diagnosis not present

## 2020-01-31 LAB — PANCREATIC ELASTASE, FECAL: Pancreatic Elastase-1, Stool: >500 mcg/g

## 2020-02-01 ENCOUNTER — Telehealth: Payer: Self-pay | Admitting: *Deleted

## 2020-02-01 DIAGNOSIS — M25371 Other instability, right ankle: Secondary | ICD-10-CM | POA: Diagnosis not present

## 2020-02-01 DIAGNOSIS — M6281 Muscle weakness (generalized): Secondary | ICD-10-CM | POA: Diagnosis not present

## 2020-02-01 DIAGNOSIS — R269 Unspecified abnormalities of gait and mobility: Secondary | ICD-10-CM | POA: Diagnosis not present

## 2020-02-01 NOTE — Telephone Encounter (Signed)
Patient called stating that he scheduled an appointment on my chart with Dr. Damita Dunnings tomorrow at 2:00 pm and was advised to call triage to discuss. Patient stated that he has had a headache off and on for over a year. Patient stated the headache comes and goes. Patient stated the headache was bad yesterday but some better today. Patient denies, fever, cough, sore throat, SOB or any other covid symptoms with the exception of a headache that he has had for over a year. Patient stated that his blood pressure has been a little elevated at times with systolic 704/492. Patient advised to keep the appointment and ER precautions were given to patient and he verbalized understanding.

## 2020-02-01 NOTE — Telephone Encounter (Signed)
Agreed.  Thanks.  

## 2020-02-02 ENCOUNTER — Ambulatory Visit: Payer: BC Managed Care – PPO | Admitting: Family Medicine

## 2020-02-02 ENCOUNTER — Encounter: Payer: Self-pay | Admitting: Family Medicine

## 2020-02-02 ENCOUNTER — Other Ambulatory Visit: Payer: Self-pay

## 2020-02-02 VITALS — BP 122/72 | HR 94 | Temp 96.8°F | Ht 67.0 in | Wt 196.3 lb

## 2020-02-02 DIAGNOSIS — R519 Headache, unspecified: Secondary | ICD-10-CM

## 2020-02-02 DIAGNOSIS — Z8249 Family history of ischemic heart disease and other diseases of the circulatory system: Secondary | ICD-10-CM

## 2020-02-02 NOTE — Progress Notes (Signed)
This visit occurred during the SARS-CoV-2 public health emergency.  Safety protocols were in place, including screening questions prior to the visit, additional usage of staff PPE, and extensive cleaning of exam room while observing appropriate contact time as indicated for disinfecting solutions.  Headaches.  He changed from advil to meloxicam.  Robaxin minimally helped.  HA is R temple and frontal area.  He doesn't have occipital pain like he prev did.  No neck pain.  He has seen chiropractor in the meantime.  Neck ROM is better in the meantime.  No photophobia. He doesn't have skin sensitivity.  No vision changes.  No vision loss.  Using reading glasses.  Sx/pain worse in the last month.    Noted FH cardiac disease.  We talked about getting second opinion.  Referral placed.  Meds, vitals, and allergies reviewed.   ROS: Per HPI unless specifically indicated in ROS section   GEN: nad, alert and oriented HEENT: mucous membranes moist, ncat, TMJ not tender to palpation bilaterally. TM wnl B NECK: supple w/o LA CV: rrr PULM: ctab, no inc wob EXT: no edema SKIN: Well-perfused. CN 2-12 wnl B, S/S wnl x4

## 2020-02-02 NOTE — Patient Instructions (Signed)
Go to the lab on the way out.   If you have mychart we'll likely use that to update you.    Don't change your meds for now but let me see about options to replacement nsaids in the meantime.  Take care.  Glad to see you.

## 2020-02-03 LAB — SEDIMENTATION RATE: Sed Rate: 1 mm/hr (ref 0–20)

## 2020-02-03 LAB — C-REACTIVE PROTEIN: CRP: <1 mg/dL (ref 0.5–20.0)

## 2020-02-06 ENCOUNTER — Other Ambulatory Visit: Payer: Self-pay | Admitting: Family Medicine

## 2020-02-06 DIAGNOSIS — R519 Headache, unspecified: Secondary | ICD-10-CM | POA: Insufficient documentation

## 2020-02-06 HISTORY — DX: Headache, unspecified: R51.9

## 2020-02-06 MED ORDER — TIZANIDINE HCL 2 MG PO CAPS: 2.0000 mg | ORAL_CAPSULE | Freq: Three times a day (TID) | ORAL | 1 refills | Status: DC | PRN

## 2020-02-06 NOTE — Assessment & Plan Note (Signed)
Refer to cardiology.  He agrees.  No exertional chest pain.

## 2020-02-06 NOTE — Assessment & Plan Note (Signed)
Unclear source.  Discussed options Check ESR.  I want to consider replacement for nsaids, since he could be having rebound from that.  Normal neurologic exam and still okay for outpatient follow-up.  He agrees.  See notes on labs.

## 2020-02-07 ENCOUNTER — Encounter: Payer: Self-pay | Admitting: Family Medicine

## 2020-02-08 ENCOUNTER — Ambulatory Visit (AMBULATORY_SURGERY_CENTER): Payer: BC Managed Care – PPO | Admitting: Gastroenterology

## 2020-02-08 ENCOUNTER — Encounter: Payer: Self-pay | Admitting: Gastroenterology

## 2020-02-08 ENCOUNTER — Other Ambulatory Visit: Payer: Self-pay

## 2020-02-08 VITALS — BP 117/73 | HR 67 | Temp 98.6°F | Resp 12 | Ht 67.0 in | Wt 197.0 lb

## 2020-02-08 DIAGNOSIS — K317 Polyp of stomach and duodenum: Secondary | ICD-10-CM

## 2020-02-08 DIAGNOSIS — D123 Benign neoplasm of transverse colon: Secondary | ICD-10-CM | POA: Diagnosis not present

## 2020-02-08 DIAGNOSIS — K635 Polyp of colon: Secondary | ICD-10-CM | POA: Diagnosis not present

## 2020-02-08 DIAGNOSIS — K921 Melena: Secondary | ICD-10-CM | POA: Diagnosis not present

## 2020-02-08 DIAGNOSIS — K2970 Gastritis, unspecified, without bleeding: Secondary | ICD-10-CM

## 2020-02-08 DIAGNOSIS — K644 Residual hemorrhoidal skin tags: Secondary | ICD-10-CM | POA: Diagnosis not present

## 2020-02-08 DIAGNOSIS — K2980 Duodenitis without bleeding: Secondary | ICD-10-CM

## 2020-02-08 DIAGNOSIS — K2981 Duodenitis with bleeding: Secondary | ICD-10-CM | POA: Diagnosis not present

## 2020-02-08 DIAGNOSIS — R197 Diarrhea, unspecified: Secondary | ICD-10-CM | POA: Diagnosis not present

## 2020-02-08 DIAGNOSIS — K641 Second degree hemorrhoids: Secondary | ICD-10-CM

## 2020-02-08 DIAGNOSIS — D125 Benign neoplasm of sigmoid colon: Secondary | ICD-10-CM

## 2020-02-08 MED ORDER — SODIUM CHLORIDE 0.9 % IV SOLN: 500.0000 mL | Freq: Once | INTRAVENOUS | Status: DC

## 2020-02-08 MED ORDER — OMEPRAZOLE 20 MG PO CPDR: 20.0000 mg | DELAYED_RELEASE_CAPSULE | Freq: Two times a day (BID) | ORAL | 1 refills | Status: DC

## 2020-02-08 NOTE — Progress Notes (Signed)
pt tolerated well. VSS. awake and to recovery. Report given to RN. Bite block inserted and removed without trauma. 

## 2020-02-08 NOTE — Patient Instructions (Signed)
Please read handouts provided. Continue present medications. Await pathology results. Begin Prilosec ( omeprazole ) 20 mg twice daily then titrate to lowest effective dose, or can discontinue if symptoms well controlled. Consider a fiber supplement. Return to GI clinic as needed.      YOU HAD AN ENDOSCOPIC PROCEDURE TODAY AT [facility]:   Refer to the procedure report that was given to you for any specific questions about what was found during the examination.  If the procedure report does not answer your questions, please call your gastroenterologist to clarify.  If you requested that your care partner not be given the details of your procedure findings, then the procedure report has been included in a sealed envelope for you to review at your convenience later.  YOU SHOULD EXPECT: Some feelings of bloating in the abdomen. Passage of more gas than usual.  Walking can help get rid of the air that was put into your GI tract during the procedure and reduce the bloating. If you had a lower endoscopy (such as a colonoscopy or flexible sigmoidoscopy) you may notice spotting of blood in your stool or on the toilet paper. If you underwent a bowel prep for your procedure, you may not have a normal bowel movement for a few days.  Please Note:  You might notice some irritation and congestion in your nose or some drainage.  This is from the oxygen used during your procedure.  There is no need for concern and it should clear up in a day or so.  SYMPTOMS TO REPORT IMMEDIATELY:   Following lower endoscopy (colonoscopy or flexible sigmoidoscopy):  Excessive amounts of blood in the stool  Significant tenderness or worsening of abdominal pains  Swelling of the abdomen that is new, acute  Fever of 100F or higher   Following upper endoscopy (EGD)  Vomiting of blood or coffee ground material  New chest pain or pain under the shoulder blades  Painful or persistently difficult  swallowing  New shortness of breath  Fever of 100F or higher  Black, tarry-looking stools  For urgent or emergent issues, a gastroenterologist can be reached at any hour by calling [phone]. Do not use MyChart messaging for urgent concerns.    DIET:  We do recommend a small meal at first, but then you may proceed to your regular diet.  Drink plenty of fluids but you should avoid alcoholic beverages for 24 hours.  ACTIVITY:  You should plan to take it easy for the rest of today and you should NOT DRIVE or use heavy machinery until tomorrow (because of the sedation medicines used during the test).    FOLLOW UP: Our staff will call the number listed on your records 48-72 hours following your procedure to check on you and address any questions or concerns that you may have regarding the information given to you following your procedure. If we do not reach you, we will leave a message.  We will attempt to reach you two times.  During this call, we will ask if you have developed any symptoms of COVID 19. If you develop any symptoms (ie: fever, flu-like symptoms, shortness of breath, cough etc.) before then, please call [phone].  If you test positive for Covid 19 in the 2 weeks post procedure, please call and report this information to [place].    If any biopsies were taken you will be contacted by phone or by letter within the next 1-3 weeks.  Please call us at [phone] if you have not heard about the biopsies in 3 weeks.    SIGNATURES/CONFIDENTIALITY: You and/or your care partner have signed paperwork which will be entered into your electronic medical record.  These signatures attest to the fact that that the information above on your After Visit Summary has been reviewed and is understood.  Full responsibility of the confidentiality of this discharge information lies with you and/or your care-partner.

## 2020-02-08 NOTE — Op Note (Signed)
[facility] Patient Name: [name] Procedure Date: 02/08/2020 1:24 PM MRN: [id number] Endoscopist: Gerrit Heck , MD Age: 49 Referring MD:  Date of Birth: 10/30/1970 Gender: Male Account #: [id number] Procedure:                Colonoscopy Indications:              Screening for colorectal malignant neoplasm                           Incidental - Clinically significant diarrhea of                            unexplained origin with change in bowel habits                            along with intermittent hematochezia Medicines:                Monitored Anesthesia Care Procedure:                Pre-Anesthesia Assessment:                           - Prior to the procedure, a History and Physical                            was performed, and patient medications and                            allergies were reviewed. The patient's tolerance of                            previous anesthesia was also reviewed. The risks                            and benefits of the procedure and the sedation                            options and risks were discussed with the patient.                            All questions were answered, and informed consent                            was obtained. Prior Anticoagulants: The patient has                            taken no previous anticoagulant or antiplatelet                            agents. ASA Grade Assessment: II - A patient with                            mild systemic disease. After reviewing the risks  and benefits, the patient was deemed in                            satisfactory condition to undergo the procedure.                           After obtaining informed consent, the colonoscope                            was passed under direct vision. Throughout the                            procedure, the patient's blood pressure, pulse, and                            oxygen saturations were monitored  continuously. The                            Colonoscope was introduced through the anus and                            advanced to the the terminal ileum. The colonoscopy                            was performed without difficulty. The patient                            tolerated the procedure well. The quality of the                            bowel preparation was good. The terminal ileum,                            ileocecal valve, appendiceal orifice, and rectum                            were photographed. Scope In: 2:37:50 PM Scope Out: 2:55:08 PM Scope Withdrawal Time: 0 hours 13 minutes 31 seconds  Total Procedure Duration: 0 hours 17 minutes 18 seconds  Findings:                 Hemorrhoids were found on perianal exam.                           A 6 mm polyp was found in the transverse colon. The                            polyp was flat with adherent mucus cap. The polyp                            was removed with a cold snare. Resection and                            retrieval were complete. Estimated blood loss was  minimal.                           A 3 mm polyp was found in the sigmoid colon. The                            polyp was sessile. The polyp was removed with a                            cold snare. Resection and retrieval were complete.                            Estimated blood loss was minimal.                           The mucosa was otherwise normal throughout the                            remainder of the colon. Biopsies for histology were                            taken with a cold forceps from the right colon and                            left colon for evaluation of microscopic colitis.                            Estimated blood loss was minimal.                           Non-bleeding internal hemorrhoids were found during                            retroflexion. The hemorrhoids were small and Grade                             II (internal hemorrhoids that prolapse but reduce                            spontaneously).                           The terminal ileum appeared normal. Complications:            No immediate complications. Estimated Blood Loss:     Estimated blood loss was minimal. Impression:               - Hemorrhoids found on perianal exam.                           - One 6 mm polyp in the transverse colon, removed                            with a cold snare. Resected and retrieved.                           -  One 3 mm polyp in the sigmoid colon, removed with                            a cold snare. Resected and retrieved.                           - Normal mucosa in the entire examined colon.                            Biopsied.                           - Non-bleeding internal hemorrhoids.                           - The examined portion of the ileum was normal. Recommendation:           - Patient has a contact number available for                            emergencies. The signs and symptoms of potential                            delayed complications were discussed with the                            patient. Return to normal activities tomorrow.                            Written discharge instructions were provided to the                            patient.                           - Resume previous diet.                           - Continue present medications.                           - Use fiber, for example Citrucel, Fibercon, Konsyl                            or Metamucil.                           - Repeat colonoscopy for surveillance based on                            pathology results.                           - Return to GI office PRN. Gerrit Heck, MD 02/08/2020 3:08:44 PM

## 2020-02-08 NOTE — Op Note (Signed)
[name] Patient Name: [name] Procedure Date: 02/08/2020 2:14 PM MRN: [id number] Endoscopist: Gerrit Heck , MD Age: 49 Referring MD:  Date of Birth: September 09, 1970 Gender: Male Account #: [id number] Procedure:                Upper GI endoscopy Indications:              Generalized abdominal pain, Diarrhea Medicines:                Monitored Anesthesia Care Procedure:                Pre-Anesthesia Assessment:                           - Prior to the procedure, a History and Physical                            was performed, and patient medications and                            allergies were reviewed. The patient's tolerance of                            previous anesthesia was also reviewed. The risks                            and benefits of the procedure and the sedation                            options and risks were discussed with the patient.                            All questions were answered, and informed consent                            was obtained. Prior Anticoagulants: The patient has                            taken no previous anticoagulant or antiplatelet                            agents. ASA Grade Assessment: II - A patient with                            mild systemic disease. After reviewing the risks                            and benefits, the patient was deemed in                            satisfactory condition to undergo the procedure.                           After obtaining informed consent, the endoscope was  passed under direct vision. Throughout the                            procedure, the patient's blood pressure, pulse, and                            oxygen saturations were monitored continuously. The                            Endoscope was introduced through the mouth, and                            advanced to the second part of duodenum. The upper                            GI endoscopy was  accomplished without difficulty.                            The patient tolerated the procedure well. Scope In: Scope Out: Findings:                 The Z-line was mildly irregular, characterized by                            mildly edematous mucosa, and was found 35 cm from                            the incisors. Biopsies were taken with a cold                            forceps for histology. Estimated blood loss was                            minimal.                           The gastroesophageal flap valve was visualized                            endoscopically and classified as Hill Grade III                            (minimal fold, loose to endoscope, hiatal hernia                            likely).                           Localized mild inflammation characterized by                            erythema was found in the gastric antrum and in the                            prepyloric region of the stomach. Biopsies  were                            taken with a cold forceps for Helicobacter pylori                            testing. Estimated blood loss was minimal.                           A few small sessile polyps with no bleeding and no                            stigmata of recent bleeding were found in the                            gastric fundus and in the gastric body. Several of                            these polyps were removed with a cold biopsy                            forceps for histologic representative evaluation.                            Resection and retrieval were complete. Estimated                            blood loss was minimal.                           The duodenal bulb, first portion of the duodenum                            and second portion of the duodenum were normal.                            Biopsies for histology were taken with a cold                            forceps for evaluation of celiac disease. Estimated                             blood loss was minimal. Complications:            No immediate complications. Estimated Blood Loss:     Estimated blood loss was minimal. Impression:               - Z-line irregular, 35 cm from the incisors.                            Biopsied.                           - Gastroesophageal flap valve classified as Hill  Grade III (minimal fold, loose to endoscope, hiatal                            hernia likely).                           - Gastritis. Biopsied.                           - A few gastric polyps. Resected and retrieved.                           - Normal duodenal bulb, first portion of the                            duodenum and second portion of the duodenum.                            Biopsied. Recommendation:           - Patient has a contact number available for                            emergencies. The signs and symptoms of potential                            delayed complications were discussed with the                            patient. Return to normal activities tomorrow.                            Written discharge instructions were provided to the                            patient.                           - Resume previous diet.                           - Continue present medications.                           - Await pathology results.                           - Perform a colonoscopy today.                           - Use Prilosec (omeprazole) 20 mg PO BID for 6                            weeks to promote mucosal healing, then titrate to                            lowest effective dose, or  can discontinue                            completely if symptoms well controlled. Gerrit Heck, MD 02/08/2020 3:03:33 PM

## 2020-02-08 NOTE — Progress Notes (Signed)
Called to room to assist during endoscopic procedure.  Patient ID and intended procedure confirmed with present staff. Received instructions for my participation in the procedure from the performing physician.  

## 2020-02-08 NOTE — Progress Notes (Signed)
Pt's states no medical or surgical changes since previsit or office visit.  CW - vitals 

## 2020-02-10 ENCOUNTER — Telehealth: Payer: Self-pay | Admitting: Gastroenterology

## 2020-02-10 ENCOUNTER — Telehealth: Payer: Self-pay

## 2020-02-10 NOTE — Telephone Encounter (Signed)
Unable to leave message permission not given.

## 2020-02-10 NOTE — Telephone Encounter (Signed)
Spoke to patient who had an EGD/Colonoscopy on 02/08/20 for CRC screening and chronic GI symptomatology. Patient reports that since his procedures he has noted abdominal pain after eating or drinking,then it will go away. No fever noted.Patient was advised to take the omeprazole ordered post EGD. He may be experiencing extra gas from the procedure and can try OTC gas-x  And try to walk more to expel any trapped gas.He will continue to avoid NSAIDS. Patient will contact the office over the weekend for worsening symptoms. All questions answered. Patient voiced understanding.

## 2020-02-10 NOTE — Telephone Encounter (Signed)
LMOM for patient to call back.

## 2020-02-17 ENCOUNTER — Encounter: Payer: Self-pay | Admitting: Gastroenterology

## 2020-02-22 ENCOUNTER — Other Ambulatory Visit: Payer: Self-pay | Admitting: Family Medicine

## 2020-02-22 DIAGNOSIS — K439 Ventral hernia without obstruction or gangrene: Secondary | ICD-10-CM

## 2020-02-28 DIAGNOSIS — K458 Other specified abdominal hernia without obstruction or gangrene: Secondary | ICD-10-CM | POA: Insufficient documentation

## 2020-02-28 HISTORY — DX: Other specified abdominal hernia without obstruction or gangrene: K45.8

## 2020-03-05 ENCOUNTER — Ambulatory Visit (INDEPENDENT_AMBULATORY_CARE_PROVIDER_SITE_OTHER): Payer: BC Managed Care – PPO | Admitting: Cardiology

## 2020-03-05 ENCOUNTER — Encounter: Payer: Self-pay | Admitting: *Deleted

## 2020-03-05 ENCOUNTER — Ambulatory Visit (INDEPENDENT_AMBULATORY_CARE_PROVIDER_SITE_OTHER): Payer: BC Managed Care – PPO

## 2020-03-05 ENCOUNTER — Other Ambulatory Visit: Payer: Self-pay | Admitting: *Deleted

## 2020-03-05 ENCOUNTER — Other Ambulatory Visit: Payer: Self-pay

## 2020-03-05 VITALS — BP 120/82 | HR 72 | Ht 67.0 in | Wt 194.8 lb

## 2020-03-05 DIAGNOSIS — R42 Dizziness and giddiness: Secondary | ICD-10-CM | POA: Diagnosis not present

## 2020-03-05 DIAGNOSIS — R072 Precordial pain: Secondary | ICD-10-CM

## 2020-03-05 DIAGNOSIS — R0789 Other chest pain: Secondary | ICD-10-CM | POA: Diagnosis not present

## 2020-03-05 DIAGNOSIS — R0602 Shortness of breath: Secondary | ICD-10-CM

## 2020-03-05 MED ORDER — METOPROLOL TARTRATE 100 MG PO TABS: 100.0000 mg | ORAL_TABLET | Freq: Once | ORAL | 0 refills | Status: DC

## 2020-03-05 NOTE — Patient Instructions (Addendum)
Medication Instructions:  No medication changes. *If you need a refill on your cardiac medications before your next appointment, please call your pharmacy*   Lab Work: Your physician recommends that you return for lab work in: 1 week prior to your CT scan. You can come Monday through Friday 8:30 am to 12:00 pm and 1:15 to 4:30. You do not need to make an appointment as the order has already been placed.  If you have labs (blood work) drawn today and your tests are completely normal, you will receive your results only by: Marland Kitchen MyChart Message (if you have MyChart) OR . A paper copy in the mail If you have any lab test that is abnormal or we need to change your treatment, we will call you to review the results.   Testing/Procedures: Your physician has requested that you have an echocardiogram. Echocardiography is a painless test that uses sound waves to create images of your heart. It provides your doctor with information about the size and shape of your heart and how well your heart's chambers and valves are working. This procedure takes approximately one hour. There are no restrictions for this procedure.    Your cardiac CT will be scheduled at:   [facility] [place], [place] [address] [phone]   If scheduled at [facility], please arrive at the [facility] main entrance of [facility] 30 minutes prior to test start time. Proceed to the [facility] Radiology Department (first floor) to check-in and test prep.  Please follow these instructions carefully (unless otherwise directed):  Hold all erectile dysfunction medications at least 3 days (72 hrs) prior to test.  On the Night Before the Test: . Be sure to Drink plenty of water. . Do not consume any caffeinated/decaffeinated beverages or chocolate 12 hours prior to your test. . Do not take any antihistamines 12 hours prior to your test.  On the Day of the Test: . Drink plenty of  water. Do not drink any water within one hour of the test. . Do not eat any food 4 hours prior to the test. . You may take your regular medications prior to the test.  . Take metoprolol (Lopressor) two hours prior to test.  After the Test: . Drink plenty of water. . After receiving IV contrast, you may experience a mild flushed feeling. This is normal. . On occasion, you may experience a mild rash up to 24 hours after the test. This is not dangerous. If this occurs, you can take Benadryl 25 mg and increase your fluid intake. . If you experience trouble breathing, this can be serious. If it is severe call 911 IMMEDIATELY. If it is mild, please call our office. . If you take any of these medications: Glipizide/Metformin, Avandament, Glucavance, please do not take 48 hours after completing test unless otherwise instructed.   Once we have confirmed authorization from your insurance company, we will call you to set up a date and time for your test. Based on how quickly your insurance processes prior authorizations requests, please allow up to 4 weeks to be contacted for scheduling your Cardiac CT appointment. Be advised that routine Cardiac CT appointments could be scheduled as many as 8 weeks after your provider has ordered it.  For non-scheduling related questions, please contact the cardiac imaging nurse navigator should you have any questions/concerns: Marchia Bond, Cardiac Imaging Nurse Navigator Burley Saver, Interim Cardiac Imaging Nurse Navigator [facility]  Direct Office Dial: [phone]   For scheduling needs, including cancellations and rescheduling, please call Vivien Rota at [phone].     WHY IS MY DOCTOR PRESCRIBING ZIO? The Zio system is proven and trusted by physicians to detect and diagnose irregular heart rhythms -- and has been prescribed to hundreds of thousands of patients.  The FDA has cleared the Zio system to monitor for many different kinds of  irregular heart rhythms. In a study, physicians were able to reach a diagnosis 90% of the time with the Zio system1.  You can wear the Zio monitor -- a small, discreet, comfortable patch -- during your normal day-to-day activity, including while you sleep, shower, and exercise, while it records every single heartbeat for analysis.  1Barrett, P., et al. Comparison of 24 Hour Holter Monitoring Versus 14 Day Novel Adhesive Patch Electrocardiographic Monitoring. [place], 2014.  ZIO VS. HOLTER MONITORING The Zio monitor can be comfortably worn for up to 14 days. Holter monitors can be worn for 24 to 48 hours, limiting the time to record any irregular heart rhythms you may have. Zio is able to capture data for the 51% of patients who have their first symptom-triggered arrhythmia after 48 hours.1  LIVE WITHOUT RESTRICTIONS The Zio ambulatory cardiac monitor is a small, unobtrusive, and water-resistant patch--you might even forget you're wearing it. The Zio monitor records and stores every beat of your heart, whether you're sleeping, working out, or showering.  Wear the monitor for 14 days. Remove on 03/19/2020.  Follow-Up: At [facility], you and your health needs are our priority.  As part of our continuing mission to provide you with exceptional heart care, we have created designated Provider Care Teams.  These Care Teams include your primary Cardiologist (physician) and Advanced Practice Providers (APPs -  Physician Assistants and Nurse Practitioners) who all work together to provide you with the care you need, when you need it.  We recommend signing up for the patient portal called "MyChart".  Sign up information is provided on this After Visit Summary.  MyChart is used to connect with patients for Virtual Visits (Telemedicine).  Patients are able to view lab/test results, encounter notes, upcoming appointments, etc.  Non-urgent messages can be sent to your provider as well.    To learn more about what you can do with MyChart, go to [email or web address].    Your next appointment:   3 month(s)  The format for your next appointment:   In Person  Provider:   Berniece Salines, DO   Other Instructions Cardiac CT Angiogram A cardiac CT angiogram is a procedure to look at the heart and the area around the heart. It may be done to help find the cause of chest pains or other symptoms of heart disease. During this procedure, a substance called contrast dye is injected into the blood vessels in the area to be checked. A large X-ray machine, called a CT scanner, then takes detailed pictures of the heart and the surrounding area. The procedure is also sometimes called a coronary CT angiogram, coronary artery scanning, or CTA. A cardiac CT angiogram allows the health care provider to see how well blood is flowing to and from the heart. The health care provider will be able to see if there are any problems, such as:  Blockage or narrowing of the coronary arteries in the heart.  Fluid around the heart.  Signs of weakness or disease in the muscles, valves, and tissues of the heart. Tell a health  care provider about:  Any allergies you have. This is especially important if you have had a previous allergic reaction to contrast dye.  All medicines you are taking, including vitamins, herbs, eye drops, creams, and over-the-counter medicines.  Any blood disorders you have.  Any surgeries you have had.  Any medical conditions you have.  Whether you are pregnant or may be pregnant.  Any anxiety disorders, chronic pain, or other conditions you have that may increase your stress or prevent you from lying still. What are the risks? Generally, this is a safe procedure. However, problems may occur, including: 1. Bleeding. 2. Infection. 3. Allergic reactions to medicines or dyes. 4. Damage to other structures or organs. 5. Kidney damage from the contrast dye that is  used. 6. Increased risk of cancer from radiation exposure. This risk is low. Talk with your health care provider about: ? The risks and benefits of testing. ? How you can receive the lowest dose of radiation. What happens before the procedure? 1. Wear comfortable clothing and remove any jewelry, glasses, dentures, and hearing aids. 2. Follow instructions from your health care provider about eating and drinking. This may include: ? For 12 hours before the procedure -- avoid caffeine. This includes tea, coffee, soda, energy drinks, and diet pills. Drink plenty of water or other fluids that do not have caffeine in them. Being well hydrated can prevent complications. ? For 4-6 hours before the procedure -- stop eating and drinking. The contrast dye can cause nausea, but this is less likely if your stomach is empty. 3. Ask your health care provider about changing or stopping your regular medicines. This is especially important if you are taking diabetes medicines, blood thinners, or medicines to treat problems with erections (erectile dysfunction). What happens during the procedure?  1. Hair on your chest may need to be removed so that small sticky patches called electrodes can be placed on your chest. These will transmit information that helps to monitor your heart during the procedure. 2. An IV will be inserted into one of your veins. 3. You might be given a medicine to control your heart rate during the procedure. This will help to ensure that good images are obtained. 4. You will be asked to lie on an exam table. This table will slide in and out of the CT machine during the procedure. 5. Contrast dye will be injected into the IV. You might feel warm, or you may get a metallic taste in your mouth. 6. You will be given a medicine called nitroglycerin. This will relax or dilate the arteries in your heart. 7. The table that you are lying on will move into the CT machine tunnel for the scan. 8. The  person running the machine will give you instructions while the scans are being done. You may be asked to: ? Keep your arms above your head. ? Hold your breath. ? Stay very still, even if the table is moving. 9. When the scanning is complete, you will be moved out of the machine. 10. The IV will be removed. The procedure may vary among health care providers and hospitals. What can I expect after the procedure? After your procedure, it is common to have:  A metallic taste in your mouth from the contrast dye.  A feeling of warmth.  A headache from the nitroglycerin. Follow these instructions at home:  Take over-the-counter and prescription medicines only as told by your health care provider.  If you are told, drink  enough fluid to keep your urine pale yellow. This will help to flush the contrast dye out of your body.  Most people can return to their normal activities right after the procedure. Ask your health care provider what activities are safe for you.  It is up to you to get the results of your procedure. Ask your health care provider, or the department that is doing the procedure, when your results will be ready.  Keep all follow-up visits as told by your health care provider. This is important. Contact a health care provider if: 1. You have any symptoms of allergy to the contrast dye. These include: ? Shortness of breath. ? Rash or hives. ? A racing heartbeat. Summary  A cardiac CT angiogram is a procedure to look at the heart and the area around the heart. It may be done to help find the cause of chest pains or other symptoms of heart disease.  During this procedure, a large X-ray machine, called a CT scanner, takes detailed pictures of the heart and the surrounding area after a contrast dye has been injected into blood vessels in the area.  Ask your health care provider about changing or stopping your regular medicines before the procedure. This is especially important if  you are taking diabetes medicines, blood thinners, or medicines to treat erectile dysfunction.  If you are told, drink enough fluid to keep your urine pale yellow. This will help to flush the contrast dye out of your body. This information is not intended to replace advice given to you by your health care provider. Make sure you discuss any questions you have with your health care provider. Document Revised: 01/12/2019 Document Reviewed: 01/12/2019 Elsevier Patient Education  [place].   Echocardiogram An echocardiogram is a procedure that uses painless sound waves (ultrasound) to produce an image of the heart. Images from an echocardiogram can provide important information about:  Signs of coronary artery disease (CAD).  Aneurysm detection. An aneurysm is a weak or damaged part of an artery wall that bulges out from the normal force of blood pumping through the body.  Heart size and shape. Changes in the size or shape of the heart can be associated with certain conditions, including heart failure, aneurysm, and CAD.  Heart muscle function.  Heart valve function.  Signs of a past heart attack.  Fluid buildup around the heart.  Thickening of the heart muscle.  A tumor or infectious growth around the heart valves. Tell a health care provider about:  Any allergies you have.  All medicines you are taking, including vitamins, herbs, eye drops, creams, and over-the-counter medicines.  Any blood disorders you have.  Any surgeries you have had.  Any medical conditions you have.  Whether you are pregnant or may be pregnant. What are the risks? Generally, this is a safe procedure. However, problems may occur, including:  Allergic reaction to dye (contrast) that may be used during the procedure. What happens before the procedure? No specific preparation is needed. You may eat and drink normally. What happens during the procedure?   An IV tube may be inserted into one  of your veins.  You may receive contrast through this tube. A contrast is an injection that improves the quality of the pictures from your heart.  A gel will be applied to your chest.  A wand-like tool (transducer) will be moved over your chest. The gel will help to transmit the sound waves from the transducer.  The sound  waves will harmlessly bounce off of your heart to allow the heart images to be captured in real-time motion. The images will be recorded on a computer. The procedure may vary among health care providers and hospitals. What happens after the procedure?  You may return to your normal, everyday life, including diet, activities, and medicines, unless your health care provider tells you not to do that. Summary  An echocardiogram is a procedure that uses painless sound waves (ultrasound) to produce an image of the heart.  Images from an echocardiogram can provide important information about the size and shape of your heart, heart muscle function, heart valve function, and fluid buildup around your heart.  You do not need to do anything to prepare before this procedure. You may eat and drink normally.  After the echocardiogram is completed, you may return to your normal, everyday life, unless your health care provider tells you not to do that. This information is not intended to replace advice given to you by your health care provider. Make sure you discuss any questions you have with your health care provider. Document Revised: 09/09/2018 Document Reviewed: 06/21/2016 Elsevier Patient Education  [place].

## 2020-03-05 NOTE — Progress Notes (Signed)
Cardiology Office Note:    Date:  03/05/2020   ID:  [name], DOB August 23, 1970, MRN [id number]  PCP:  Joaquim Nam, MD  Cardiologist:  Thomasene Ripple, DO  Electrophysiologist:  None   Referring MD: Joaquim Nam, MD   " I have been having chest discomfort and shortness of breath"  History of Present Illness:    [name] is a 49 y.o. male with a hx of family history of premature coronary artery disease, Obesity presents to be evaluated for shortness of breath and chest discomfort.  The patient tells me that over the last several months he has been experiencing significant chest discomfort.  He described as a left-sided chest pressure-like sensation which lasted a few minutes prior to resolution.  Symptoms is 2 to 3 minutes and then resolved.  In addition he has shortness of breath.  He notes that the shortness of breath is different kind of feeling and is usually on exertion when he works outside in his yard.  He did see cardiology back in July 2021 at that time he tells me he has not had any symptoms therefore did not pursue any type of work-up.  Not only is he concerned about his chest discomfort and his shortness of breath.  He has had intermittent dizziness.  He notes that the dizziness is worse and is started about 6 months ago and this has continued to progressively worse.   Past Medical History:  Diagnosis Date  . Abdominal wall hernia 12/05/2019  . Aches 05/12/2018  . Advance care planning 06/14/2017   Living will d/w pt.  Justine Null designated if patient were incapacitated.    . ALLERGIC RHINITIS 01/22/2010   Qualifier: Diagnosis of  By: Etheleen Mayhew CMA (AAMA), Lugene    . Allergy   . Anxiety   . Back pain 12/05/2019  . Chickenpox   . Complication of anesthesia    hard to wake up-does not take much  . Dyskinesia   . FH: CAD (coronary artery disease) 12/05/2019  . FHx: thyroid disease 12/05/2019  . Generalized anxiety disorder 05/31/2010   Formatting of this note  might be different from the original. Overview:  Qualifier: Diagnosis of  By: Para March MD, Cheree Ditto   Last Assessment & Plan:  I would continue as is.  Overall, episodic sx controlled.  I think the occ tiredness may be related to anxiety, busy schedule.  Benign exam o/w and affect appropriate.   Stressful time of life, no continuation of anxiety medical care, per pt msg in [facility]  . Headache(784.0)    Migraine  . History of cholecystectomy   . 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 Inattention 03/11/2011  . Joint pain 08/13/2014  . Medial meniscus tear    left  . Migraine headache 01/22/2010   Qualifier: Diagnosis of  By: Etheleen Mayhew CMA (AAMA), Lugene    . Nasal septal deviation 01/25/2014  . Neck pain 01/05/2019  . Nephrolithiasis   . Osgood-Schlatter's disease, right   . Other specified abdominal hernia without obstruction or gangrene 02/28/2020  . PANCREATITIS, HX OF 02/01/2010   Qualifier: Diagnosis of  By: Dance CMA (AAMA), Kim    . Renal stones 01/22/2010   Qualifier: Diagnosis of  By: Etheleen Mayhew CMA (AAMA), Lugene    . SHOULDER PAIN, LEFT 01/22/2010   Per [facility]    . Skin lesion 02/07/2017  . Tear of meniscus, medial    left knee  .  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 Ulnar neuropathy    mild symptoms   . Unspecified inflammatory spondylopathy, sacral and sacrococcygeal region [facility]) 12/05/2010   Formatting of this note might be different from the original. Overview:  Per [facility]    Past Surgical History:  Procedure Laterality Date  . APPENDECTOMY  2012   lap append  . CHOLECYSTECTOMY  2009  . CLOSED REDUCTION CLAVICLE FRACTURE  1993   left  . COLONOSCOPY  2008  . ESOPHAGOGASTRODUODENOSCOPY     50[place]   . GALLBLADDER SURGERY  2008  . HERNIA REPAIR  2011   repair from gallbladder surgery  . HERNIA REPAIR  5537   umbilical  . LITHOTRIPSY  2000  . mesh surgery  2011   abdominal  . NASAL SEPTOPLASTY W/ TURBINOPLASTY Bilateral 01/25/2014   Procedure:  NASAL SEPTOPLASTY/BILATERAL INFERIOR TURBINATE REDUCTION;  Surgeon: Jerrell Belfast, MD;  Location: [facility];  Service: ENT;  Laterality: Bilateral;  . WISDOM TOOTH EXTRACTION      Current Medications: Current Meds  Medication Sig  . levocetirizine (XYZAL) 5 MG tablet Take 1 tablet (5 mg total) by mouth every evening.     Allergies:   Minocycline, Morphine, Pseudoephedrine, Topiramate, Cephalexin, Citalopram hydrobromide, Cyclobenzaprine hcl, Levaquin [levofloxacin], Sulfonamide derivatives, Sumatriptan, Codeine, and Venlafaxine   Social History   Socioeconomic History  . Marital status: Married    Spouse name: Not on file  . Number of children: Not on file  . Years of education: Not on file  . Highest education level: Not on file  Occupational History  . Occupation: Corporate treasurer, freelance work    Fish farm manager: Therapist, occupational [employer]  . Occupation: PTA  Tobacco Use  . Smoking status: Never Smoker  . Smokeless tobacco: Never Used  Vaping Use  . Vaping Use: Never used  Substance and Sexual Activity  . Alcohol use: Yes    Alcohol/week: 0.0 standard drinks    Comment: 1 glass of wine/month  . Drug use: No  . Sexual activity: Not on file  Other Topics Concern  . Not on file  Social History Narrative   Long term relationship    Regular exercise- yes   Doing relief/PRN PT work.   Social Determinants of Health   Financial Resource Strain:   . Difficulty of Paying Living Expenses: Not on file  Food Insecurity:   . Worried About Charity fundraiser in the Last Year: Not on file  . Ran Out of Food in the Last Year: Not on file  Transportation Needs:   . Lack of Transportation (Medical): Not on file  . Lack of Transportation (Non-Medical): Not on file  Physical Activity:   . Days of Exercise per Week: Not on file  . Minutes of Exercise per Session: Not on file  Stress:   . Feeling of Stress : Not on file  Social Connections:   . Frequency of  Communication with Friends and Family: Not on file  . Frequency of Social Gatherings with Friends and Family: Not on file  . Attends Religious Services: Not on file  . Active Member of Clubs or Organizations: Not on file  . Attends Archivist Meetings: Not on file  . Marital Status: Not on file     Family History: The patient's family history includes Cancer in his mother; Esophageal varices in his mother; Heart disease in his father; Liver disease in his mother. There is no history of Colon cancer, Prostate cancer,  Esophageal cancer, Rectal cancer, or Stomach cancer.  ROS:   Review of Systems  Constitution: Negative for decreased appetite, fever and weight gain.  HENT: Negative for congestion, ear discharge, hoarse voice and sore throat.   Eyes: Negative for discharge, redness, vision loss in right eye and visual halos.  Cardiovascular: Negative for chest pain, dyspnea on exertion, leg swelling, orthopnea and palpitations.  Respiratory: Negative for cough, hemoptysis, shortness of breath and snoring.   Endocrine: Negative for heat intolerance and polyphagia.  Hematologic/Lymphatic: Negative for bleeding problem. Does not bruise/bleed easily.  Skin: Negative for flushing, nail changes, rash and suspicious lesions.  Musculoskeletal: Negative for arthritis, joint pain, muscle cramps, myalgias, neck pain and stiffness.  Gastrointestinal: Negative for abdominal pain, bowel incontinence, diarrhea and excessive appetite.  Genitourinary: Negative for decreased libido, genital sores and incomplete emptying.  Neurological: Negative for brief paralysis, focal weakness, headaches and loss of balance.  Psychiatric/Behavioral: Negative for altered mental status, depression and suicidal ideas.  Allergic/Immunologic: Negative for HIV exposure and persistent infections.    EKGs/Labs/Other Studies Reviewed:    The following studies were reviewed today:   EKG:  The ekg ordered today  demonstrates sinus rhythm heart rate 72 bpm  Recent Labs: 12/02/2019: ALT 28; BUN 10; Creatinine, Ser 1.11; Hemoglobin 17.3; Platelets 196.0; Potassium 4.4; Sodium 139; TSH 3.22  Recent Lipid Panel    Component Value Date/Time   CHOL 149 12/02/2019 1152   TRIG 139.0 12/02/2019 1152   HDL 32.30 (L) 12/02/2019 1152   CHOLHDL 5 12/02/2019 1152   VLDL 27.8 12/02/2019 1152   LDLCALC 89 12/02/2019 1152    Physical Exam:    VS:  BP 120/82 (BP Location: Right Arm, Patient Position: Sitting, Cuff Size: Normal)   Pulse 72   Ht _0  (1.702 m)   Wt 194 lb 12.8 oz (88.4 kg)   SpO2 98%   BMI 30.51 kg/m     Wt Readings from Last 3 Encounters:  03/05/20 194 lb 12.8 oz (88.4 kg)  02/08/20 197 lb (89.4 kg)  02/02/20 196 lb 5 oz (89 kg)     GEN: Well nourished, well developed in no acute distress HEENT: Normal NECK: No JVD; No carotid bruits LYMPHATICS: No lymphadenopathy CARDIAC: S1S2 noted,RRR, no murmurs, rubs, gallops RESPIRATORY:  Clear to auscultation without rales, wheezing or rhonchi  ABDOMEN: Soft, non-tender, non-distended, +bowel sounds, no guarding. EXTREMITIES: No edema, No cyanosis, no clubbing MUSCULOSKELETAL:  No deformity  SKIN: Warm and dry NEUROLOGIC:  Alert and oriented x 3, non-focal PSYCHIATRIC:  Normal affect, good insight  ASSESSMENT:    1. Precordial pain   2. Shortness of breath   3. Chest discomfort   4. Dizziness    PLAN:    He is having symptoms of chest pain along with shortness of breath.  The patient is very concerned given his family history of premature coronary artery disease.  With his symptoms I like to pursue an ischemic evaluation.  We discussed various methods and coronary CTA would be appropriate at this time.  He has no IV contrast allergy.  I would like to rule out a cardiovascular etiology of this dizziness, therefore at this time I would like to placed a zio patch for  14 days. In additon a transthoracic echocardiogram will be ordered to  assess LV/RV function and any structural abnormalities. Once these testing have been performed amd reviewed further reccomendations will be made. For now, I do reccomend that the patient goes to the nearest ED if  symptoms recur.  The patient is in agreement with the above plan. The patient left the office in stable condition.  The patient will follow up in 3 months or sooner if needed.   Medication Adjustments/Labs and Tests Ordered: Current medicines are reviewed at length with the patient today.  Concerns regarding medicines are outlined above.  Orders Placed This Encounter  Procedures  . CT CORONARY MORPH W/CTA COR W/SCORE W/CA W/CM &/OR WO/CM  . CT CORONARY FRACTIONAL FLOW RESERVE DATA PREP  . CT CORONARY FRACTIONAL FLOW RESERVE FLUID ANALYSIS  . Basic Metabolic Panel (BMET)  . LONG TERM MONITOR (3-14 DAYS)  . ECHOCARDIOGRAM COMPLETE   Meds ordered this encounter  Medications  . metoprolol tartrate (LOPRESSOR) 100 MG tablet    Sig: Take 1 tablet (100 mg total) by mouth once for 1 dose. Take 2 hours prior to your CT if your heart rate is greater than 55    Dispense:  1 tablet    Refill:  0    Patient Instructions  Medication Instructions:  No medication changes. *If you need a refill on your cardiac medications before your next appointment, please call your pharmacy*   Lab Work: Your physician recommends that you return for lab work in: 1 week prior to your CT scan. You can come Monday through Friday 8:30 am to 12:00 pm and 1:15 to 4:30. You do not need to make an appointment as the order has already been placed.  If you have labs (blood work) drawn today and your tests are completely normal, you will receive your results only by: Marland Kitchen MyChart Message (if you have MyChart) OR . A paper copy in the mail If you have any lab test that is abnormal or we need to change your treatment, we will call you to review the results.   Testing/Procedures: Your physician has requested that  you have an echocardiogram. Echocardiography is a painless test that uses sound waves to create images of your heart. It provides your doctor with information about the size and shape of your heart and how well your heart's chambers and valves are working. This procedure takes approximately one hour. There are no restrictions for this procedure.    Your cardiac CT will be scheduled at:   [facility] [address] [place], [place] [address] [phone]   If scheduled at [facility], please arrive at the [facility] main entrance of [facility] 30 minutes prior to test start time. Proceed to the [facility] Radiology Department (first floor) to check-in and test prep.  Please follow these instructions carefully (unless otherwise directed):  Hold all erectile dysfunction medications at least 3 days (72 hrs) prior to test.  On the Night Before the Test: . Be sure to Drink plenty of water. . Do not consume any caffeinated/decaffeinated beverages or chocolate 12 hours prior to your test. . Do not take any antihistamines 12 hours prior to your test.  On the Day of the Test: . Drink plenty of water. Do not drink any water within one hour of the test. . Do not eat any food 4 hours prior to the test. . You may take your regular medications prior to the test.  . Take metoprolol (Lopressor) two hours prior to test.  After the Test: . Drink plenty of water. . After receiving IV contrast, you may experience a mild flushed feeling. This is normal. . On occasion, you may experience a mild rash up to 24  hours after the test. This is not dangerous. If this occurs, you can take Benadryl 25 mg and increase your fluid intake. . If you experience trouble breathing, this can be serious. If it is severe call 911 IMMEDIATELY. If it is mild, please call our office. . If you take any of these medications: Glipizide/Metformin, Avandament, Glucavance, please do not take 48 hours  after completing test unless otherwise instructed.   Once we have confirmed authorization from your insurance company, we will call you to set up a date and time for your test. Based on how quickly your insurance processes prior authorizations requests, please allow up to 4 weeks to be contacted for scheduling your Cardiac CT appointment. Be advised that routine Cardiac CT appointments could be scheduled as many as 8 weeks after your provider has ordered it.  For non-scheduling related questions, please contact the cardiac imaging nurse navigator should you have any questions/concerns: Marchia Bond, Cardiac Imaging Nurse Navigator Burley Saver, Interim Cardiac Imaging Nurse [facility] Direct Office Dial: [phone]   For scheduling needs, including cancellations and rescheduling, please call Vivien Rota at [phone].     WHY IS MY DOCTOR PRESCRIBING ZIO? The Zio system is proven and trusted by physicians to detect and diagnose irregular heart rhythms -- and has been prescribed to hundreds of thousands of patients.  The FDA has cleared the Zio system to monitor for many different kinds of irregular heart rhythms. In a study, physicians were able to reach a diagnosis 90% of the time with the Zio system1.  You can wear the Zio monitor -- a small, discreet, comfortable patch -- during your normal day-to-day activity, including while you sleep, shower, and exercise, while it records every single heartbeat for analysis.  1Barrett, P., et al. Comparison of 24 Hour Holter Monitoring Versus 14 Day Novel Adhesive Patch Electrocardiographic Monitoring. [place], 2014.  ZIO VS. HOLTER MONITORING The Zio monitor can be comfortably worn for up to 14 days. Holter monitors can be worn for 24 to 48 hours, limiting the time to record any irregular heart rhythms you may have. Zio is able to capture data for the 51% of patients who have their first  symptom-triggered arrhythmia after 48 hours.1  LIVE WITHOUT RESTRICTIONS The Zio ambulatory cardiac monitor is a small, unobtrusive, and water-resistant patch--you might even forget you're wearing it. The Zio monitor records and stores every beat of your heart, whether you're sleeping, working out, or showering.  Wear the monitor for 14 days. Remove on 03/19/2020.  Follow-Up: At [facility], you and your health needs are our priority.  As part of our continuing mission to provide you with exceptional heart care, we have created designated Provider Care Teams.  These Care Teams include your primary Cardiologist (physician) and Advanced Practice Providers (APPs -  Physician Assistants and Nurse Practitioners) who all work together to provide you with the care you need, when you need it.  We recommend signing up for the patient portal called "MyChart".  Sign up information is provided on this After Visit Summary.  MyChart is used to connect with patients for Virtual Visits (Telemedicine).  Patients are able to view lab/test results, encounter notes, upcoming appointments, etc.  Non-urgent messages can be sent to your provider as well.   To learn more about what you can do with MyChart, go to [email or web address].    Your next appointment:   3 month(s)  The format for your next appointment:  In Person  Provider:   Berniece Salines, DO   Other Instructions Cardiac CT Angiogram A cardiac CT angiogram is a procedure to look at the heart and the area around the heart. It may be done to help find the cause of chest pains or other symptoms of heart disease. During this procedure, a substance called contrast dye is injected into the blood vessels in the area to be checked. A large X-ray machine, called a CT scanner, then takes detailed pictures of the heart and the surrounding area. The procedure is also sometimes called a coronary CT angiogram, coronary artery scanning, or CTA. A cardiac CT  angiogram allows the health care provider to see how well blood is flowing to and from the heart. The health care provider will be able to see if there are any problems, such as:  Blockage or narrowing of the coronary arteries in the heart.  Fluid around the heart.  Signs of weakness or disease in the muscles, valves, and tissues of the heart. Tell a health care provider about:  Any allergies you have. This is especially important if you have had a previous allergic reaction to contrast dye.  All medicines you are taking, including vitamins, herbs, eye drops, creams, and over-the-counter medicines.  Any blood disorders you have.  Any surgeries you have had.  Any medical conditions you have.  Whether you are pregnant or may be pregnant.  Any anxiety disorders, chronic pain, or other conditions you have that may increase your stress or prevent you from lying still. What are the risks? Generally, this is a safe procedure. However, problems may occur, including: 1. Bleeding. 2. Infection. 3. Allergic reactions to medicines or dyes. 4. Damage to other structures or organs. 5. Kidney damage from the contrast dye that is used. 6. Increased risk of cancer from radiation exposure. This risk is low. Talk with your health care provider about: ? The risks and benefits of testing. ? How you can receive the lowest dose of radiation. What happens before the procedure? 1. Wear comfortable clothing and remove any jewelry, glasses, dentures, and hearing aids. 2. Follow instructions from your health care provider about eating and drinking. This may include: ? For 12 hours before the procedure -- avoid caffeine. This includes tea, coffee, soda, energy drinks, and diet pills. Drink plenty of water or other fluids that do not have caffeine in them. Being well hydrated can prevent complications. ? For 4-6 hours before the procedure -- stop eating and drinking. The contrast dye can cause nausea, but this  is less likely if your stomach is empty. 3. Ask your health care provider about changing or stopping your regular medicines. This is especially important if you are taking diabetes medicines, blood thinners, or medicines to treat problems with erections (erectile dysfunction). What happens during the procedure?  1. Hair on your chest may need to be removed so that small sticky patches called electrodes can be placed on your chest. These will transmit information that helps to monitor your heart during the procedure. 2. An IV will be inserted into one of your veins. 3. You might be given a medicine to control your heart rate during the procedure. This will help to ensure that good images are obtained. 4. You will be asked to lie on an exam table. This table will slide in and out of the CT machine during the procedure. 5. Contrast dye will be injected into the IV. You might feel warm, or you may get  a metallic taste in your mouth. 6. You will be given a medicine called nitroglycerin. This will relax or dilate the arteries in your heart. 7. The table that you are lying on will move into the CT machine tunnel for the scan. 8. The person running the machine will give you instructions while the scans are being done. You may be asked to: ? Keep your arms above your head. ? Hold your breath. ? Stay very still, even if the table is moving. 9. When the scanning is complete, you will be moved out of the machine. 10. The IV will be removed. The procedure may vary among health care providers and hospitals. What can I expect after the procedure? After your procedure, it is common to have:  A metallic taste in your mouth from the contrast dye.  A feeling of warmth.  A headache from the nitroglycerin. Follow these instructions at home:  Take over-the-counter and prescription medicines only as told by your health care provider.  If you are told, drink enough fluid to keep your urine pale yellow. This  will help to flush the contrast dye out of your body.  Most people can return to their normal activities right after the procedure. Ask your health care provider what activities are safe for you.  It is up to you to get the results of your procedure. Ask your health care provider, or the department that is doing the procedure, when your results will be ready.  Keep all follow-up visits as told by your health care provider. This is important. Contact a health care provider if: 1. You have any symptoms of allergy to the contrast dye. These include: ? Shortness of breath. ? Rash or hives. ? A racing heartbeat. Summary  A cardiac CT angiogram is a procedure to look at the heart and the area around the heart. It may be done to help find the cause of chest pains or other symptoms of heart disease.  During this procedure, a large X-ray machine, called a CT scanner, takes detailed pictures of the heart and the surrounding area after a contrast dye has been injected into blood vessels in the area.  Ask your health care provider about changing or stopping your regular medicines before the procedure. This is especially important if you are taking diabetes medicines, blood thinners, or medicines to treat erectile dysfunction.  If you are told, drink enough fluid to keep your urine pale yellow. This will help to flush the contrast dye out of your body. This information is not intended to replace advice given to you by your health care provider. Make sure you discuss any questions you have with your health care provider. Document Revised: 01/12/2019 Document Reviewed: 01/12/2019 Elsevier Patient Education  [place].   Echocardiogram An echocardiogram is a procedure that uses painless sound waves (ultrasound) to produce an image of the heart. Images from an echocardiogram can provide important information about:  Signs of coronary artery disease (CAD).  Aneurysm detection. An aneurysm is a  weak or damaged part of an artery wall that bulges out from the normal force of blood pumping through the body.  Heart size and shape. Changes in the size or shape of the heart can be associated with certain conditions, including heart failure, aneurysm, and CAD.  Heart muscle function.  Heart valve function.  Signs of a past heart attack.  Fluid buildup around the heart.  Thickening of the heart muscle.  A tumor or infectious growth  around the heart valves. Tell a health care provider about:  Any allergies you have.  All medicines you are taking, including vitamins, herbs, eye drops, creams, and over-the-counter medicines.  Any blood disorders you have.  Any surgeries you have had.  Any medical conditions you have.  Whether you are pregnant or may be pregnant. What are the risks? Generally, this is a safe procedure. However, problems may occur, including:  Allergic reaction to dye (contrast) that may be used during the procedure. What happens before the procedure? No specific preparation is needed. You may eat and drink normally. What happens during the procedure?   An IV tube may be inserted into one of your veins.  You may receive contrast through this tube. A contrast is an injection that improves the quality of the pictures from your heart.  A gel will be applied to your chest.  A wand-like tool (transducer) will be moved over your chest. The gel will help to transmit the sound waves from the transducer.  The sound waves will harmlessly bounce off of your heart to allow the heart images to be captured in real-time motion. The images will be recorded on a computer. The procedure may vary among health care providers and hospitals. What happens after the procedure?  You may return to your normal, everyday life, including diet, activities, and medicines, unless your health care provider tells you not to do that. Summary  An echocardiogram is a procedure that uses  painless sound waves (ultrasound) to produce an image of the heart.  Images from an echocardiogram can provide important information about the size and shape of your heart, heart muscle function, heart valve function, and fluid buildup around your heart.  You do not need to do anything to prepare before this procedure. You may eat and drink normally.  After the echocardiogram is completed, you may return to your normal, everyday life, unless your health care provider tells you not to do that. This information is not intended to replace advice given to you by your health care provider. Make sure you discuss any questions you have with your health care provider. Document Revised: 09/09/2018 Document Reviewed: 06/21/2016 Elsevier Patient Education  [place].      Adopting a Healthy Lifestyle.  Know what a healthy weight is for you (roughly BMI <25) and aim to maintain this   Aim for 7+ servings of fruits and vegetables daily   65-80+ fluid ounces of water or unsweet tea for healthy kidneys   Limit to max 1 drink of alcohol per day; avoid smoking/tobacco   Limit animal fats in diet for cholesterol and heart health - choose grass fed whenever available   Avoid highly processed foods, and foods high in saturated/trans fats   Aim for low stress - take time to unwind and care for your mental health   Aim for 150 min of moderate intensity exercise weekly for heart health, and weights twice weekly for bone health   Aim for 7-9 hours of sleep daily   When it comes to diets, agreement about the perfect plan isnt easy to find, even among the experts. Experts at the [employer] developed an idea known as the Healthy Eating Plate. Just imagine a plate divided into logical, healthy portions.   The emphasis is on diet quality:   Load up on vegetables and fruits - one-half of your plate: Aim for color and variety, and remember that potatoes dont count.  Go for  whole grains - one-quarter of your plate: Whole wheat, barley, wheat berries, quinoa, oats, brown rice, and foods made with them. If you want pasta, go with whole wheat pasta.   Protein power - one-quarter of your plate: Fish, chicken, beans, and nuts are all healthy, versatile protein sources. Limit red meat.   The diet, however, does go beyond the plate, offering a few other suggestions.   Use healthy plant oils, such as olive, canola, soy, corn, sunflower and peanut. Check the labels, and avoid partially hydrogenated oil, which have unhealthy trans fats.   If youre thirsty, drink water. Coffee and tea are good in moderation, but skip sugary drinks and limit milk and dairy products to one or two daily servings.   The type of carbohydrate in the diet is more important than the amount. Some sources of carbohydrates, such as vegetables, fruits, whole grains, and beans-are healthier than others.   Finally, stay active  Signed, Berniece Salines, DO  03/05/2020 4:30 PM     [facility]

## 2020-03-06 NOTE — Addendum Note (Signed)
Addended by: Resa Miner I on: 03/06/2020 08:35 AM   Modules accepted: Orders

## 2020-03-08 DIAGNOSIS — K432 Incisional hernia without obstruction or gangrene: Secondary | ICD-10-CM | POA: Diagnosis not present

## 2020-03-21 ENCOUNTER — Ambulatory Visit (HOSPITAL_BASED_OUTPATIENT_CLINIC_OR_DEPARTMENT_OTHER)
Admission: RE | Admit: 2020-03-21 | Discharge: 2020-03-21 | Disposition: A | Discharge status: Home / Self Care | Payer: BC Managed Care – PPO | Source: Ambulatory Visit | Attending: Cardiology | Admitting: Cardiology

## 2020-03-21 ENCOUNTER — Other Ambulatory Visit: Payer: Self-pay

## 2020-03-21 DIAGNOSIS — R0789 Other chest pain: Secondary | ICD-10-CM | POA: Diagnosis not present

## 2020-03-21 DIAGNOSIS — R0602 Shortness of breath: Secondary | ICD-10-CM

## 2020-03-21 LAB — ECHOCARDIOGRAM COMPLETE
Area-P 1/2: 3.08 cm2
S' Lateral: 2.85 cm

## 2020-03-22 ENCOUNTER — Telehealth: Payer: Self-pay

## 2020-03-22 LAB — BASIC METABOLIC PANEL
BUN/Creatinine Ratio: 9 (ref 9–20)
BUN: 9 mg/dL (ref 6–24)
CO2: 28 mmol/L (ref 20–29)
Calcium: 9.4 mg/dL (ref 8.7–10.2)
Chloride: 105 mmol/L (ref 96–106)
Creatinine, Ser: 0.98 mg/dL (ref 0.76–1.27)
GFR calc Af Amer: 103 mL/min/{1.73_m2} (ref >59)
GFR calc non Af Amer: 90 mL/min/{1.73_m2} (ref >59)
Glucose: 111 mg/dL — ABNORMAL HIGH (ref 65–99)
Potassium: 4.1 mmol/L (ref 3.5–5.2)
Sodium: 143 mmol/L (ref 134–144)

## 2020-03-22 NOTE — Telephone Encounter (Signed)
-----   Message from Berniece Salines, DO sent at 03/22/2020 11:37 AM EDT -----  Labs normal, blood glucose slightly elevated  compared to 3 months ago otherwise stable labs

## 2020-03-22 NOTE — Telephone Encounter (Signed)
Spoke with patient regarding results and recommendation.  Patient verbalizes understanding and is agreeable to plan of care. Advised patient to call back with any issues or concerns.  

## 2020-03-23 ENCOUNTER — Telehealth: Payer: Self-pay

## 2020-03-23 NOTE — Telephone Encounter (Signed)
Spoke with patient regarding results and recommendation.  Patient verbalizes understanding and is agreeable to plan of care. Advised patient to call back with any issues or concerns.  

## 2020-03-23 NOTE — Telephone Encounter (Signed)
error 

## 2020-03-23 NOTE — Telephone Encounter (Signed)
-----   Message from Berniece Salines, DO sent at 03/23/2020  9:59 AM EDT ----- The echo showed that the heart is not fully relaxing like it should ( diastolic dysfunction) ,but otherwise normal. I will discuss it at the next office visit.

## 2020-03-27 ENCOUNTER — Encounter: Payer: Self-pay | Admitting: Family Medicine

## 2020-03-27 ENCOUNTER — Telehealth (HOSPITAL_COMMUNITY): Payer: Self-pay | Admitting: *Deleted

## 2020-03-27 NOTE — Telephone Encounter (Signed)

## 2020-03-28 ENCOUNTER — Other Ambulatory Visit: Payer: Self-pay

## 2020-03-28 ENCOUNTER — Ambulatory Visit (HOSPITAL_COMMUNITY)
Admission: RE | Admit: 2020-03-28 | Discharge: 2020-03-28 | Disposition: A | Discharge status: Home / Self Care | Payer: BC Managed Care – PPO | Source: Ambulatory Visit | Attending: Cardiology | Admitting: Cardiology

## 2020-03-28 ENCOUNTER — Encounter: Payer: Self-pay | Admitting: *Deleted

## 2020-03-28 DIAGNOSIS — R0602 Shortness of breath: Secondary | ICD-10-CM | POA: Insufficient documentation

## 2020-03-28 DIAGNOSIS — R072 Precordial pain: Secondary | ICD-10-CM | POA: Insufficient documentation

## 2020-03-28 DIAGNOSIS — Z006 Encounter for examination for normal comparison and control in clinical research program: Secondary | ICD-10-CM

## 2020-03-28 DIAGNOSIS — R0789 Other chest pain: Secondary | ICD-10-CM

## 2020-03-28 MED ORDER — NITROGLYCERIN 0.4 MG SL SUBL
0.8000 mg | SUBLINGUAL_TABLET | Freq: Once | SUBLINGUAL | Status: AC
  Administered 2020-03-28: 0.8 mg via SUBLINGUAL

## 2020-03-28 MED ORDER — NITROGLYCERIN 0.4 MG SL SUBL
SUBLINGUAL_TABLET | SUBLINGUAL | Status: AC
  Filled 2020-03-28: qty 2

## 2020-03-28 MED ORDER — IOHEXOL 350 MG/ML SOLN
80.0000 mL | Freq: Once | INTRAVENOUS | Status: AC | PRN
  Administered 2020-03-28: 80 mL via INTRAVENOUS

## 2020-03-28 NOTE — Research (Signed)
CADFEM Informed Consent                  Subject Name:   [name]   Subject met inclusion and exclusion criteria.  The informed consent form, study requirements and expectations were reviewed with the subject and questions and concerns were addressed prior to the signing of the consent form.  The subject verbalized understanding of the trial requirements.  The subject agreed to participate in the CADFEM trial and signed the informed consent.  The informed consent was obtained prior to performance of any protocol-specific procedures for the subject.  A copy of the signed informed consent was given to the subject and a copy was placed in the subject's medical record.   [place] Yarden Manuelito, Research Assistant  03/28/2020 12:10 p.m.

## 2020-03-29 ENCOUNTER — Telehealth: Payer: Self-pay

## 2020-03-29 NOTE — Telephone Encounter (Signed)
Spoke with patient regarding results and recommendation.  Patient verbalizes understanding and is agreeable to plan of care. Advised patient to call back with any issues or concerns.  

## 2020-03-29 NOTE — Telephone Encounter (Signed)
Pt returning call

## 2020-03-29 NOTE — Telephone Encounter (Signed)
-----   Message from Berniece Salines, DO sent at 03/28/2020 11:01 PM EDT ----- Doristine Devoid news, calcium score is 0. No CAD. Mild aortic atherosclerosis.

## 2020-03-29 NOTE — Telephone Encounter (Signed)
Left message on patients voicemail to please return our call.   

## 2020-03-30 DIAGNOSIS — H9313 Tinnitus, bilateral: Secondary | ICD-10-CM | POA: Insufficient documentation

## 2020-03-30 DIAGNOSIS — R42 Dizziness and giddiness: Secondary | ICD-10-CM | POA: Diagnosis not present

## 2020-03-30 DIAGNOSIS — H938X3 Other specified disorders of ear, bilateral: Secondary | ICD-10-CM | POA: Diagnosis not present

## 2020-03-30 DIAGNOSIS — R519 Headache, unspecified: Secondary | ICD-10-CM | POA: Diagnosis not present

## 2020-04-01 ENCOUNTER — Encounter (INDEPENDENT_AMBULATORY_CARE_PROVIDER_SITE_OTHER): Payer: Self-pay

## 2020-04-02 HISTORY — PX: ABDOMINAL SURGERY: SHX537

## 2020-04-04 ENCOUNTER — Other Ambulatory Visit: Payer: Self-pay | Admitting: Family Medicine

## 2020-04-04 DIAGNOSIS — R519 Headache, unspecified: Secondary | ICD-10-CM

## 2020-04-05 ENCOUNTER — Encounter: Payer: Self-pay | Admitting: Family Medicine

## 2020-04-05 ENCOUNTER — Ambulatory Visit (INDEPENDENT_AMBULATORY_CARE_PROVIDER_SITE_OTHER): Payer: BC Managed Care – PPO | Admitting: Family Medicine

## 2020-04-05 ENCOUNTER — Encounter: Payer: Self-pay | Admitting: Neurology

## 2020-04-05 ENCOUNTER — Other Ambulatory Visit: Payer: Self-pay

## 2020-04-05 DIAGNOSIS — R519 Headache, unspecified: Secondary | ICD-10-CM

## 2020-04-05 MED ORDER — ALPRAZOLAM 1 MG PO TABS: ORAL_TABLET | ORAL | 1 refills | Status: DC

## 2020-04-05 NOTE — Progress Notes (Signed)
This visit occurred during the SARS-CoV-2 public health emergency.  Safety protocols were in place, including screening questions prior to the visit, additional usage of staff PPE, and extensive cleaning of exam room while observing appropriate contact time as indicated for disinfecting solutions.  Frontal R forehead/temple HA vs pressure persists.  He has phonophobia. Also photophobia, esp with fluorescent lights.  Can have nausea. D/w pt about neuro referral.  nsaids seem to help (used rarely and there is no thought for his current symptoms to be related to rebound), better last night but HA returned this AM.  He has sensation of tumbling to the L, can occ be room spinning.  He tried balance testing, he did worse with his head level better while looking down.  He doesn't have hearing loss but still has tinnitus.  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   He had tried imitrex w/o relief prev.  He had topamax but didn't tolerate it.    He had cardiology f/u in the meantime.  Nitroglycerin used in the midst of cardiology w/u made the HA/balance sx worse.    He had one episode a week ago with speech (word finding) and gait changes (drifting to the left) when he was out for a walk.   Meds, vitals, and allergies reviewed.   ROS: Per HPI unless specifically indicated in ROS section   GEN: nad, alert and oriented HEENT: ncat NECK: supple w/o LA CV: rrr PULM: ctab, no inc wob ABD: soft, +bs EXT: no edema SKIN: no acute rash CN 2-12 wnl B, S/S/DTR wnl x4  At least 30 minutes were devoted to patient care in this encounter (this can potentially include time spent reviewing the patient's file/history, interviewing and examining the patient, counseling/reviewing plan with patient, ordering referrals, ordering tests, reviewing relevant laboratory or x-ray data, and documenting the encounter).

## 2020-04-05 NOTE — Patient Instructions (Signed)
Let me check with neuro about treatment options and imaging prior to your office visit there.  Take care.  Glad to see you.

## 2020-04-08 NOTE — Assessment & Plan Note (Signed)
Unlikely to be temporal arteritis given his previous low inflammatory markers.  He has multiple symptoms consistent with aura and I question if he has ongoing migraine.  His symptoms are relatively mild at this point.  His headache was clearly worse when he used nitroglycerin.  He has neurology referral pending.  I will ask for neurology input, if it would be useful to get an MRI done before office visit there.  I can order that if needed.  Incidentally he has surgery planned for November 10.  This is outpatient surgery and I do not think his current situation would absolutely prevent that, but it may affect his follow-up timing with neurology.

## 2020-04-11 DIAGNOSIS — K432 Incisional hernia without obstruction or gangrene: Secondary | ICD-10-CM | POA: Diagnosis not present

## 2020-04-12 ENCOUNTER — Telehealth: Payer: Self-pay | Admitting: Family Medicine

## 2020-04-12 DIAGNOSIS — R519 Headache, unspecified: Secondary | ICD-10-CM

## 2020-04-12 MED ORDER — NORTRIPTYLINE HCL 25 MG PO CAPS: 25.0000 mg | ORAL_CAPSULE | Freq: Every day | ORAL | 1 refills | Status: DC

## 2020-04-12 NOTE — Progress Notes (Addendum)
Precert denied per [employer]BS - not a part of conservative treatment.  Your request for Brain (Includes IACs, Pituitary) - MRI does not meet medical necessity criteria based on the information provided  Dr Damita Dunnings walked through the steps of the precertification as well and we were unable to get this approved. Next step is to have Neurology visit moved up and have them order the MRI if they deem necessary. Dr Damita Dunnings agrees with this plan.   Pt is aware. Aware that I will call as soon as I am able to get him a sooner appt with Neuro. He is currently scheduled with [facility] Dr Tomi Likens on 06/19/20.   Pt is aware that I am working on this and will keep him posted.  Varney Daily, CMA Referral Coordinator    ================== Many thanks.  Elsie Stain

## 2020-04-12 NOTE — Telephone Encounter (Signed)
Called and spoke with patient per Dr. Damita Dunnings. Patient verbalized understanding and was very appreciative of call.

## 2020-04-12 NOTE — Telephone Encounter (Addendum)
Please update patient.  I hope his surgery went well.  I checked with neurology.  It makes sense to get MRI brain with and without contrast.  I put in the order.  I would try nortriptyline 25 mg at night and see if that helps with the headaches.  I sent the prescription.  When he feels well enough, please have him check with the neurology clinic and see if he can get put on the cancellation list, to get his appointment moved up.    About nortriptyline-it can cause sedation at higher doses but this is usually tolerated at a lower dose.  It also is usually not a problem when people take it at night.  Thanks.

## 2020-05-03 DIAGNOSIS — Z9889 Other specified postprocedural states: Secondary | ICD-10-CM | POA: Insufficient documentation

## 2020-05-03 DIAGNOSIS — Z8719 Personal history of other diseases of the digestive system: Secondary | ICD-10-CM | POA: Insufficient documentation

## 2020-05-09 NOTE — Progress Notes (Signed)
NEUROLOGY CONSULTATION NOTE  FRISCO CORDTS MRN: [id number] DOB: 25-Mar-1971  Referring provider: Elveria Rising. Damita Dunnings, MD Primary care provider: Elveria Rising. Damita Dunnings, MD  Reason for consult:  Headache, other symptoms   Subjective:  [name] is a 49 year old right-handed male who presents for headache with other symptoms.  History supplemented by cardiology, ENT and referring provider's notes.  He has had headaches and dizziness since December 2019 but symptoms worse since August-September 2021.  He describes a severe splitting headache at the vertex of his head followed by posterior neck pain.  He endorses associated nausea, photophobia, phonophobia.  It occurs about 21 days a month.  He also has a near-constant dull pressure at the vertex as well, often associated with pins and needles sensation on the back of his neck.  He has known degenerative cervical spine disease.  X-ray cervical spine from 01/04/2019 personally reviewed showed multilevel arthritic changes most severe at C3-4.  When his neck acts up, he may develop vertigo described as spinning or tumbling.  Bending over makes the pressure worse but headache otherwise not positional.  He also has fairly persistent visual disturbance described as moving small spots. Sometimes he sees waves like looking over hot pavement. He saw the eye doctor and was told his eyes looked fine.   Orthostatic hypotension was considered.  He was evaluated by cardiology.  Echocardiogram showed EF 60-65% with grade I diastolic dysfunction but overall unremarkable.  Cardiac event monitor was negative.  He also endorses bilateral aural fullness and tinnitus.  He saw ENT.  Meniere's disease was considered but audiogram was normal.  He was advised to start a low-salt diet.  Temporal arteritis was considered but sed rate was 1 and CRP negative.  He does have history of migraines.  Current NSAIDS/analgesics:  ibuprofen Current triptans:  none Current ergotamine:   none Current anti-emetic:  none Current muscle relaxants:  tizandine Current Antihypertensive medications:  none Current Antidepressant medications:  none Current Anticonvulsant medications:  none Current anti-CGRP:  none Current Vitamins/Herbal/Supplements:  none Current Antihistamines/Decongestants:  Xyzal Other therapy:  none Hormone/birth control:  none Other medications:  alprazolam  Past NSAIDS/analgesics:  none Past abortive triptans:  Sumatriptan tab Past abortive ergotamine:  none Past muscle relaxants:  none Past anti-emetic:  none Past antihypertensive medications:  Atenolol Past antidepressant medications:  Citalopram, nortriptyline (took for 10 days but stopped because he didn't like it, phonophobia, nausea) Past anticonvulsant medications:  topiramate Past anti-CGRP:  none Past vitamins/Herbal/Supplements:  none Past antihistamines/decongestants:  Zyrtec Other past therapies:  none   Family history of headache:  Grandfather aneurysm  PAST MEDICAL HISTORY: Past Medical History:  Diagnosis Date  . Abdominal wall hernia 12/05/2019  . Aches 05/12/2018  . Advance care planning 06/14/2017   Living will d/w pt.  Bethanne Ginger designated if patient were incapacitated.    . ALLERGIC RHINITIS 01/22/2010   Qualifier: Diagnosis of  By: Fuller Plan CMA ([place]), [place]    . Allergy   . Back pain 12/05/2019  . Chickenpox   . Complication of anesthesia    hard to wake up-does not take much  . Dyskinesia   . FH: CAD (coronary artery disease) 12/05/2019  . FHx: thyroid disease 12/05/2019  . Generalized anxiety disorder 05/31/2010  . Headache(784.0)    Migraine  . History of cholecystectomy   . 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 Inattention 03/11/2011  . Joint pain 08/13/2014  . Medial meniscus  tear    left  . Migraine headache 01/22/2010   Qualifier: Diagnosis of  By: Fuller Plan CMA ([facility]), Lugene    . Nasal septal deviation 01/25/2014  . Neck pain 01/05/2019  .  Nephrolithiasis   . Osgood-Schlatter's disease, right   . Other specified abdominal hernia without obstruction or gangrene 02/28/2020  . PANCREATITIS, HX OF 02/01/2010   Qualifier: Diagnosis of  By: Dance CMA ([place]), Kim    . Renal stones 01/22/2010   Qualifier: Diagnosis of  By: Fuller Plan CMA ([facility]AMA), Lugene    . SHOULDER PAIN, LEFT 01/22/2010   Per [facility]    . Skin lesion 02/07/2017  . Tear of meniscus, medial    left knee  . 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 Ulnar neuropathy    mild symptoms   . Unspecified inflammatory spondylopathy, sacral and sacrococcygeal region [facility]) 12/05/2010   Formatting of this note might be different from the original. Overview:  Per [facility]    PAST SURGICAL HISTORY: Past Surgical History:  Procedure Laterality Date  . APPENDECTOMY  2012   lap append  . CHOLECYSTECTOMY  2009  . CLOSED REDUCTION CLAVICLE FRACTURE  1993   left  . COLONOSCOPY  2008  . ESOPHAGOGASTRODUODENOSCOPY     [place]   . GALLBLADDER SURGERY  2008  . HERNIA REPAIR  2011   repair from gallbladder surgery  . HERNIA REPAIR  1478   umbilical  . LITHOTRIPSY  2000  . mesh surgery  2011   abdominal  . NASAL SEPTOPLASTY W/ TURBINOPLASTY Bilateral 01/25/2014   Procedure: NASAL SEPTOPLASTY/BILATERAL INFERIOR TURBINATE REDUCTION;  Surgeon: Jerrell Belfast, MD;  Location: [facility];  Service: ENT;  Laterality: Bilateral;  . WISDOM TOOTH EXTRACTION      MEDICATIONS: Current Outpatient Medications on File Prior to Visit  Medication Sig Dispense Refill  . ALPRAZolam (XANAX) 1 MG tablet Take 1/4 to 1/2 tablet by mouth daily as needed. 30 tablet 1  . levocetirizine (XYZAL) 5 MG tablet Take 1 tablet (5 mg total) by mouth every evening. 30 tablet 11  . nortriptyline (PAMELOR) 25 MG capsule Take 1 capsule (25 mg total) by mouth at bedtime. 90 capsule 1   No current facility-administered medications on file prior to visit.    ALLERGIES: Allergies  Allergen  Reactions  . Minocycline Hives    Hives- can tolerate doxycycline.   . Morphine Nausea And Vomiting    REACTION: vomiting  . Pseudoephedrine Other (See Comments)    Urinary retention  . Topiramate Other (See Comments)  . Cephalexin     REACTION: rash  . Citalopram Hydrobromide     Sedation, lack of motivation  . Cyclobenzaprine Hcl     REACTION: sedation  . Levaquin [Levofloxacin] Other (See Comments)    Diffuse aches while on med.   . Sulfonamide Derivatives     REACTION: rash  . Sumatriptan   . Codeine Nausea Only    REACTION: nausea. Can tolerate hydrocodone  . Venlafaxine Nausea Only    Nausea, intolerant    FAMILY HISTORY: Family History  Problem Relation Age of Onset  . Cancer Mother        gyn cancer  . Liver disease Mother        NASH  . Esophageal varices Mother        due to spleen and liver fuctions not working properly   . Heart disease Father   . Colon cancer Neg Hx   . Prostate cancer Neg  Hx   . Esophageal cancer Neg Hx   . Rectal cancer Neg Hx   . Stomach cancer Neg Hx    SOCIAL HISTORY: Social History   Socioeconomic History  . Marital status: Married    Spouse name: Not on file  . Number of children: Not on file  . Years of education: Not on file  . Highest education level: Not on file  Occupational History  . Occupation: Corporate treasurer, freelance work    Fish farm manager: Therapist, occupational [employer]  . Occupation: PTA  Tobacco Use  . Smoking status: Never Smoker  . Smokeless tobacco: Never Used  Vaping Use  . Vaping Use: Never used  Substance and Sexual Activity  . Alcohol use: Yes    Alcohol/week: 0.0 standard drinks    Comment: 1 glass of wine/month  . Drug use: No  . Sexual activity: Not on file  Other Topics Concern  . Not on file  Social History Narrative   Long term relationship    Regular exercise- yes   Doing relief/PRN PT work.   Social Determinants of Health   Financial Resource Strain:   . Difficulty of Paying Living  Expenses: Not on file  Food Insecurity:   . Worried About Charity fundraiser in the Last Year: Not on file  . Ran Out of Food in the Last Year: Not on file  Transportation Needs:   . Lack of Transportation (Medical): Not on file  . Lack of Transportation (Non-Medical): Not on file  Physical Activity:   . Days of Exercise per Week: Not on file  . Minutes of Exercise per Session: Not on file  Stress:   . Feeling of Stress : Not on file  Social Connections:   . Frequency of Communication with Friends and Family: Not on file  . Frequency of Social Gatherings with Friends and Family: Not on file  . Attends Religious Services: Not on file  . Active Member of Clubs or Organizations: Not on file  . Attends Archivist Meetings: Not on file  . Marital Status: Not on file  Intimate Partner Violence:   . Fear of Current or Ex-Partner: Not on file  . Emotionally Abused: Not on file  . Physically Abused: Not on file  . Sexually Abused: Not on file    Objective:  Blood pressure 136/84, pulse 84, height 5\' 7"  (1.702 m), weight 196 lb (88.9 kg), SpO2 95 %. General: No acute distress.  Patient appears well-groomed.   Head:  Normocephalic/atraumatic Eyes:  fundi examined but not visualized Neck: supple, no paraspinal tenderness, full range of motion Back: No paraspinal tenderness Heart: regular rate and rhythm Lungs: Clear to auscultation bilaterally. Vascular: No carotid bruits. Neurological Exam: Mental status: alert and oriented to person, place, and time, recent and remote memory intact, fund of knowledge intact, attention and concentration intact, speech fluent and not dysarthric, language intact. Cranial nerves: CN I: not tested CN II: pupils equal, round and reactive to light, visual fields intact CN III, IV, VI:  full range of motion, no nystagmus, no ptosis CN V: facial sensation intact. CN VII: upper and lower face symmetric CN VIII: hearing intact CN IX, X: gag intact,  uvula midline CN XI: sternocleidomastoid and trapezius muscles intact CN XII: tongue midline Bulk & Tone: normal, no fasciculations. Motor:  muscle strength 5/5 throughout Sensation:  Pinprick, temperature and vibratory sensation intact. Deep Tendon Reflexes:  2+ throughout,  toes downgoing.   Finger to nose testing:  Without dysmetria.   Heel to shin:  Without dysmetria.   Gait:  Normal station and stride.  Romberg negative.  Assessment/Plan:   1.  Vertigo 2.  Visual disturbance 3.  Chronic headache Suspect chronic migraines but given multiple symptoms and family history of cerebral aneurysm, workup is warranted.  1.  MRI of brain w/wo and MRA of head 2.  Start Aimovig 140mg  every 28 days 3.  He will try Ubrelvy 100mg  for headache rescue 4.  Limit use of pain relievers to no more than 2 days out of week to prevent risk of rebound or medication-overuse headache. 5.  Keep headache diary 6.  Follow up 4-6 months.    Thank you for allowing me to take part in the care of this patient.  Metta Clines, DO  CC: Elveria Rising. Damita Dunnings, MD

## 2020-05-10 ENCOUNTER — Encounter: Payer: Self-pay | Admitting: Neurology

## 2020-05-10 ENCOUNTER — Ambulatory Visit (INDEPENDENT_AMBULATORY_CARE_PROVIDER_SITE_OTHER): Payer: BC Managed Care – PPO | Admitting: Neurology

## 2020-05-10 ENCOUNTER — Other Ambulatory Visit: Payer: Self-pay

## 2020-05-10 VITALS — BP 136/84 | HR 84 | Ht 67.0 in | Wt 196.0 lb

## 2020-05-10 DIAGNOSIS — R519 Headache, unspecified: Secondary | ICD-10-CM | POA: Diagnosis not present

## 2020-05-10 DIAGNOSIS — R42 Dizziness and giddiness: Secondary | ICD-10-CM | POA: Diagnosis not present

## 2020-05-10 DIAGNOSIS — Z8249 Family history of ischemic heart disease and other diseases of the circulatory system: Secondary | ICD-10-CM | POA: Diagnosis not present

## 2020-05-10 DIAGNOSIS — H539 Unspecified visual disturbance: Secondary | ICD-10-CM | POA: Diagnosis not present

## 2020-05-10 MED ORDER — AIMOVIG 140 MG/ML ~~LOC~~ SOAJ: 140.0000 mg | SUBCUTANEOUS | 5 refills | Status: DC

## 2020-05-10 NOTE — Patient Instructions (Signed)
1.  We will check MRI of brain with and without contrast and MRA of head 2.  Start Aimovig 140mg  every 28 days 3.  When you get a headache attack, take Ubrelvy 100mg .  May repeat dose in 2 hours if needed.  Maximum tablets in 24 hours.  If effective, contact me for prescription 3.  Limit use of pain relievers to no more than 2 days out of week to prevent risk of rebound or medication-overuse headache. 4.  Keep headache diary 5.  Follow up in 4 to 6 months.

## 2020-05-15 ENCOUNTER — Telehealth: Payer: Self-pay

## 2020-05-15 NOTE — Telephone Encounter (Signed)
PA started on Aimovig 70 mg

## 2020-05-15 NOTE — Telephone Encounter (Signed)
I never heard of that HIPAA waiver before.  We reached out to the drug rep for Aimovig and she never heard of that either.  There really should be no reason that his insurance wouldn't cover any of the CGRP inhibitors.  I would like to see if [employer] will approve Emgality, however he would have to wait another month before he can start it.

## 2020-05-22 ENCOUNTER — Encounter: Payer: Self-pay | Admitting: Neurology

## 2020-05-22 NOTE — Progress Notes (Addendum)
Submitted through [facility] for PA for Aimovig; received denial. I had to mail appeal on 05/15/20. The would not take appeal through fax. ONLY mail. Awaiting decision.   1/7- received phone call from insurance voicemail with information given that appeal for the Aimovig 140 is approved; valid from 06/07/20 to 09/04/20.

## 2020-05-24 DIAGNOSIS — R42 Dizziness and giddiness: Secondary | ICD-10-CM | POA: Diagnosis not present

## 2020-05-28 ENCOUNTER — Other Ambulatory Visit: Payer: Self-pay | Admitting: Neurology

## 2020-05-28 MED ORDER — UBRELVY 100 MG PO TABS: 1.0000 | ORAL_TABLET | ORAL | 5 refills | Status: DC | PRN

## 2020-05-29 DIAGNOSIS — R519 Headache, unspecified: Secondary | ICD-10-CM | POA: Diagnosis not present

## 2020-05-29 DIAGNOSIS — H9313 Tinnitus, bilateral: Secondary | ICD-10-CM | POA: Diagnosis not present

## 2020-05-29 DIAGNOSIS — R42 Dizziness and giddiness: Secondary | ICD-10-CM | POA: Diagnosis not present

## 2020-05-31 ENCOUNTER — Ambulatory Visit
Admission: RE | Admit: 2020-05-31 | Discharge: 2020-05-31 | Disposition: A | Discharge status: Home / Self Care | Payer: BC Managed Care – PPO | Source: Ambulatory Visit | Attending: Neurology | Admitting: Neurology

## 2020-05-31 DIAGNOSIS — R519 Headache, unspecified: Secondary | ICD-10-CM | POA: Diagnosis not present

## 2020-05-31 DIAGNOSIS — H539 Unspecified visual disturbance: Secondary | ICD-10-CM | POA: Diagnosis not present

## 2020-05-31 DIAGNOSIS — R42 Dizziness and giddiness: Secondary | ICD-10-CM | POA: Diagnosis not present

## 2020-05-31 MED ORDER — GADOBENATE DIMEGLUMINE 529 MG/ML IV SOLN
18.0000 mL | Freq: Once | INTRAVENOUS | Status: AC | PRN
  Administered 2020-05-31: 18 mL via INTRAVENOUS

## 2020-06-06 ENCOUNTER — Telehealth: Payer: Self-pay

## 2020-06-06 ENCOUNTER — Encounter: Payer: Self-pay | Admitting: Neurology

## 2020-06-06 DIAGNOSIS — R42 Dizziness and giddiness: Secondary | ICD-10-CM | POA: Diagnosis not present

## 2020-06-06 NOTE — Telephone Encounter (Signed)
-----   Message from Drema Dallas, DO sent at 05/31/2020  3:41 PM EST ----- MRI/MRA of brain normal.  No tumor, stroke or aneurysm or other potential serious etiologies that would cause headaches.

## 2020-06-06 NOTE — Telephone Encounter (Signed)
Appeal for the aimovig is still pending. I had to mail that one in so usually they take a little longer.   The [name] was denied because patient didn't fail 2 triptans. But I saw where Dr. Everlena Cooper prescribed him a new triptan to try.   I will let you know as soon as I get a determination about the aimovig though. Hopefully soon.

## 2020-06-06 NOTE — Telephone Encounter (Signed)
Pt advised of his results.    Pt wanted to know if his appeal has been approved or not if not he will need a samples as he is due for his injection on 06/10/20

## 2020-06-06 NOTE — Progress Notes (Signed)
Pt advised of MRI/MRA results.

## 2020-06-06 NOTE — Progress Notes (Signed)
PA for the Ubrelvy medication was denied because patient has only tried and failed one triptan (sumatriptan) oral and not 2. Looks like Dr. Everlena Cooper is prescribing another triptan per prev message. Will await to see if that medication needs PA. Aldean Baker PA denial letter to scanning for chart.

## 2020-06-07 ENCOUNTER — Other Ambulatory Visit: Payer: Self-pay | Admitting: Neurology

## 2020-06-07 MED ORDER — RIZATRIPTAN BENZOATE 10 MG PO TBDP: ORAL_TABLET | ORAL | 5 refills | Status: DC

## 2020-06-11 DIAGNOSIS — R42 Dizziness and giddiness: Secondary | ICD-10-CM | POA: Diagnosis not present

## 2020-06-18 DIAGNOSIS — G249 Dystonia, unspecified: Secondary | ICD-10-CM | POA: Insufficient documentation

## 2020-06-18 DIAGNOSIS — M7542 Impingement syndrome of left shoulder: Secondary | ICD-10-CM | POA: Insufficient documentation

## 2020-06-18 DIAGNOSIS — S83249A Other tear of medial meniscus, current injury, unspecified knee, initial encounter: Secondary | ICD-10-CM | POA: Insufficient documentation

## 2020-06-18 DIAGNOSIS — M92521 Juvenile osteochondrosis of tibia tubercle, right leg: Secondary | ICD-10-CM | POA: Insufficient documentation

## 2020-06-18 DIAGNOSIS — T8859XA Other complications of anesthesia, initial encounter: Secondary | ICD-10-CM | POA: Insufficient documentation

## 2020-06-18 DIAGNOSIS — G5620 Lesion of ulnar nerve, unspecified upper limb: Secondary | ICD-10-CM | POA: Insufficient documentation

## 2020-06-18 DIAGNOSIS — T7840XA Allergy, unspecified, initial encounter: Secondary | ICD-10-CM | POA: Insufficient documentation

## 2020-06-18 DIAGNOSIS — N200 Calculus of kidney: Secondary | ICD-10-CM | POA: Insufficient documentation

## 2020-06-19 ENCOUNTER — Encounter: Payer: Self-pay | Admitting: Family Medicine

## 2020-06-19 ENCOUNTER — Ambulatory Visit: Payer: BC Managed Care – PPO | Admitting: Neurology

## 2020-06-20 ENCOUNTER — Ambulatory Visit: Payer: BC Managed Care – PPO | Admitting: Cardiology

## 2020-06-27 DIAGNOSIS — R42 Dizziness and giddiness: Secondary | ICD-10-CM | POA: Diagnosis not present

## 2020-06-27 NOTE — Progress Notes (Signed)
Received approval for the Ubrelvy medication valid from 06/20/20 to 06/19/21. Case #: [id number]  Sent approval to scanning.

## 2020-06-28 ENCOUNTER — Encounter: Payer: Self-pay | Admitting: Family Medicine

## 2020-06-28 ENCOUNTER — Ambulatory Visit: Payer: BC Managed Care – PPO | Admitting: Family Medicine

## 2020-06-28 ENCOUNTER — Other Ambulatory Visit: Payer: Self-pay

## 2020-06-28 DIAGNOSIS — G43909 Migraine, unspecified, not intractable, without status migrainosus: Secondary | ICD-10-CM | POA: Diagnosis not present

## 2020-06-28 DIAGNOSIS — M549 Dorsalgia, unspecified: Secondary | ICD-10-CM

## 2020-06-28 MED ORDER — PREDNISONE 10 MG PO TABS: ORAL_TABLET | ORAL | 0 refills | Status: DC

## 2020-06-28 NOTE — Progress Notes (Signed)
This visit occurred during the SARS-CoV-2 public health emergency.  Safety protocols were in place, including screening questions prior to the visit, additional usage of staff PPE, and extensive cleaning of exam room while observing appropriate contact time as indicated for disinfecting solutions.  His headaches are better.  D/w pt.  Clearly better with aimovig, rare need for ubrelvy in the meantime, used with relief.    He noted some other aches once the HA got better.  He has talked with PT about cervicogenic HA.  Still with some sinus pressure.  He fell on R hip, on the ice recently.  mobic and ibuprofen didn't help.  No LOC.  He can tolerate prednisone.  Pain in R lower back and R thigh, less pain in the L lower back.    Meds, vitals, and allergies reviewed.   ROS: Per HPI unless specifically indicated in ROS section   nad ncat R>L sinus congestion on nasal exam but sinuses not ttp x4 OP wnl Neck supple, no LA rrr ctab Abd soft, not ttp Ext without edema. Skin well perfused. Midline back not tender.

## 2020-06-28 NOTE — Patient Instructions (Signed)
Prednisone with food, don't take aleve or ibuprofen or meloxicam.  Keep stretching.  Update me as needed.  Take care.  Glad to see you.

## 2020-07-01 NOTE — Assessment & Plan Note (Signed)
Improved with Aimovig and rare need for ubrelvy.  Would continue as is.

## 2020-07-01 NOTE — Assessment & Plan Note (Signed)
Discussed options. Reasonable to take prednisone with food, advised not to take aleve or ibuprofen or meloxicam.  Keep stretching.  Update me as needed.   It may also be the case the prednisone could help with nasal congestion.

## 2020-07-05 ENCOUNTER — Encounter: Payer: Self-pay | Admitting: *Deleted

## 2020-07-05 DIAGNOSIS — Z006 Encounter for examination for normal comparison and control in clinical research program: Secondary | ICD-10-CM

## 2020-07-05 NOTE — Research (Signed)
I called patient for 90 day phone call for CAd-Fem study. Patient is still having the deep pinching in the chest which can lasts up to 15 minutes. Patient has not sought any other medical care for this symptom. Patient is to follow-up with Dr.Tobb. I thanked patient for participating the study and will call him again in October which is 1 year after his enrollment.

## 2020-07-11 ENCOUNTER — Other Ambulatory Visit: Payer: Self-pay | Admitting: Neurology

## 2020-07-11 MED ORDER — EMGALITY 120 MG/ML ~~LOC~~ SOAJ: 240.0000 mg | Freq: Once | SUBCUTANEOUS | 0 refills | Status: AC

## 2020-07-11 MED ORDER — EMGALITY 120 MG/ML ~~LOC~~ SOAJ: 120.0000 mg | SUBCUTANEOUS | 5 refills | Status: DC

## 2020-07-11 NOTE — Telephone Encounter (Signed)
Script sent.  He should know that the first dose will be two pens (two injections).  Then it will be just one injection every 28 days thereafter

## 2020-07-11 NOTE — Telephone Encounter (Signed)
Then I would switch to [employer]

## 2020-07-12 ENCOUNTER — Encounter: Payer: Self-pay | Admitting: Neurology

## 2020-07-12 NOTE — Progress Notes (Signed)
REDELL NAZIR Key: [id number] - PA Case ID: [id number] - Rx #: [id number] Need help? Call us at [phone] Outcome Approvedtoday PA Case: [id number], Status: Approved, Coverage Starts on: 07/12/2020 12:00:00 AM, Coverage Ends on: 10/04/2020 12:00:00 AM. Drug Emgality 120MG /ML auto-injectors (migraine) Form Librarian, academic PA Form (2017 NCPDP) Original Claim Info 75

## 2020-07-12 NOTE — Telephone Encounter (Signed)
The Emgality has been approved and I already messaged patient to let him know. Thanks!

## 2020-07-13 ENCOUNTER — Encounter: Payer: Self-pay | Admitting: Family Medicine

## 2020-07-13 ENCOUNTER — Telehealth: Payer: Self-pay | Admitting: Gastroenterology

## 2020-07-13 DIAGNOSIS — Z9049 Acquired absence of other specified parts of digestive tract: Secondary | ICD-10-CM | POA: Insufficient documentation

## 2020-07-13 NOTE — Telephone Encounter (Signed)
Patient called requested to speak with a nurse

## 2020-07-13 NOTE — Telephone Encounter (Signed)
Spoke to patient to schedule appointment for possible fistula,patient will go to urgent care if symptoms worsen.

## 2020-07-13 NOTE — Telephone Encounter (Signed)
I am sending the patient a MyChart message but given the situation please call him in case he does not see that immediately. I think he needs to get checked sooner rather than later. I am out of town today and I do not have availability in clinic. Please see if anyone can evaluate the patient or he may have to go to urgent care. I apologize for the inconvenience but that is the best advice I can offer.

## 2020-07-15 ENCOUNTER — Telehealth: Payer: Self-pay | Admitting: Family Medicine

## 2020-07-15 NOTE — Telephone Encounter (Signed)
Please get update on patient Monday. Thanks. See previous MyChart messages.

## 2020-07-16 ENCOUNTER — Other Ambulatory Visit: Payer: Self-pay

## 2020-07-16 ENCOUNTER — Other Ambulatory Visit: Payer: Self-pay | Admitting: Gastroenterology

## 2020-07-16 ENCOUNTER — Ambulatory Visit: Payer: BC Managed Care – PPO | Admitting: Cardiology

## 2020-07-16 ENCOUNTER — Other Ambulatory Visit (INDEPENDENT_AMBULATORY_CARE_PROVIDER_SITE_OTHER): Payer: BC Managed Care – PPO

## 2020-07-16 ENCOUNTER — Encounter: Payer: Self-pay | Admitting: Cardiology

## 2020-07-16 VITALS — BP 148/98 | HR 82 | Ht 67.0 in | Wt 195.0 lb

## 2020-07-16 DIAGNOSIS — K641 Second degree hemorrhoids: Secondary | ICD-10-CM | POA: Diagnosis not present

## 2020-07-16 DIAGNOSIS — I5189 Other ill-defined heart diseases: Secondary | ICD-10-CM

## 2020-07-16 DIAGNOSIS — E669 Obesity, unspecified: Secondary | ICD-10-CM | POA: Diagnosis not present

## 2020-07-16 DIAGNOSIS — R030 Elevated blood-pressure reading, without diagnosis of hypertension: Secondary | ICD-10-CM | POA: Diagnosis not present

## 2020-07-16 DIAGNOSIS — M461 Sacroiliitis, not elsewhere classified: Secondary | ICD-10-CM | POA: Diagnosis not present

## 2020-07-16 DIAGNOSIS — Z6830 Body mass index (BMI) 30.0-30.9, adult: Secondary | ICD-10-CM | POA: Diagnosis not present

## 2020-07-16 NOTE — Telephone Encounter (Signed)
Spoke with patient and he is doing much better today. States everything started after he took his Aimovig injection; he notified neurology and they changed his rx to [facility]. Patient has not tried this medication yet. He did say his fistula ruptured Friday afternoon and that helped some of the pain go away. He did start getting a low grade fever Friday thru Sunday but that is now gone; he was taking tylenol for this. He sees GI tomorrow and states he will go from there. Pain score today is at a 2.

## 2020-07-16 NOTE — Progress Notes (Signed)
Cardiology Office Note:    Date:  07/16/2020   ID:  [name], DOB [birth year], MRN [id number]  PCP:  Tonia Ghent, MD  Cardiologist:  Berniece Salines, DO  Electrophysiologist:  None   Referring MD: Tonia Ghent, MD   I am doing fine from a heart standpoint but have had other things going on  History of Present Illness:    [name] is a 50 y.o. male with a hx of anxiety, migraine headaches is here today for follow-up visit.  Did see the patient in October 2021 at that time due to his symptoms of premature coronary artery disease in his father I recommended he undergo coronary CTA as well as monitored for his palpitations and dizziness and shortness of breath.  In the interim he was able to get his testing done which did not show any evidence of coronary artery disease and nothing remarkable.  I was able to share this with the patient.  Since I last saw the patient he has had some trouble with migraine headaches he was placed on a medication by his neurologist which caused complication and GI side effects.  He also is planning to see GI because he now does have some rectal spasm constipation as well as a fistula.  No other complaints at this time.  Past Medical History:  Diagnosis Date  . Abdominal wall hernia 12/05/2019  . Aches 05/12/2018  . Advance care planning 06/14/2017   Living will d/w pt.  Bethanne Ginger designated if patient were incapacitated.    . ALLERGIC RHINITIS 01/22/2010   Qualifier: Diagnosis of  By: Fuller Plan CMA ([place]), [place]    . Allergy   . Back pain 12/05/2019  . Chickenpox   . Complication of anesthesia    hard to wake up-does not take much  . Dyskinesia   . FH: CAD (coronary artery disease) 12/05/2019  . FHx: thyroid disease 12/05/2019  . Generalized anxiety disorder 05/31/2010  . Headache(784.0)    Migraine  . History of cholecystectomy   . 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 Inattention 03/11/2011  . Joint pain 08/13/2014  .  Medial meniscus tear    left  . Migraine headache 01/22/2010   Qualifier: Diagnosis of  By: Fuller Plan CMA (AAMA), Lug[place]    . Nasal septal deviation 01/25/2014  . Neck pain 01/05/2019  . Nephrolithiasis   . Other specified abdominal hernia without obstruction or gangrene 02/28/2020  . PANCREATITIS, HX OF 02/01/2010   Qualifier: Diagnosis of  By: Dance CMA ([place]), Kim    . Renal stones 01/22/2010   Qualifier: Diagnosis of  By: Fuller Plan CMA (AAMA), Lugene    . SHOULDER PAIN, LEFT 01/22/2010   Per [facility]    . Skin lesion 02/07/2017  . Tear of meniscus, medial    left knee  . 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 Ulnar neuropathy    mild symptoms   . Unspecified inflammatory spondylopathy, sacral and sacrococcygeal region [facility]) 12/05/2010   Formatting of this note might be different from the original. Overview:  Per [facility]    Past Surgical History:  Procedure Laterality Date  . APPENDECTOMY  2012   lap append  . CHOLECYSTECTOMY  2009  . CLOSED REDUCTION CLAVICLE FRACTURE  1993   left  . COLONOSCOPY  2008  . ESOPHAGOGASTRODUODENOSCOPY     69[place]   . GALLBLADDER SURGERY  2008  . HERNIA REPAIR  2011  repair from gallbladder surgery  . HERNIA REPAIR  1194   umbilical  . LITHOTRIPSY  2000  . mesh surgery  2011   abdominal  . NASAL SEPTOPLASTY W/ TURBINOPLASTY Bilateral 01/25/2014   Procedure: NASAL SEPTOPLASTY/BILATERAL INFERIOR TURBINATE REDUCTION;  Surgeon: Jerrell Belfast, MD;  Location: [place];  Service: ENT;  Laterality: Bilateral;  . WISDOM TOOTH EXTRACTION      Current Medications: Current Meds  Medication Sig  . ALPRAZolam (XANAX) 1 MG tablet Take 1/4 to 1/2 tablet by mouth daily as needed.  . Galcanezumab-gnlm (EMGALITY) 120 MG/ML SOAJ Inject 120 mg into the skin every 28 (twenty-eight) days.  Marland Kitchen levocetirizine (XYZAL) 5 MG tablet Take 1 tablet (5 mg total) by mouth every evening.  Marland Kitchen TRAMADOL HCL PO Take 10 mg by mouth as needed (for back  pain).  . UBRELVY 100 MG TABS TAKE ONE TABLET BY MOUTH ONE TIME DAILY AS NEEDED (MAY REPEAT IN 2 HOURS, MAXIMUM 2 TABLETS IN 24 HOURS)     Allergies:   Minocycline, Morphine, Pseudoephedrine, Topiramate, Cephalexin, Citalopram hydrobromide, Cyclobenzaprine hcl, Levaquin [levofloxacin], Sulfonamide derivatives, Sumatriptan, Codeine, and Venlafaxine   Social History   Socioeconomic History  . Marital status: Married    Spouse name: Not on file  . Number of children: Not on file  . Years of education: Not on file  . Highest education level: Not on file  Occupational History  . Occupation: Corporate treasurer, freelance work    Fish farm manager: Therapist, occupational OF [employer]  . Occupation: PTA  Tobacco Use  . Smoking status: Never Smoker  . Smokeless tobacco: Never Used  Vaping Use  . Vaping Use: Never used  Substance and Sexual Activity  . Alcohol use: Yes    Alcohol/week: 0.0 standard drinks    Comment: 1 glass of wine/month  . Drug use: No  . Sexual activity: Not on file  Other Topics Concern  . Not on file  Social History Narrative   Long term relationship    Regular exercise- yes   Doing relief/PRN PT work.   Social Determinants of Health   Financial Resource Strain: Not on file  Food Insecurity: Not on file  Transportation Needs: Not on file  Physical Activity: Not on file  Stress: Not on file  Social Connections: Not on file     Family History: The patient's family history includes Cancer in his mother; Esophageal varices in his mother; Heart disease in his father; Liver disease in his mother. There is no history of Colon cancer, Prostate cancer, Esophageal cancer, Rectal cancer, or Stomach cancer.  ROS:   Review of Systems  Constitution: Negative for decreased appetite, fever and weight gain.  HENT: Negative for congestion, ear discharge, hoarse voice and sore throat.   Eyes: Negative for discharge, redness, vision loss in right eye and visual halos.  Cardiovascular:  Negative for chest pain, dyspnea on exertion, leg swelling, orthopnea and palpitations.  Respiratory: Negative for cough, hemoptysis, shortness of breath and snoring.   Endocrine: Negative for heat intolerance and polyphagia.  Hematologic/Lymphatic: Negative for bleeding problem. Does not bruise/bleed easily.  Skin: Negative for flushing, nail changes, rash and suspicious lesions.  Musculoskeletal: Negative for arthritis, joint pain, muscle cramps, myalgias, neck pain and stiffness.  Gastrointestinal: Negative for abdominal pain, bowel incontinence, diarrhea and excessive appetite.  Genitourinary: Negative for decreased libido, genital sores and incomplete emptying.  Neurological: Negative for brief paralysis, focal weakness, headaches and loss of balance.  Psychiatric/Behavioral: Negative for altered mental status, depression  and suicidal ideas.  Allergic/Immunologic: Negative for HIV exposure and persistent infections.    EKGs/Labs/Other Studies Reviewed:    The following studies were reviewed today:   EKG:  The ekg ordered today demonstrates   Coronary CTA Coronary Arteries:  Normal coronary origin.  Right dominance.  RCA is a large dominant artery that gives rise to PDA and PLVB. There is no plaque.  Left main is a large artery that gives rise to LAD and LCX arteries.  LAD is a large vessel that has no plaque. D1 with no plaques. There is mild myocardial bridging in a small portion of the mid LAD.  LCX is a non-dominant artery that gives rise to one large OM1 branch. There is no plaque.  Other findings:  Normal pulmonary vein drainage into the left atrium.  Normal left atrial appendage without a thrombus.  Normal size of the pulmonary artery.  IMPRESSION: 1. Coronary calcium score of 0. This was 0 percentile for age and sex matched control.  2. Normal coronary origin with right dominance.  3. No evidence of CAD.  ZIO monitor The patient wore the  monitor for 11 days starting March 05, 2020 . Indication: Dizziness The minimum heart rate was 42 bpm, maximum heart rate was 74 bpm, and average heart rate was 159 bpm. Predominant underlying rhythm was Sinus Rhythm.  Premature atrial complexes were rare less than 1%. Premature Ventricular complexes rare less than 1%. No ventricular tachycardia, no pauses, No AV block, no supraventricular tachycardia and no atrial fibrillation present. 24 patient triggered events and 15 diary events are associated with sinus rhythm.   Conclusion: Unremarkable/normal study with no evidence of significant arrhythmia.   Transthoracic echocardiogramIMPRESSIONS  1. Left ventricular ejection fraction, by estimation, is 60 to 65%. The  left ventricle has normal function. The left ventricle has no regional  wall motion abnormalities. Left ventricular diastolic parameters are  consistent with Grade I diastolic  dysfunction (impaired relaxation).  2. Right ventricular systolic function is normal. The right ventricular  size is normal.  3. The mitral valve is normal in structure. No evidence of mitral valve  regurgitation. No evidence of mitral stenosis.  4. The aortic valve is tricuspid. Aortic valve regurgitation is not  visualized. No aortic stenosis is present.  5. The inferior vena cava is normal in size with greater than 50%  respiratory variability, suggesting right atrial pressure of 3 mmHg.   Recent Labs: 12/02/2019: ALT 28; Hemoglobin 17.3; Platelets 196.0; TSH 3.22 03/21/2020: BUN 9; Creatinine, Ser 0.98; Potassium 4.1; Sodium 143  Recent Lipid Panel    Component Value Date/Time   CHOL 149 12/02/2019 1152   TRIG 139.0 12/02/2019 1152   HDL 32.30 (L) 12/02/2019 1152   CHOLHDL 5 12/02/2019 1152   VLDL 27.8 12/02/2019 1152   LDLCALC 89 12/02/2019 1152    Physical Exam:    VS:  BP (!) 152/102   Pulse 82   Ht 5\' 7"  (1.702 m)   Wt 195 lb (88.5 kg)   SpO2 98%   BMI 30.54 kg/m      Wt Readings from Last 3 Encounters:  07/16/20 195 lb (88.5 kg)  06/28/20 200 lb (90.7 kg)  05/10/20 196 lb (88.9 kg)     GEN: Well nourished, well developed in no acute distress HEENT: Normal NECK: No JVD; No carotid bruits LYMPHATICS: No lymphadenopathy CARDIAC: S1S2 noted,RRR, no murmurs, rubs, gallops RESPIRATORY:  Clear to auscultation without rales, wheezing or rhonchi  ABDOMEN: Soft, non-tender, non-distended, +bowel  sounds, no guarding. EXTREMITIES: No edema, No cyanosis, no clubbing MUSCULOSKELETAL:  No deformity  SKIN: Warm and dry NEUROLOGIC:  Alert and oriented x 3, non-focal PSYCHIATRIC:  Normal affect, good insight  ASSESSMENT:    1. Elevated blood pressure reading   2. Obesity (BMI 30-39.9)   3. Grade I diastolic dysfunction    PLAN:     His blood pressure is elevated in the office today.  But the patient tells me he is in a lot of pain and this could be multifactorial so for now what we decided I will have the patient take his blood pressure daily and after 2 to 4 weeks we will send the information for my review to determine if the patient needs to be started on antihypertensive medication.  I also educated patient what it means to have grade 1 diastolic dysfunction all of his questions has been answered.  The patient understands the need to lose weight with diet and exercise. We have discussed specific strategies for this.  The patient is in agreement with the above plan. The patient left the office in stable condition.  The patient will follow up in   Medication Adjustments/Labs and Tests Ordered: Current medicines are reviewed at length with the patient today.  Concerns regarding medicines are outlined above.  No orders of the defined types were placed in this encounter.  No orders of the defined types were placed in this encounter.   There are no Patient Instructions on file for this visit.   Adopting a Healthy Lifestyle.  Know what a healthy  weight is for you (roughly BMI <25) and aim to maintain this   Aim for 7+ servings of fruits and vegetables daily   65-80+ fluid ounces of water or unsweet tea for healthy kidneys   Limit to max 1 drink of alcohol per day; avoid smoking/tobacco   Limit animal fats in diet for cholesterol and heart health - choose grass fed whenever available   Avoid highly processed foods, and foods high in saturated/trans fats   Aim for low stress - take time to unwind and care for your mental health   Aim for 150 min of moderate intensity exercise weekly for heart health, and weights twice weekly for bone health   Aim for 7-9 hours of sleep daily   When it comes to diets, agreement about the perfect plan isnt easy to find, even among the experts. Experts at the [employer] developed an idea known as the Healthy Eating Plate. Just imagine a plate divided into logical, healthy portions.   The emphasis is on diet quality:   Load up on vegetables and fruits - one-half of your plate: Aim for color and variety, and remember that potatoes dont count.   Go for whole grains - one-quarter of your plate: Whole wheat, barley, wheat berries, quinoa, oats, brown rice, and foods made with them. If you want pasta, go with whole wheat pasta.   Protein power - one-quarter of your plate: Fish, chicken, beans, and nuts are all healthy, versatile protein sources. Limit red meat.   The diet, however, does go beyond the plate, offering a few other suggestions.   Use healthy plant oils, such as olive, canola, soy, corn, sunflower and peanut. Check the labels, and avoid partially hydrogenated oil, which have unhealthy trans fats.   If youre thirsty, drink water. Coffee and tea are good in moderation, but skip sugary drinks and limit milk and dairy products to  one or two daily servings.   The type of carbohydrate in the diet is more important than the amount. Some sources of carbohydrates, such as  vegetables, fruits, whole grains, and beans-are healthier than others.   Finally, stay active  Signed, Berniece Salines, DO  07/16/2020 1:45 PM    [facility]

## 2020-07-16 NOTE — Telephone Encounter (Signed)
No prior known history of perianal fistula or IBD. Could have been thrombosed external hemorrhoid that has since depressurized. Agree with plan for expedited appt tomorrow. Can check CBC beforehand, but not planning Abx in absence of other sxs.

## 2020-07-16 NOTE — Telephone Encounter (Signed)
Spoke to patient this morning who reports that his questionable fistula or boil "ruptured" over the weekend. He has been running a low grade fever or 100* he usually runs 97.5. Patient has an appointment on 07/17/20 for this problem. He was asking if blood work should be done prior or start on antibiotics. He is taking Acetaminophen and lidocaine for the pain.

## 2020-07-16 NOTE — Telephone Encounter (Signed)
Spoke to patient to inform him of Dr Vivia Ewing recommendations for labs prior to appointment 08/14/20. Patient will come by for labs later today

## 2020-07-16 NOTE — Patient Instructions (Signed)
Medication Instructions:  Your physician recommends that you continue on your current medications as directed. Please refer to the Current Medication list given to you today.  *If you need a refill on your cardiac medications before your next appointment, please call your pharmacy*   Lab Work: None If you have labs (blood work) drawn today and your tests are completely normal, you will receive your results only by: Marland Kitchen MyChart Message (if you have MyChart) OR . A paper copy in the mail If you have any lab test that is abnormal or we need to change your treatment, we will call you to review the results.   Testing/Procedures: None   Follow-Up: At [facility], you and your health needs are our priority.  As part of our continuing mission to provide you with exceptional heart care, we have created designated Provider Care Teams.  These Care Teams include your primary Cardiologist (physician) and Advanced Practice Providers (APPs -  Physician Assistants and Nurse Practitioners) who all work together to provide you with the care you need, when you need it.  We recommend signing up for the patient portal called "MyChart".  Sign up information is provided on this After Visit Summary.  MyChart is used to connect with patients for Virtual Visits (Telemedicine).  Patients are able to view lab/test results, encounter notes, upcoming appointments, etc.  Non-urgent messages can be sent to your provider as well.   To learn more about what you can do with MyChart, go to [email or web address].    Your next appointment:   12 week(s)  The format for your next appointment:   In Person  Provider:   Berniece Salines, DO   Other Instructions

## 2020-07-16 NOTE — Telephone Encounter (Signed)
Noted. Thanks.

## 2020-07-17 ENCOUNTER — Encounter: Payer: Self-pay | Admitting: Gastroenterology

## 2020-07-17 ENCOUNTER — Ambulatory Visit: Payer: BC Managed Care – PPO | Admitting: Gastroenterology

## 2020-07-17 ENCOUNTER — Other Ambulatory Visit: Payer: Self-pay | Admitting: Gastroenterology

## 2020-07-17 VITALS — BP 120/76 | HR 80 | Ht 67.0 in | Wt 195.1 lb

## 2020-07-17 DIAGNOSIS — K5903 Drug induced constipation: Secondary | ICD-10-CM

## 2020-07-17 DIAGNOSIS — Z86010 Personal history of colonic polyps: Secondary | ICD-10-CM | POA: Diagnosis not present

## 2020-07-17 DIAGNOSIS — K594 Anal spasm: Secondary | ICD-10-CM | POA: Diagnosis not present

## 2020-07-17 DIAGNOSIS — K610 Anal abscess: Secondary | ICD-10-CM | POA: Diagnosis not present

## 2020-07-17 LAB — CBC
HCT: 48.3 % (ref 39.0–52.0)
Hemoglobin: 16.6 g/dL (ref 13.0–17.0)
MCHC: 34.3 g/dL (ref 30.0–36.0)
MCV: 91.9 fl (ref 78.0–100.0)
Platelets: 297 10*3/uL (ref 150.0–400.0)
RBC: 5.26 Mil/uL (ref 4.22–5.81)
RDW: 13 % (ref 11.5–15.5)
WBC: 5.5 10*3/uL (ref 4.0–10.5)

## 2020-07-17 MED ORDER — AMBULATORY NON FORMULARY MEDICATION: 0 refills | Status: DC

## 2020-07-17 NOTE — Patient Instructions (Signed)
If you are age 51 or older, your body mass index should be between 23-30. Your Body mass index is 30.56 kg/m. If this is out of the aforementioned range listed, please consider follow up with your Primary Care Provider.  If you are age 33 or younger, your body mass index should be between 19-25. Your Body mass index is 30.56 kg/m. If this is out of the aformentioned range listed, please consider follow up with your Primary Care Provider.   Take Miralax 1 capful mixed in 8 ounces of water at bed time for constipation as tolerated.   Due to recent changes in healthcare laws, you may see the results of your imaging and laboratory studies on MyChart before your provider has had a chance to review them.  We understand that in some cases there may be results that are confusing or concerning to you. Not all laboratory results come back in the same time frame and the provider may be waiting for multiple results in order to interpret others.  Please give [place] 48 hours in order for your provider to thoroughly review all the results before contacting the office for clarification of your results.   Thank you for choosing me and [facility].  Vito Cirigliano, D.O.

## 2020-07-17 NOTE — Telephone Encounter (Signed)
We have sent a prescription for nitroglycerin 0.125% gel to [facility]. You should apply a pea size amount to your rectum three times daily x 6-8 weeks.  [facility]'s information is below: Address: [address] [place], [place] [address]  [phone]  *Please DO NOT go directly from our office to pick up this medication! Give the pharmacy 1 day to process the prescription as this is compounded and takes time to make.  Patient has been notified

## 2020-07-17 NOTE — Progress Notes (Signed)
P  Chief Complaint:    Perianal symptoms, constipation  GI History: 50 year old male with a history of nephrolithiasis s/p lithotripsy 2009, cholecystectomy 2009, appendectomy 2012, migraines, hernia repair (2011, 2013),  initially seen in the GI clinic on 01/24/2020 for CRC screening and evaluation of longstanding history of fluctuating bowel movements, primarily nonbloody diarrhea and post prandial urgency, as long as he can remember.  Does also have urgency to have BM with activity and abdominal palpation.  Has 4-5 BMs/day at baseline.  No nocturnal symptoms.  Stool consistency can be variable.  Intermittent BRBPR 2/2 hemorrhoids. Does have associated abdominal cramping abdominal discomfort, which tends to improve with BM.    -02/2020: Normal ESR, CRP, pancreatic elastase, TTG, IgA  Endoscopic history: -Colonoscopy (2008, [facility]): 3 mm polyp at hepatic flexure removed with cold forceps (path not available). -EGD (02/2020, Dr. Bryan Lemma): Mildly irregular Z-line (path benign), Hill grade 3, mild gastritis, fundic gland polyps, normal duodenum (path benign) -Colonoscopy (02/2020, Dr. Bryan Lemma): 6 mm transverse (SSP), 3 mm sigmoid HP, grade 2 internal hemorrhoids.  Otherwise normal colon (path negative for [facility]).  Normal TI.  Repeat 5 years  CT 02/22/2019: Normal GI tract, normal liver and pancreas.  HPI:     Patient is a 50 y.o. male presenting to the Gastroenterology Clinic for evaluation of acute perianal symptoms.  Last seen by me on 01/24/2020 for colon cancer screening along with evaluation of longstanding GI symptoms as outlined above.  Complete colonoscopy 02/2020 as above.  Had migraine med injection x3 (Aimovig) then developed rectal spasm/cramping. Sxs started 1 week ago. He d/w his Neurologist and trialed Xanax w/ some improvement in spasm. Aimovig d/c-ed and now on Emgality.Tried manual relief of spasm, then hemorrhoid cream, topical benzocaine, Sitz soaks. Pain was about on par  w/ previous kidney stones. Thinks he had an abscess or fistula with "knot" in the right buttock with TTP which ruptured with gray colored mucoid material. Changed dressing Q15 mins then used mupircine topical, dibucaine topical. Sxs now improving, with minimal discharge.   Now able to have BM without straining/pushing, but still with constipation- taking Dulcolax.   Review of systems:     No chest pain, no SOB, no fevers, no urinary sx   Past Medical History:  Diagnosis Date  . Abdominal wall hernia 12/05/2019  . Aches 05/12/2018  . Advance care planning 06/14/2017   Living will d/w pt.  Bethanne Ginger designated if patient were incapacitated.    . ALLERGIC RHINITIS 01/22/2010   Qualifier: Diagnosis of  By: Fuller Plan CMA ([place]), Lugene    . Allergy   . Back pain 12/05/2019  . Chickenpox   . Complication of anesthesia    hard to wake up-does not take much  . Dyskinesia   . FH: CAD (coronary artery disease) 12/05/2019  . FHx: thyroid disease 12/05/2019  . Generalized anxiety disorder 05/31/2010  . Headache(784.0)    Migraine  . History of cholecystectomy   . 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 Inattention 03/11/2011  . Joint pain 08/13/2014  . Medial meniscus tear    left  . Migraine headache 01/22/2010   Qualifier: Diagnosis of  By: Fuller Plan CMA (AAMA), Lugene    . Nasal septal deviation 01/25/2014  . Neck pain 01/05/2019  . Nephrolithiasis   . Other specified abdominal hernia without obstruction or gangrene 02/28/2020  . PANCREATITIS, HX OF 02/01/2010   Qualifier: Diagnosis of  By: Dance CMA ([place]), Kim    .  Renal stones 01/22/2010   Qualifier: Diagnosis of  By: Fuller Plan CMA (AAMA), Lugene    . SHOULDER PAIN, LEFT 01/22/2010   Per [facility]    . Skin lesion 02/07/2017  . Tear of meniscus, medial    left knee  . 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 Ulnar neuropathy    mild symptoms   . Unspecified inflammatory spondylopathy, sacral and sacrococcygeal region [facility]) 12/05/2010    Formatting of this note might be different from the original. Overview:  Per [facility]    Patient's surgical history, family medical history, social history, medications and allergies were all reviewed in Epic    Current Outpatient Medications  Medication Sig Dispense Refill  . ALPRAZolam (XANAX) 1 MG tablet Take 1/4 to 1/2 tablet by mouth daily as needed. 30 tablet 1  . Galcanezumab-gnlm (EMGALITY) 120 MG/ML SOAJ Inject 120 mg into the skin every 28 (twenty-eight) days. 1.12 mL 5  . levocetirizine (XYZAL) 5 MG tablet Take 1 tablet (5 mg total) by mouth every evening. 30 tablet 11  . TRAMADOL HCL PO Take 10 mg by mouth as needed (for back pain).    . UBRELVY 100 MG TABS TAKE ONE TABLET BY MOUTH ONE TIME DAILY AS NEEDED (MAY REPEAT IN 2 HOURS, MAXIMUM 2 TABLETS IN 24 HOURS)     No current facility-administered medications for this visit.    Physical Exam:     BP 120/76   Pulse 80   Ht $R'5\' 7"'yo$  (1.702 m)   Wt 195 lb 2 oz (88.5 kg)   BMI 30.56 kg/m   GENERAL:  Pleasant male in NAD PSYCH: : Cooperative, normal affect NEURO: Alert and oriented x 3, no focal neurologic deficits Rectal exam: Sensation intact and preserved anal wink.  External cutaneous draining site located in the 9 o'clock position (in left lateral) with subtle induration beneath the surface.  Mild TTP.  No active draining.  This was located 1-2 cm from anal verge.  Small grade 2 internal hemorrhoids.  No skin tags. (Chaperone: Lanny Hurst, CMA).    IMPRESSION and PLAN:    1) Perianal abscess 2) Rectal spasm 3) Constipation -Acute onset severe anorectal spasm.  Potentially 2/2 Aimovig (muscle spasm in 2%, severe constipation in 3%) with likely subsequent perianal abscess that eventually ruptured on its own.  Symptoms overall improving.  Aimovig has been discontinued by his Neurologist.  Plan for the following: -Continue Dulcolax with goal of soft stools without straining to have BM -Continue adequate hydration  with at least 64 ounces of water/day -Can trial MiraLAX prn and titrate to soft stools -Continue topical therapy for pain management as currently doing -While this is not a typical perianal fissure, following resolution of acute issues, could consider topical NTG 0.125% x 6 weeks to aid in healing.  Will provide Rx today and can fill if needed  RTC if symptoms not continuing to improve  4) History of colon polyp -Repeat colonoscopy in 5 years (2027) for continued polyp surveillance          [name] ,DO, FACG 07/17/2020, 10:28 AM

## 2020-07-17 NOTE — Telephone Encounter (Signed)
[name] sent an online  message via appointment request portal that states the following:    "Howdy.  I saw blood results...no antibiotics indicated based on our conversation this AM.  As a stand-by, if you would prescribe the nitroG topical, I would be very appreciative should the symptoms re-appear.  I took an [place] IgG 96 test on Feb 6, and received the results this afternoon. Would you benefit from having the report? Or have an opinion about the test that you can share?  Thanks again, [employer]

## 2020-07-18 DIAGNOSIS — R42 Dizziness and giddiness: Secondary | ICD-10-CM | POA: Diagnosis not present

## 2020-07-25 DIAGNOSIS — R030 Elevated blood-pressure reading, without diagnosis of hypertension: Secondary | ICD-10-CM | POA: Diagnosis not present

## 2020-07-25 DIAGNOSIS — M461 Sacroiliitis, not elsewhere classified: Secondary | ICD-10-CM | POA: Diagnosis not present

## 2020-07-25 DIAGNOSIS — R42 Dizziness and giddiness: Secondary | ICD-10-CM | POA: Diagnosis not present

## 2020-07-25 DIAGNOSIS — Z6829 Body mass index (BMI) 29.0-29.9, adult: Secondary | ICD-10-CM | POA: Diagnosis not present

## 2020-07-28 DIAGNOSIS — Z9049 Acquired absence of other specified parts of digestive tract: Secondary | ICD-10-CM | POA: Diagnosis not present

## 2020-07-28 DIAGNOSIS — Z8719 Personal history of other diseases of the digestive system: Secondary | ICD-10-CM | POA: Diagnosis not present

## 2020-07-28 DIAGNOSIS — Z9889 Other specified postprocedural states: Secondary | ICD-10-CM | POA: Diagnosis not present

## 2020-07-28 DIAGNOSIS — N200 Calculus of kidney: Secondary | ICD-10-CM | POA: Diagnosis not present

## 2020-07-31 ENCOUNTER — Ambulatory Visit: Payer: BC Managed Care – PPO | Admitting: Gastroenterology

## 2020-08-01 DIAGNOSIS — R42 Dizziness and giddiness: Secondary | ICD-10-CM | POA: Diagnosis not present

## 2020-09-12 DIAGNOSIS — M25561 Pain in right knee: Secondary | ICD-10-CM | POA: Diagnosis not present

## 2020-09-25 ENCOUNTER — Telehealth: Payer: Self-pay | Admitting: Gastroenterology

## 2020-09-25 NOTE — Telephone Encounter (Signed)
Spoke with patient, he repots that he developed a rectal abscess due to side effect of a medication back in February. He states that he noticed another abscess starting to form a few days ago. He describes as midline, between scrotum and rectum, about the size of a nickel sized marble. Patient states that it hurts to sit and with certain movements. He is running a low grade fever and would like to know if antibiotics can be sent in for him. Patient has been scheduled for a follow up on Friday, 09/28/20 at 10:20 AM. Please advise, thanks.

## 2020-09-25 NOTE — Telephone Encounter (Signed)
Spoke with patient in regards to recommendations, he states that he is very hesitant to go the ED for evaluation and that he has severe white coat syndrome. He feels comfortable with Dr. Bryan Lemma, advised that we do have our consult team at [facility]. Patient states that he will think about going but if not he will keep appointment as scheduled. Again, strongly advised that he go to the ED for evaluation and treatment. Patient verbalized understanding of all information.

## 2020-09-25 NOTE — Telephone Encounter (Signed)
Spoke with patient in regards to recommendations, patient states that his fever actually broke and will keep follow up for Friday.

## 2020-09-25 NOTE — Telephone Encounter (Signed)
If he has an abscess, and particularly if w/ associated fever, he really should be seen in ER for evaluation, labs, possible imaging, surgical eval, and possible I&D based on exam.

## 2020-09-28 ENCOUNTER — Encounter: Payer: Self-pay | Admitting: Gastroenterology

## 2020-09-28 ENCOUNTER — Ambulatory Visit (INDEPENDENT_AMBULATORY_CARE_PROVIDER_SITE_OTHER): Payer: BC Managed Care – PPO | Admitting: Gastroenterology

## 2020-09-28 VITALS — BP 142/86 | Ht 67.0 in | Wt 181.0 lb

## 2020-09-28 DIAGNOSIS — K641 Second degree hemorrhoids: Secondary | ICD-10-CM | POA: Diagnosis not present

## 2020-09-28 DIAGNOSIS — K610 Anal abscess: Secondary | ICD-10-CM | POA: Diagnosis not present

## 2020-09-28 DIAGNOSIS — L02215 Cutaneous abscess of perineum: Secondary | ICD-10-CM | POA: Diagnosis not present

## 2020-09-28 NOTE — Patient Instructions (Addendum)
If you are age 51 or older, your body mass index should be between 23-30. Your Body mass index is 28.35 kg/m. If this is out of the aforementioned range listed, please consider follow up with your Primary Care Provider.  If you are age 59 or younger, your body mass index should be between 19-25. Your Body mass index is 28.35 kg/m. If this is out of the aformentioned range listed, please consider follow up with your Primary Care Provider.   You are scheduled today with [facility], please arrive at 3:15 for a 3:45 appointment. Please bring a picture ID and your insurance card.  Due to recent changes in healthcare laws, you may see the results of your imaging and laboratory studies on MyChart before your provider has had a chance to review them.  We understand that in some cases there may be results that are confusing or concerning to you. Not all laboratory results come back in the same time frame and the provider may be waiting for multiple results in order to interpret others.  Please give [place] 48 hours in order for your provider to thoroughly review all the results before contacting the office for clarification of your results.   Thank you for choosing me and [facility].  Vito Cirigliano, D.O.

## 2020-09-28 NOTE — Progress Notes (Signed)
P  Chief Complaint:    Possible perirectal abscess  GI History: 50 year old male with a history of nephrolithiasiss/plithotripsy 2009, cholecystectomy 2009, appendectomy 2012, migraines, hernia repair (2011, 2013), initially seen in the GI clinic on 01/24/2020 for CRC screening and evaluation of longstanding history of fluctuating bowel movements, primarily nonbloody diarrhea and post prandial urgency, as long as he can remember. Does also have urgency to have BM with activity and abdominal palpation. Has 4-5 BMs/day at baseline. No nocturnal symptoms. Stool consistency can be variable. Intermittent BRBPR 2/2 hemorrhoids. Does have associated abdominal cramping abdominal discomfort, which tends to improve with BM.   -02/2020: Normal ESR, CRP, pancreatic elastase, TTG, IgA  Endoscopic history: -Colonoscopy (2008, [facility]): 3 mm polyp at hepatic flexure removed with cold forceps (path not available). -EGD (02/2020, Dr. Bryan Lemma): Mildly irregular Z-line (path benign), Hill grade 3, mild gastritis, fundic gland polyps, normal duodenum (path benign) -Colonoscopy (02/2020, Dr. Bryan Lemma): 6 mm transverse (SSP), 3 mm sigmoid HP, grade 2 internal hemorrhoids.  Otherwise normal colon (path negative for [facility]).  Normal TI.  Repeat 5 years  CT 02/22/2019: Normal GI tract, normal liver and pancreas.  HPI:     Patient is a 50 y.o. male presenting to the Gastroenterology Clinic for evaluation of possible perirectal abscess.  Symptoms started about 6 days ago, described as significant perirectal pain.  Unresponsive to topical conservative measures.  Last seen in the office on 07/17/2020 with similar symptoms.  Rectal exam at that time external cutaneous draining abscess located in the 9 o'clock position with subtle induration mild TTP.  This was treated with conservative management as the abscess had already opened and drained, and symptoms resolved until recent recurrence.   Review of systems:      No chest pain, no SOB, no fevers, no urinary sx   Past Medical History:  Diagnosis Date  . Abdominal wall hernia 12/05/2019  . Aches 05/12/2018  . Advance care planning 06/14/2017   Living will d/w pt.  Bethanne Ginger designated if patient were incapacitated.    . ALLERGIC RHINITIS 01/22/2010   Qualifier: Diagnosis of  By: Fuller Plan CMA ([place]), Lugene    . Allergy   . Back pain 12/05/2019  . Chickenpox   . Complication of anesthesia    hard to wake up-does not take much  . Dyskinesia   . FH: CAD (coronary artery disease) 12/05/2019  . FHx: thyroid disease 12/05/2019  . Generalized anxiety disorder 05/31/2010  . Headache(784.0)    Migraine  . History of cholecystectomy   . 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 Inattention 03/11/2011  . Joint pain 08/13/2014  . Medial meniscus tear    left  . Migraine headache 01/22/2010   Qualifier: Diagnosis of  By: Fuller Plan CMA (AAMA), Lugene    . Nasal septal deviation 01/25/2014  . Neck pain 01/05/2019  . Nephrolithiasis   . Other specified abdominal hernia without obstruction or gangrene 02/28/2020  . PANCREATITIS, HX OF 02/01/2010   Qualifier: Diagnosis of  By: Dance CMA ([place]), Kim    . Renal stones 01/22/2010   Qualifier: Diagnosis of  By: Fuller Plan CMA (AAMA), Lugene    . SHOULDER PAIN, LEFT 01/22/2010   Per [facility]    . Skin lesion 02/07/2017  . Tear of meniscus, medial    left knee  . 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 Ulnar neuropathy    mild symptoms   . Unspecified inflammatory spondylopathy, sacral and sacrococcygeal  region [facility]) 12/05/2010   Formatting of this note might be different from the original. Overview:  Per [facility]    Patient's surgical history, family medical history, social history, medications and allergies were all reviewed in Epic    Current Outpatient Medications  Medication Sig Dispense Refill  . ALPRAZolam (XANAX) 1 MG tablet Take 1/4 to 1/2 tablet by mouth daily as needed. 30 tablet 1  . AMBULATORY  NON FORMULARY MEDICATION Medication Name: Nitroglycerine ointment 0.125 %  Apply a pea sized amount internally four times daily. Dispense 30 GM zero refill 30 g 0  . Galcanezumab-gnlm (EMGALITY) 120 MG/ML SOAJ Inject 120 mg into the skin every 28 (twenty-eight) days. 1.12 mL 5  . levocetirizine (XYZAL) 5 MG tablet Take 1 tablet (5 mg total) by mouth every evening. 30 tablet 11  . TRAMADOL HCL PO Take 10 mg by mouth as needed (for back pain).    . UBRELVY 100 MG TABS TAKE ONE TABLET BY MOUTH ONE TIME DAILY AS NEEDED (MAY REPEAT IN 2 HOURS, MAXIMUM 2 TABLETS IN 24 HOURS)     No current facility-administered medications for this visit.    Physical Exam:     BP (!) 142/86   Ht $R'5\' 7"'fj$  (1.702 m)   Wt 181 lb (82.1 kg)   BMI 28.35 kg/m   GENERAL:  Pleasant male in NAD PSYCH: : Cooperative, normal affect NEURO: Alert and oriented x 3, no focal neurologic deficits Rectal exam: With patient in left lateral position, small abscess with active draining in the 9 o'clock position and a larger abscess in the 3 o'clock position.  Exquisite TTP at both locations.  Grade 2 hemorrhoids. (Chaperone: Lanny Hurst, CMA).     IMPRESSION and PLAN:    1) Perirectal abscess: - Referral to [facility] for same day evaluation for likely I&D - If unable to get in at [facility], I again recommended going to ER for expedited definitive management - May need MRI pelvis or CT pelvis pending surgical evaluation  2) History of colon polyps 3) Internal hemorrhoids - Repeat colonoscopy 2026 for ongoing polyp surveillance      [name] ,DO, FACG 09/28/2020, 10:33 AM

## 2020-10-15 ENCOUNTER — Ambulatory Visit: Payer: BC Managed Care – PPO | Admitting: Cardiology

## 2020-10-16 ENCOUNTER — Encounter: Payer: Self-pay | Admitting: Family Medicine

## 2020-10-16 ENCOUNTER — Other Ambulatory Visit: Payer: Self-pay

## 2020-10-16 ENCOUNTER — Ambulatory Visit: Payer: BC Managed Care – PPO | Admitting: Family Medicine

## 2020-10-16 DIAGNOSIS — M549 Dorsalgia, unspecified: Secondary | ICD-10-CM

## 2020-10-16 MED ORDER — PREDNISONE 10 MG PO TABS: ORAL_TABLET | ORAL | 0 refills | Status: DC

## 2020-10-16 NOTE — Patient Instructions (Addendum)
Start prednisone with food.  Check with Dr. Brien Few.   Please update me as needed.  I'll await the update from Dr. Dema Severin.  Take care.  Glad to see you.

## 2020-10-16 NOTE — Progress Notes (Signed)
This visit occurred during the SARS-CoV-2 public health emergency.  Safety protocols were in place, including screening questions prior to the visit, additional usage of staff PPE, and extensive cleaning of exam room while observing appropriate contact time as indicated for disinfecting solutions.  He had f/u with Dr. Bryan Lemma with f/u with Dr. Dema Severin pending. Still with rectal pain.  He had have flatus passing through the lesion near his rectum, raising concern for fistula. Some occ drainage from the lesion.  No fevers in two weeks.    Back pain.  He had seen Dr. Courtney Paris in the past.  Prev injection done, with relief at the time.  Return of lower back pain in the meantime.  Pain near R SI/lower back area.    He had neurology f/u prev.  He had slower GI transit with aimovig with initial dose, then GI spasms about 15 min after repeat dose.  Weight loss noted in the interval.    He saw PT about vertigo.  PT and massage therapy helped.    Meds, vitals, and allergies reviewed.   ROS: Per HPI unless specifically indicated in ROS section   nad ncat Neck supple, no LA rrr ctab Abdomen soft, not tender. Tight hamstrings bilaterally but SLR neg bilaterally.   R lower back ttp w/o rash.  No midline pain. No gross blood or mass seen on rectal exam.   Small opening superior to the rectum w/o drainage.  No erythema.

## 2020-10-17 NOTE — Assessment & Plan Note (Signed)
We talked about options. Start prednisone with food.  Routine steroid cautions given to patient I asked him to please check with Dr. Brien Few.   He will update me as needed.   About his rectal stump -I'll await the update from Dr. Dema Severin.  Patient agrees with plan.

## 2020-10-23 DIAGNOSIS — R519 Headache, unspecified: Secondary | ICD-10-CM | POA: Diagnosis not present

## 2020-10-23 DIAGNOSIS — K603 Anal fistula: Secondary | ICD-10-CM | POA: Diagnosis not present

## 2020-10-23 DIAGNOSIS — K602 Anal fissure, unspecified: Secondary | ICD-10-CM | POA: Diagnosis not present

## 2020-10-26 ENCOUNTER — Ambulatory Visit: Payer: BC Managed Care – PPO | Admitting: Family Medicine

## 2020-10-26 ENCOUNTER — Other Ambulatory Visit: Payer: Self-pay

## 2020-10-26 ENCOUNTER — Encounter: Payer: Self-pay | Admitting: Family Medicine

## 2020-10-26 VITALS — BP 134/82 | HR 115 | Temp 98.1°F | Ht 67.0 in | Wt 178.0 lb

## 2020-10-26 DIAGNOSIS — R300 Dysuria: Secondary | ICD-10-CM | POA: Diagnosis not present

## 2020-10-26 DIAGNOSIS — K6289 Other specified diseases of anus and rectum: Secondary | ICD-10-CM | POA: Diagnosis not present

## 2020-10-26 LAB — URINALYSIS, ROUTINE W REFLEX MICROSCOPIC
Bilirubin Urine: NEGATIVE
Hgb urine dipstick: NEGATIVE
Ketones, ur: NEGATIVE
Leukocytes,Ua: NEGATIVE
Nitrite: NEGATIVE
Specific Gravity, Urine: 1.02 (ref 1.000–1.030)
Total Protein, Urine: NEGATIVE
Urine Glucose: NEGATIVE
Urobilinogen, UA: 0.2 (ref 0.0–1.0)
pH: 5.5 (ref 5.0–8.0)

## 2020-10-26 MED ORDER — PREDNISONE 10 MG PO TABS: ORAL_TABLET | ORAL | 0 refills | Status: DC

## 2020-10-26 MED ORDER — ALPRAZOLAM 1 MG PO TABS: ORAL_TABLET | ORAL | 1 refills | Status: DC

## 2020-10-26 NOTE — Patient Instructions (Signed)
We'll update your about your urine results.  Restart prednisone and see GI in the meantime.  Take care.  Glad to see you.

## 2020-10-26 NOTE — Progress Notes (Signed)
This visit occurred during the SARS-CoV-2 public health emergency.  Safety protocols were in place, including screening questions prior to the visit, additional usage of staff PPE, and extensive cleaning of exam room while observing appropriate contact time as indicated for disinfecting solutions.  He has progressive discomfort/possible knot anterior to rectum.  He had prev concern re: fistula/fissure, but this is a separate issue.    He had prev trouble with constipation and hemorrhoids.  He also had trouble with GI spasm.   Prev pain improved with oral steroids, regressed in the meantime.  He has perineal pain deep to the superficial skin.  He hasn't been able to get nifedipine topical in the meantime.  He has used topical nitroglycerin but with inc in HA.  More pain with sitting.  No blood in stool.  Some dysuria/occ burning with urination noted.    D/w pt about anxiety with prn xanax use, no ADE on med.  rx sent.   Meds, vitals, and allergies reviewed.   ROS: Per HPI unless specifically indicated in ROS section   nad ncat Neck supple, no LA Rrr, Mildly tachy on exam, but regular.   ctab Prostate not ttp.  No abscess or infection seen on the perineum. No gross blood per rectum.  Possible fistula site noted superior to the rectum

## 2020-10-27 LAB — URINE CULTURE
MICRO NUMBER:: 11943790
Result:: NO GROWTH
SPECIMEN QUALITY:: ADEQUATE

## 2020-10-29 DIAGNOSIS — K6289 Other specified diseases of anus and rectum: Secondary | ICD-10-CM | POA: Insufficient documentation

## 2020-10-29 NOTE — Assessment & Plan Note (Signed)
Prostate not tender, but reasonable to check urine culture and urinalysis.  See notes on labs.  He is going to follow-up with GI in the meantime.  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  That improved with a prednisone course and I do not know if he has pudendal nerve (or branch thereof) irritation.  Restart prednisone with routine cautions and follow-up with GI.  I do not see any sign of prostatitis or active infection otherwise.  He agrees with plan.

## 2020-10-30 DIAGNOSIS — K610 Anal abscess: Secondary | ICD-10-CM | POA: Diagnosis not present

## 2020-10-30 DIAGNOSIS — K6289 Other specified diseases of anus and rectum: Secondary | ICD-10-CM | POA: Diagnosis not present

## 2020-10-31 ENCOUNTER — Ambulatory Visit: Payer: BC Managed Care – PPO | Admitting: Gastroenterology

## 2020-11-05 ENCOUNTER — Other Ambulatory Visit: Payer: Self-pay

## 2020-11-05 ENCOUNTER — Ambulatory Visit: Payer: BC Managed Care – PPO | Admitting: Cardiology

## 2020-11-05 ENCOUNTER — Encounter: Payer: Self-pay | Admitting: Cardiology

## 2020-11-05 VITALS — BP 132/76 | HR 90 | Ht 67.0 in | Wt 176.0 lb

## 2020-11-05 DIAGNOSIS — R030 Elevated blood-pressure reading, without diagnosis of hypertension: Secondary | ICD-10-CM | POA: Diagnosis not present

## 2020-11-05 DIAGNOSIS — I5189 Other ill-defined heart diseases: Secondary | ICD-10-CM

## 2020-11-05 NOTE — Progress Notes (Signed)
Cardiology Office Note:    Date:  11/05/2020   ID:  [name], DOB Mar 12, 1971, MRN [id number]  PCP:  Tonia Ghent, MD  Cardiologist:  Berniece Salines, DO  Electrophysiologist:  None   Referring MD: Tonia Ghent, MD   Still having some pain from my abscess  History of Present Illness:    [name] is a 50 y.o. male with a hx of anxiety, migraine headaches is here today for follow-up visit.    I did see the patient in October 2021 at that time due to his symptoms of premature coronary artery disease in his father I recommended he undergo coronary CTA as well as monitored for his palpitations and dizziness and shortness of breath.  In the interim he was able to get his testing done which did not show any evidence of coronary artery disease and nothing remarkable.  I was able to share this with the patient.  I saw the patient on July 16, 2020 at that time he was experiencing some pain and was undergoing consultation for an abscess.  When I visit his blood pressure was elevated, I spoke the patient about ways in which he could manage his blood pressure but most of this was dependent on his pain control.   Since I saw the patient he has been still going through treatment for his abscess.  Here for no complaints from a cardiovascular standpoint today.  Past Medical History:  Diagnosis Date  . Abdominal wall hernia 12/05/2019  . Aches 05/12/2018  . Advance care planning 06/14/2017   Living will d/w pt.  Bethanne Ginger designated if patient were incapacitated.    . ALLERGIC RHINITIS 01/22/2010   Qualifier: Diagnosis of  By: Fuller Plan CMA ([place]), [place]    . Allergy   . Back pain 12/05/2019  . Chickenpox   . Complication of anesthesia    hard to wake up-does not take much  . Dyskinesia   . FH: CAD (coronary artery disease) 12/05/2019  . FHx: thyroid disease 12/05/2019  . Generalized anxiety disorder 05/31/2010  . Headache(784.0)    Migraine  . History of cholecystectomy   .  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 Inattention 03/11/2011  . Joint pain 08/13/2014  . Medial meniscus tear    left  . Migraine headache 01/22/2010   Qualifier: Diagnosis of  By: Fuller Plan CMA (AAMA), [place]    . Nasal septal deviation 01/25/2014  . Neck pain 01/05/2019  . Nephrolithiasis   . Other specified abdominal hernia without obstruction or gangrene 02/28/2020  . PANCREATITIS, HX OF 02/01/2010   Qualifier: Diagnosis of  By: Dance CMA ([place]), Kim    . Renal stones 01/22/2010   Qualifier: Diagnosis of  By: Fuller Plan CMA (AAMA), Lug[place]    . SHOULDER PAIN, LEFT 01/22/2010   Per [facility]    . Skin lesion 02/07/2017  . Tear of meniscus, medial    left knee  . 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 Ulnar neuropathy    mild symptoms   . Unspecified inflammatory spondylopathy, sacral and sacrococcygeal region [facility]) 12/05/2010   Formatting of this note might be different from the original. Overview:  Per [facility]    Past Surgical History:  Procedure Laterality Date  . ABDOMINAL SURGERY  04/2020   linea alba surgery. Dr [facility]   . APPENDECTOMY  2012   lap append  . CHOLECYSTECTOMY  2009  . CLOSED REDUCTION CLAVICLE  FRACTURE  1993   left  . COLONOSCOPY  2008  . ESOPHAGOGASTRODUODENOSCOPY     [place]   . GALLBLADDER SURGERY  2008  . HERNIA REPAIR  2011   repair from gallbladder surgery  . HERNIA REPAIR  [id number]   umbilical  . LITHOTRIPSY  2000  . mesh surgery  2011   abdominal  . NASAL SEPTOPLASTY W/ TURBINOPLASTY Bilateral 01/25/2014   Procedure: NASAL SEPTOPLASTY/BILATERAL INFERIOR TURBINATE REDUCTION;  Surgeon: Jerrell Belfast, MD;  Location: [facility];  Service: ENT;  Laterality: Bilateral;  . WISDOM TOOTH EXTRACTION      Current Medications: Current Meds  Medication Sig  . ALPRAZolam (XANAX) 1 MG tablet Take 1/4 to 1/2 tablet by mouth daily as needed.  . AMBULATORY NON FORMULARY MEDICATION Medication Name:  Nitroglycerine ointment 0.125 %  Apply a pea sized amount internally four times daily. Dispense 30 GM zero refill  . levocetirizine (XYZAL) 5 MG tablet Take 1 tablet (5 mg total) by mouth every evening.  . predniSONE (DELTASONE) 10 MG tablet Take 2 a day for 5 days, then 1 a day for 5 days, with food. Don't take with aleve/ibuprofen.  . TRAMADOL HCL PO Take 10 mg by mouth as needed (for back pain).  . UBRELVY 100 MG TABS TAKE ONE TABLET BY MOUTH ONE TIME DAILY AS NEEDED (MAY REPEAT IN 2 HOURS, MAXIMUM 2 TABLETS IN 24 HOURS)     Allergies:   Minocycline, Morphine, Pseudoephedrine, Topiramate, Aimovig [erenumab-aooe], Cephalexin, Citalopram hydrobromide, Cyclobenzaprine hcl, Levaquin [levofloxacin], Sulfonamide derivatives, Sumatriptan, Codeine, and Venlafaxine   Social History   Socioeconomic History  . Marital status: Married    Spouse name: Not on file  . Number of children: Not on file  . Years of education: Not on file  . Highest education level: Not on file  Occupational History  . Occupation: Corporate treasurer, freelance work    Fish farm manager: Therapist, occupational [employer]  . Occupation: PTA  Tobacco Use  . Smoking status: Never Smoker  . Smokeless tobacco: Never Used  Vaping Use  . Vaping Use: Never used  Substance and Sexual Activity  . Alcohol use: Yes    Alcohol/week: 0.0 standard drinks    Comment: 1 glass of wine/month  . Drug use: No  . Sexual activity: Not on file  Other Topics Concern  . Not on file  Social History Narrative   Long term relationship    Regular exercise- yes   Doing relief/PRN PT work.   Social Determinants of Health   Financial Resource Strain: Not on file  Food Insecurity: Not on file  Transportation Needs: Not on file  Physical Activity: Not on file  Stress: Not on file  Social Connections: Not on file     Family History: The patient's family history includes Cancer in his mother; Esophageal varices in his mother; Heart disease in his father;  Liver disease in his mother. There is no history of Colon cancer, Prostate cancer, Esophageal cancer, Rectal cancer, or Stomach cancer.  ROS:   Review of Systems  Constitution: Negative for decreased appetite, fever and weight gain.  HENT: Negative for congestion, ear discharge, hoarse voice and sore throat.   Eyes: Negative for discharge, redness, vision loss in right eye and visual halos.  Cardiovascular: Negative for chest pain, dyspnea on exertion, leg swelling, orthopnea and palpitations.  Respiratory: Negative for cough, hemoptysis, shortness of breath and snoring.   Endocrine: Negative for heat intolerance and polyphagia.  Hematologic/Lymphatic: Negative for  bleeding problem. Does not bruise/bleed easily.  Skin: Negative for flushing, nail changes, rash and suspicious lesions.  Musculoskeletal: Negative for arthritis, joint pain, muscle cramps, myalgias, neck pain and stiffness.  Gastrointestinal: Negative for abdominal pain, bowel incontinence, diarrhea and excessive appetite.  Genitourinary: Negative for decreased libido, genital sores and incomplete emptying.  Neurological: Negative for brief paralysis, focal weakness, headaches and loss of balance.  Psychiatric/Behavioral: Negative for altered mental status, depression and suicidal ideas.  Allergic/Immunologic: Negative for HIV exposure and persistent infections.    EKGs/Labs/Other Studies Reviewed:    The following studies were reviewed today:   EKG:  The ekg ordered today demonstrates    Coronary CTA October 2021 Coronary Arteries: Normal coronary origin. Right dominance.  RCA is a large dominant artery that gives rise to PDA and PLVB. There is no plaque.  Left main is a large artery that gives rise to LAD and LCX arteries.  LAD is a large vessel that has no plaque. D1 with no plaques. There is mild myocardial bridging in a small portion of the mid LAD.  LCX is a non-dominant artery that gives rise to one  large OM1 branch. There is no plaque.  Other findings:  Normal pulmonary vein drainage into the left atrium.  Normal left atrial appendage without a thrombus.  Normal size of the pulmonary artery.  IMPRESSION: 1. Coronary calcium score of 0. This was 0 percentile for age and sex matched control.  2. Normal coronary origin with right dominance.  3. No evidence of CAD.  ZIO monitor The patient wore the monitor for 11 days starting March 05, 2020 . Indication: Dizziness The minimum heart rate was 42 bpm, maximum heart rate was 74 bpm, and average heart rate was 159 bpm. Predominant underlying rhythm was Sinus Rhythm.  Premature atrial complexes were rare less than 1%. Premature Ventricular complexes rare less than 1%. No ventricular tachycardia, no pauses, No AV block, no supraventricular tachycardia and no atrial fibrillation present. 24 patient triggered events and 15 diary events are associated with sinus rhythm.   Conclusion: Unremarkable/normal study with no evidence of significant arrhythmia.   Transthoracic echocardiogramIMPRESSIONS October 2021 1. Left ventricular ejection fraction, by estimation, is 60 to 65%. The  left ventricle has normal function. The left ventricle has no regional  wall motion abnormalities. Left ventricular diastolic parameters are  consistent with Grade I diastolic  dysfunction (impaired relaxation).  2. Right ventricular systolic function is normal. The right ventricular  size is normal.  3. The mitral valve is normal in structure. No evidence of mitral valve  regurgitation. No evidence of mitral stenosis.  4. The aortic valve is tricuspid. Aortic valve regurgitation is not  visualized. No aortic stenosis is present.  5. The inferior vena cava is normal in size with greater than 50%  respiratory variability, suggesting right atrial pressure of 3 mmHg.   Recent Labs: 12/02/2019: ALT 28; TSH 3.22 03/21/2020: BUN 9;  Creatinine, Ser 0.98; Potassium 4.1; Sodium 143 07/16/2020: Hemoglobin 16.6; Platelets 297.0  Recent Lipid Panel    Component Value Date/Time   CHOL 149 12/02/2019 1152   TRIG 139.0 12/02/2019 1152   HDL 32.30 (L) 12/02/2019 1152   CHOLHDL 5 12/02/2019 1152   VLDL 27.8 12/02/2019 1152   LDLCALC 89 12/02/2019 1152    Physical Exam:    VS:  BP 132/76   Pulse 90   Ht 5\' 7"  (1.702 m)   Wt 176 lb (79.8 kg)   SpO2 98%   BMI  27.57 kg/m     Wt Readings from Last 3 Encounters:  11/05/20 176 lb (79.8 kg)  10/26/20 178 lb (80.7 kg)  10/16/20 178 lb (80.7 kg)     GEN: Well nourished, well developed in no acute distress HEENT: Normal NECK: No JVD; No carotid bruits LYMPHATICS: No lymphadenopathy CARDIAC: S1S2 noted,RRR, no murmurs, rubs, gallops RESPIRATORY:  Clear to auscultation without rales, wheezing or rhonchi  ABDOMEN: Soft, non-tender, non-distended, +bowel sounds, no guarding. EXTREMITIES: No edema, No cyanosis, no clubbing MUSCULOSKELETAL:  No deformity  SKIN: Warm and dry NEUROLOGIC:  Alert and oriented x 3, non-focal PSYCHIATRIC:  Normal affect, good insight  ASSESSMENT:    1. Elevated blood pressure reading   2. Grade I diastolic dysfunction    PLAN:     His blood pressure was manually done by me 132/76 mmHg.  We talked about continue to monitor his blood pressure given his family history.  He also has lost some weight I congratulated patient.  He is following up with the process for his treatment of his perineal abscess.   The patient is in agreement with the above plan. The patient left the office in stable condition.  The patient will follow up in 1 year or sooner if needed.   Medication Adjustments/Labs and Tests Ordered: Current medicines are reviewed at length with the patient today.  Concerns regarding medicines are outlined above.  No orders of the defined types were placed in this encounter.  No orders of the defined types were placed in this  encounter.   There are no Patient Instructions on file for this visit.   Adopting a Healthy Lifestyle.  Know what a healthy weight is for you (roughly BMI <25) and aim to maintain this   Aim for 7+ servings of fruits and vegetables daily   65-80+ fluid ounces of water or unsweet tea for healthy kidneys   Limit to max 1 drink of alcohol per day; avoid smoking/tobacco   Limit animal fats in diet for cholesterol and heart health - choose grass fed whenever available   Avoid highly processed foods, and foods high in saturated/trans fats   Aim for low stress - take time to unwind and care for your mental health   Aim for 150 min of moderate intensity exercise weekly for heart health, and weights twice weekly for bone health   Aim for 7-9 hours of sleep daily   When it comes to diets, agreement about the perfect plan isnt easy to find, even among the experts. Experts at the [employer] developed an idea known as the Healthy Eating Plate. Just imagine a plate divided into logical, healthy portions.   The emphasis is on diet quality:   Load up on vegetables and fruits - one-half of your plate: Aim for color and variety, and remember that potatoes dont count.   Go for whole grains - one-quarter of your plate: Whole wheat, barley, wheat berries, quinoa, oats, brown rice, and foods made with them. If you want pasta, go with whole wheat pasta.   Protein power - one-quarter of your plate: Fish, chicken, beans, and nuts are all healthy, versatile protein sources. Limit red meat.   The diet, however, does go beyond the plate, offering a few other suggestions.   Use healthy plant oils, such as olive, canola, soy, corn, sunflower and peanut. Check the labels, and avoid partially hydrogenated oil, which have unhealthy trans fats.   If youre thirsty, drink water. Coffee  and tea are good in moderation, but skip sugary drinks and limit milk and dairy products to one or two  daily servings.   The type of carbohydrate in the diet is more important than the amount. Some sources of carbohydrates, such as vegetables, fruits, whole grains, and beans-are healthier than others.   Finally, stay active  Signed, Berniece Salines, DO  11/05/2020 2:32 PM    [facility]

## 2020-11-05 NOTE — Patient Instructions (Signed)

## 2020-11-08 DIAGNOSIS — K603 Anal fistula: Secondary | ICD-10-CM | POA: Diagnosis not present

## 2020-11-08 DIAGNOSIS — M461 Sacroiliitis, not elsewhere classified: Secondary | ICD-10-CM | POA: Diagnosis not present

## 2020-11-08 DIAGNOSIS — K610 Anal abscess: Secondary | ICD-10-CM | POA: Diagnosis not present

## 2020-11-08 DIAGNOSIS — Z6830 Body mass index (BMI) 30.0-30.9, adult: Secondary | ICD-10-CM | POA: Diagnosis not present

## 2020-11-08 DIAGNOSIS — R030 Elevated blood-pressure reading, without diagnosis of hypertension: Secondary | ICD-10-CM | POA: Diagnosis not present

## 2020-11-09 DIAGNOSIS — K602 Anal fissure, unspecified: Secondary | ICD-10-CM | POA: Diagnosis not present

## 2020-11-09 DIAGNOSIS — K603 Anal fistula: Secondary | ICD-10-CM | POA: Diagnosis not present

## 2020-11-26 ENCOUNTER — Encounter: Payer: Self-pay | Admitting: Family Medicine

## 2020-11-26 MED ORDER — PREDNISONE 10 MG PO TABS: ORAL_TABLET | ORAL | 0 refills | Status: DC

## 2020-12-04 DIAGNOSIS — Z6830 Body mass index (BMI) 30.0-30.9, adult: Secondary | ICD-10-CM | POA: Diagnosis not present

## 2020-12-04 DIAGNOSIS — M47812 Spondylosis without myelopathy or radiculopathy, cervical region: Secondary | ICD-10-CM | POA: Insufficient documentation

## 2020-12-04 DIAGNOSIS — R030 Elevated blood-pressure reading, without diagnosis of hypertension: Secondary | ICD-10-CM | POA: Diagnosis not present

## 2020-12-07 DIAGNOSIS — M461 Sacroiliitis, not elsewhere classified: Secondary | ICD-10-CM | POA: Diagnosis not present

## 2020-12-11 ENCOUNTER — Encounter (HOSPITAL_BASED_OUTPATIENT_CLINIC_OR_DEPARTMENT_OTHER): Payer: Self-pay | Admitting: Surgery

## 2020-12-11 ENCOUNTER — Other Ambulatory Visit: Payer: Self-pay

## 2020-12-11 DIAGNOSIS — K432 Incisional hernia without obstruction or gangrene: Secondary | ICD-10-CM | POA: Diagnosis not present

## 2020-12-11 DIAGNOSIS — Z973 Presence of spectacles and contact lenses: Secondary | ICD-10-CM

## 2020-12-11 HISTORY — DX: Presence of spectacles and contact lenses: Z97.3

## 2020-12-11 NOTE — Progress Notes (Signed)
Spoke w/ via phone for pre-op interview---PT Lab needs dos---- [place] test -----patient states asymptomatic no test needed Arrive at -------1045 NPO after MN NO Solid Food.  Clear liquids from MN until--- Med rec completed Medications to take morning of surgery ----- Diabetic medication ----NA- Patient instructed no nail polish to be worn day of surgery  Patient instructed to bring photo id and insurance card day of surgery Patient aware to have Driver (ride ) / [name] caregiver    for 24 hours after surgery  Patient Special Instructions ----- NONE Pre-Op special Istructions -----NONE Patient verbalized understanding of instructions that were given at this phone interview. Patient denies shortness of breath, chest pain, fever, cough at this phone interview.   ORDERS REQUESTED OF DR. WHITE Epic IB

## 2020-12-12 ENCOUNTER — Ambulatory Visit: Payer: Self-pay | Admitting: Surgery

## 2020-12-12 NOTE — H&P (Signed)
CC: Referred to see me for possible anal fistula  HPI: [name] is a very pleasant 50yoM with hx of headaches, developing ascribes to be significant perianal pain that was knifelike and sensation with pelvic floor muscle spasm associated bowel movements.  This all began in February of this year.  Subsequent to this, he developed issues with significant perianal swelling that ultimately resulted in some perianal drainage of purulent like fluid.  This all began to improve over time but he developed recurrent perianal abscess and had an incision/drainage procedure done in the office here.  He follows with GI, [name], and has been prescribed topical nitroglycerin. This has exacerbated his headaches.  His most recent colonoscopy was completed in September, 2021 and he had a few small polyps removed. Random biopsies did not show evidence of microscopic colitis and IBD.  PMH: Headaches  PSH: Perianal I&D; lap chole, lap appy (2012 - path showed acute and chronic appendicitis), hernia repair with mesh  FHx: Denies FHx of colorectal, breast, endometrial, ovarian or cervical cancer  Social: Denies use of tobacco/EtOH/drugs. Previously worked as Paramedic  ROS: A comprehensive 10 system review of systems was completed with the patient and pertinent findings as noted above.  The patient is a 50 year old male.   Past Surgical History [name], [name]; 10/23/2020 11:26 AM) Appendectomy   Gallbladder Surgery - Laparoscopic   Oral Surgery   Ventral / Umbilical Hernia Surgery   multiple  Diagnostic Studies History [name], [name]; 10/23/2020 11:26 AM) Colonoscopy   within last year  Allergies [name], CMA; 10/23/2020 11:09 AM) Sulfa 10 *OPHTHALMIC AGENTS*   Keflex *CEPHALOSPORINS*   Cipro *FLUOROQUINOLONES*   Levicyn *DERMATOLOGICALS*   Allergies Reconciled    Medication History [name], CMA; 10/23/2020 11:09 AM) Xanax  (0.25MG  Tablet, Oral)  Active. Advil  (100MG  Tablet Chewable, Oral) Active. Medications Reconciled   Social History [name], CMA; 10/23/2020 11:26 AM) Alcohol use   Occasional alcohol use. Caffeine use   Tea. No drug use   Tobacco use   Never smoker.  Other Problems [name], [name]; 10/23/2020 11:26 AM) Back Pain   Kidney Stone   Migraine Headache   Umbilical Hernia Repair   Ventral Hernia Repair      Review of Systems [name]; 10/23/2020 11:48 AM) General Not Present- Appetite Loss, Chills, Fatigue, Fever, Night Sweats, Weight Gain and Weight Loss. Skin Present- New Lesions. Not Present- Change in Wart/Mole, Dryness, Hives, Jaundice, Non-Healing Wounds, Rash and Ulcer. HEENT Present- Seasonal Allergies and Wears glasses/contact lenses. Not Present- Earache, Hearing Loss, Hoarseness, Nose Bleed, Oral Ulcers, Ringing in the Ears, Sinus Pain, Sore Throat, Visual Disturbances and Yellow Eyes. Cardiovascular Not Present- Chest Pain and Difficulty Breathing Lying Down. Gastrointestinal Present- Rectal Pain. Not Present- Abdominal Pain, Bloating, Bloody Stool, Change in Bowel Habits, Chronic diarrhea, Constipation, Difficulty Swallowing, Excessive gas, Gets full quickly at meals, Hemorrhoids, Indigestion, Nausea and Vomiting. Neurological Not Present- Decreased Memory, Fainting, Headaches, Numbness, Seizures, Tingling, Tremor, Trouble walking and Weakness. Psychiatric Not Present- Anxiety, Bipolar, Change in Sleep Pattern, Depression, Fearful and Frequent crying. Endocrine Not Present- Cold Intolerance, Excessive Hunger, Hair Changes, Heat Intolerance, Hot flashes and New Diabetes. Hematology Not Present- Blood Thinners, Easy Bruising, Excessive bleeding, Gland problems, HIV and Persistent Infections.   Physical Exam [name]; 10/23/2020 11:49 AM) The physical exam findings are as follows: Note:  Constitutional: No acute distress; conversant; wearing mask Eyes: Moist  conjunctiva; no lid lag; anicteric  sclerae; pupils equal and round Neck: Trachea midline; no palpable thyromegaly Lungs: Normal respiratory effort; no tactile fremitus CV: rrr; no palpable thrill; no pitting edema GI: Abdomen soft, nontender, nondistended; no palpable hepatosplenomegaly Anorectal: Normal appearing perianal skin with exception of external opening posterior midline fairly close to anal os; DRE - increased tone/squeeze. No fluctuance. No erythema. Anoscopy: circumferential anoscopy somewhat limited in ability to perform due to intolerance.  He does appear to have a posterior midline anal fissure.  Anoderm otherwise grossly normal in appearance. 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 The pathophysiology of anal abscess, fistula and fissures was discussed with associated pictures and illustrations using our ASCRS handouts -We reviewed options going forward. For the immediate time, I have recommended he begin taking a daily fiber supplement such as Metamucil or Benefiber-1-2 tablespoons per day. Working to maintain hydration. Continuing to use his commode bidet. -we discussed surgical options. This would include anorectal exam under anesthesia and assessing the fistula. If this is a short tract anal fistula without any significant muscle involvement, the possibility of primary fistulotomy. Deeper, the potential for a seton. If he has evident  anal fissure, concurrently injecting perianal Botox to accomplish a chemical sphincterotomy. -The planned procedures, material risks (including, but not limited to, pain, bleeding, infection, scarring, need for blood transfusion, damage to anal sphincter, incontinence of gas and/or stool, need for additional procedures, recurrence of fissure and/or fistula, pneumonia, heart attack, stroke, death) benefits and alternatives to surgery were discussed at length. We noted a good probability that the procedure would help improve his symptoms. The patient's questions were answered to his satisfaction, he voiced understanding and elected to proceed with surgery. Additionally, we discussed typical postoperative expectations and the recovery process.  This patient encounter took 35 minutes today to perform the following: take history, perform exam, review outside records, interpret imaging, counsel the patient on their diagnosis and document encounter, findings & plan in the EHR  Current Plans  ANOSCOPY, DIAGNOSTIC [phone]) (Anoscopy: circumferential anoscopy somewhat limited in ability to perform due to intolerance. He does appear to have a posterior midline anal fissure. Anoderm otherwise grossly normal in appearance.) Signed by Ileana Roup, Nicholson (10/23/2020 11:53 AM)

## 2020-12-16 MED ORDER — BUPIVACAINE LIPOSOME 1.3 % IJ SUSP: 20.0000 mL | Freq: Once | INTRAMUSCULAR | Status: DC

## 2020-12-17 ENCOUNTER — Ambulatory Visit (HOSPITAL_BASED_OUTPATIENT_CLINIC_OR_DEPARTMENT_OTHER)
Admission: RE | Admit: 2020-12-17 | Discharge: 2020-12-17 | Disposition: A | Discharge status: Home / Self Care | Payer: BC Managed Care – PPO | Attending: Surgery | Admitting: Surgery

## 2020-12-17 ENCOUNTER — Ambulatory Visit (HOSPITAL_BASED_OUTPATIENT_CLINIC_OR_DEPARTMENT_OTHER): Payer: BC Managed Care – PPO | Admitting: Anesthesiology

## 2020-12-17 ENCOUNTER — Encounter (HOSPITAL_BASED_OUTPATIENT_CLINIC_OR_DEPARTMENT_OTHER)
Admission: RE | Disposition: A | Discharge status: Home / Self Care | Payer: Self-pay | Source: Home / Self Care | Attending: Surgery

## 2020-12-17 ENCOUNTER — Other Ambulatory Visit: Payer: Self-pay

## 2020-12-17 ENCOUNTER — Encounter (HOSPITAL_BASED_OUTPATIENT_CLINIC_OR_DEPARTMENT_OTHER): Payer: Self-pay | Admitting: Surgery

## 2020-12-17 DIAGNOSIS — Z881 Allergy status to other antibiotic agents status: Secondary | ICD-10-CM | POA: Insufficient documentation

## 2020-12-17 DIAGNOSIS — K644 Residual hemorrhoidal skin tags: Secondary | ICD-10-CM | POA: Diagnosis not present

## 2020-12-17 DIAGNOSIS — K603 Anal fistula: Secondary | ICD-10-CM | POA: Diagnosis not present

## 2020-12-17 DIAGNOSIS — K602 Anal fissure, unspecified: Secondary | ICD-10-CM | POA: Diagnosis not present

## 2020-12-17 DIAGNOSIS — K648 Other hemorrhoids: Secondary | ICD-10-CM | POA: Insufficient documentation

## 2020-12-17 DIAGNOSIS — K605 Anorectal fistula: Secondary | ICD-10-CM | POA: Insufficient documentation

## 2020-12-17 DIAGNOSIS — F411 Generalized anxiety disorder: Secondary | ICD-10-CM | POA: Diagnosis not present

## 2020-12-17 DIAGNOSIS — Z882 Allergy status to sulfonamides status: Secondary | ICD-10-CM | POA: Insufficient documentation

## 2020-12-17 DIAGNOSIS — Z885 Allergy status to narcotic agent status: Secondary | ICD-10-CM | POA: Insufficient documentation

## 2020-12-17 DIAGNOSIS — Z87442 Personal history of urinary calculi: Secondary | ICD-10-CM | POA: Diagnosis not present

## 2020-12-17 DIAGNOSIS — Z8719 Personal history of other diseases of the digestive system: Secondary | ICD-10-CM | POA: Diagnosis not present

## 2020-12-17 HISTORY — PX: RECTAL EXAM UNDER ANESTHESIA: SHX6399

## 2020-12-17 HISTORY — PX: PLACEMENT OF SETON: SHX6029

## 2020-12-17 HISTORY — PX: ANAL FISTULOTOMY: SHX6423

## 2020-12-17 HISTORY — DX: Personal history of urinary calculi: Z87.442

## 2020-12-17 HISTORY — DX: Elevated blood-pressure reading, without diagnosis of hypertension: R03.0

## 2020-12-17 SURGERY — EXAM UNDER ANESTHESIA, RECTUM
Anesthesia: General | Site: Rectum

## 2020-12-17 MED ORDER — BUPIVACAINE LIPOSOME 1.3 % IJ SUSP
INTRAMUSCULAR | Status: DC | PRN
  Administered 2020-12-17: 20 mL

## 2020-12-17 MED ORDER — LIDOCAINE HCL (PF) 2 % IJ SOLN
INTRAMUSCULAR | Status: AC
  Filled 2020-12-17: qty 5

## 2020-12-17 MED ORDER — MIDAZOLAM HCL 2 MG/2ML IJ SOLN
INTRAMUSCULAR | Status: AC
  Filled 2020-12-17: qty 2

## 2020-12-17 MED ORDER — BUPIVACAINE-EPINEPHRINE 0.25% -1:200000 IJ SOLN
INTRAMUSCULAR | Status: DC | PRN
  Administered 2020-12-17: 30 mL

## 2020-12-17 MED ORDER — TRAMADOL HCL 50 MG PO TABS: 50.0000 mg | ORAL_TABLET | Freq: Four times a day (QID) | ORAL | 0 refills | Status: AC | PRN

## 2020-12-17 MED ORDER — FENTANYL CITRATE (PF) 100 MCG/2ML IJ SOLN
INTRAMUSCULAR | Status: AC
  Filled 2020-12-17: qty 2

## 2020-12-17 MED ORDER — FENTANYL CITRATE (PF) 100 MCG/2ML IJ SOLN
INTRAMUSCULAR | Status: DC | PRN
  Administered 2020-12-17: 50 ug via INTRAVENOUS

## 2020-12-17 MED ORDER — AMISULPRIDE (ANTIEMETIC) 5 MG/2ML IV SOLN: 10.0000 mg | Freq: Once | INTRAVENOUS | Status: DC | PRN

## 2020-12-17 MED ORDER — PROPOFOL 10 MG/ML IV BOLUS
INTRAVENOUS | Status: DC | PRN
  Administered 2020-12-17: 200 mg via INTRAVENOUS

## 2020-12-17 MED ORDER — CHLORHEXIDINE GLUCONATE CLOTH 2 % EX PADS: 6.0000 | MEDICATED_PAD | Freq: Once | CUTANEOUS | Status: DC

## 2020-12-17 MED ORDER — ONDANSETRON HCL 4 MG/2ML IJ SOLN
INTRAMUSCULAR | Status: AC
  Filled 2020-12-17: qty 2

## 2020-12-17 MED ORDER — OXYCODONE HCL 5 MG PO TABS
5.0000 mg | ORAL_TABLET | Freq: Once | ORAL | Status: DC | PRN
Start: 2020-12-17 — End: 2020-12-17

## 2020-12-17 MED ORDER — DEXAMETHASONE SODIUM PHOSPHATE 10 MG/ML IJ SOLN
INTRAMUSCULAR | Status: AC
  Filled 2020-12-17: qty 1

## 2020-12-17 MED ORDER — KETOROLAC TROMETHAMINE 30 MG/ML IJ SOLN
INTRAMUSCULAR | Status: AC
  Filled 2020-12-17: qty 1

## 2020-12-17 MED ORDER — DEXAMETHASONE SODIUM PHOSPHATE 4 MG/ML IJ SOLN
INTRAMUSCULAR | Status: DC | PRN
  Administered 2020-12-17: 10 mg via INTRAVENOUS

## 2020-12-17 MED ORDER — KETOROLAC TROMETHAMINE 30 MG/ML IJ SOLN
30.0000 mg | Freq: Once | INTRAMUSCULAR | Status: AC | PRN
  Administered 2020-12-17: 30 mg via INTRAVENOUS

## 2020-12-17 MED ORDER — ACETAMINOPHEN 500 MG PO TABS
1000.0000 mg | ORAL_TABLET | ORAL | Status: AC
  Administered 2020-12-17: 1000 mg via ORAL

## 2020-12-17 MED ORDER — PROPOFOL 10 MG/ML IV BOLUS
INTRAVENOUS | Status: AC
  Filled 2020-12-17: qty 20

## 2020-12-17 MED ORDER — LIDOCAINE HCL (CARDIAC) PF 100 MG/5ML IV SOSY
PREFILLED_SYRINGE | INTRAVENOUS | Status: DC | PRN
  Administered 2020-12-17: 60 mg via INTRAVENOUS

## 2020-12-17 MED ORDER — DIBUCAINE (PERIANAL) 1 % EX OINT
TOPICAL_OINTMENT | CUTANEOUS | Status: DC | PRN
  Administered 2020-12-17: 1 via RECTAL

## 2020-12-17 MED ORDER — LACTATED RINGERS IV SOLN: INTRAVENOUS | Status: DC

## 2020-12-17 MED ORDER — PROMETHAZINE HCL 25 MG/ML IJ SOLN: 6.2500 mg | INTRAMUSCULAR | Status: DC | PRN

## 2020-12-17 MED ORDER — OXYCODONE HCL 5 MG/5ML PO SOLN: 5.0000 mg | Freq: Once | ORAL | Status: DC | PRN

## 2020-12-17 MED ORDER — FENTANYL CITRATE (PF) 100 MCG/2ML IJ SOLN: 25.0000 ug | INTRAMUSCULAR | Status: DC | PRN

## 2020-12-17 MED ORDER — 0.9 % SODIUM CHLORIDE (POUR BTL) OPTIME
TOPICAL | Status: DC | PRN
Start: 2020-12-17 — End: 2020-12-17
  Administered 2020-12-17: 500 mL

## 2020-12-17 MED ORDER — MIDAZOLAM HCL 5 MG/5ML IJ SOLN
INTRAMUSCULAR | Status: DC | PRN
  Administered 2020-12-17: 1 mg via INTRAVENOUS

## 2020-12-17 MED ORDER — ACETAMINOPHEN 500 MG PO TABS
ORAL_TABLET | ORAL | Status: AC
  Filled 2020-12-17: qty 2

## 2020-12-17 MED ORDER — ONDANSETRON HCL 4 MG/2ML IJ SOLN
INTRAMUSCULAR | Status: DC | PRN
  Administered 2020-12-17: 4 mg via INTRAVENOUS

## 2020-12-17 SURGICAL SUPPLY — 65 items
APL SKNCLS STERI-STRIP NONHPOA (GAUZE/BANDAGES/DRESSINGS)
BENZOIN TINCTURE PRP APPL 2/3 (GAUZE/BANDAGES/DRESSINGS) IMPLANT
BLADE EXTENDED COATED 6.5IN (ELECTRODE) IMPLANT
BLADE HEX COATED 2.75 (ELECTRODE) IMPLANT
BLADE SURG 10 STRL SS (BLADE) IMPLANT
BLADE SURG 15 STRL LF DISP TIS (BLADE) ×2 IMPLANT
BLADE SURG 15 STRL SS (BLADE) ×3
BRIEF STRETCH FOR OB PAD LRG (UNDERPADS AND DIAPERS) ×3 IMPLANT
COVER BACK TABLE 60X90IN (DRAPES) ×3 IMPLANT
COVER MAYO STAND STRL (DRAPES) ×3 IMPLANT
DECANTER SPIKE VIAL GLASS SM (MISCELLANEOUS) IMPLANT
DRAPE HYSTEROSCOPY (MISCELLANEOUS) ×3 IMPLANT
DRAPE LAPAROTOMY 100X72 PEDS (DRAPES) IMPLANT
DRAPE SHEET LG 3/4 BI-LAMINATE (DRAPES) ×3 IMPLANT
DRAPE UTILITY XL STRL (DRAPES) IMPLANT
DRSG PAD ABDOMINAL 8X10 ST (GAUZE/BANDAGES/DRESSINGS) ×3 IMPLANT
GAUZE 4X4 16PLY ~~LOC~~+RFID DBL (SPONGE) ×3 IMPLANT
GAUZE SPONGE 4X4 12PLY STRL (GAUZE/BANDAGES/DRESSINGS) ×3 IMPLANT
GAUZE SPONGE 4X4 12PLY STRL LF (GAUZE/BANDAGES/DRESSINGS) ×3 IMPLANT
GLOVE SRG 8 PF TXTR STRL LF DI (GLOVE) ×2 IMPLANT
GLOVE SURG ENC MOIS LTX SZ7.5 (GLOVE) ×3 IMPLANT
GLOVE SURG UNDER POLY LF SZ8 (GLOVE) ×3
GOWN STRL REUS W/TWL LRG LVL3 (GOWN DISPOSABLE) ×3 IMPLANT
HYDROGEN PEROXIDE 16OZ (MISCELLANEOUS) IMPLANT
IV CATH 14GX2 1/4 (CATHETERS) IMPLANT
IV CATH 18G SAFETY (IV SOLUTION) IMPLANT
KIT SIGMOIDOSCOPE (SET/KITS/TRAYS/PACK) IMPLANT
KIT TURNOVER CYSTO (KITS) ×3 IMPLANT
LEGGING LITHOTOMY PAIR STRL (DRAPES) ×3 IMPLANT
LOOP VESSEL MAXI BLUE (MISCELLANEOUS) ×3 IMPLANT
NDL SAFETY ECLIPSE 18X1.5 (NEEDLE) IMPLANT
NEEDLE HYPO 18GX1.5 SHARP (NEEDLE)
NEEDLE HYPO 22GX1.5 SAFETY (NEEDLE) ×3 IMPLANT
NEEDLE HYPO 25X1 1.5 SAFETY (NEEDLE) ×3 IMPLANT
NS IRRIG 500ML POUR BTL (IV SOLUTION) ×3 IMPLANT
PACK BASIN DAY SURGERY FS (CUSTOM PROCEDURE TRAY) ×3 IMPLANT
PENCIL SMOKE EVACUATOR (MISCELLANEOUS) ×3 IMPLANT
SPONGE HEMORRHOID 8X3CM (HEMOSTASIS) IMPLANT
SPONGE SURGIFOAM ABS GEL 12-7 (HEMOSTASIS) IMPLANT
SUCTION FRAZIER HANDLE 10FR (MISCELLANEOUS)
SUCTION TUBE FRAZIER 10FR DISP (MISCELLANEOUS) IMPLANT
SUT CHROMIC 2 0 SH (SUTURE) IMPLANT
SUT CHROMIC 3 0 SH 27 (SUTURE) IMPLANT
SUT ETHIBOND 0 (SUTURE) ×3 IMPLANT
SUT MNCRL AB 4-0 PS2 18 (SUTURE) IMPLANT
SUT SILK 0 PSL NDL (SUTURE) IMPLANT
SUT SILK 0 TIES 10X30 (SUTURE) ×3 IMPLANT
SUT SILK 2 0 (SUTURE)
SUT SILK 2-0 18XBRD TIE 12 (SUTURE) IMPLANT
SUT VIC AB 2-0 SH 27 (SUTURE)
SUT VIC AB 2-0 SH 27XBRD (SUTURE) IMPLANT
SUT VIC AB 3-0 SH 18 (SUTURE) IMPLANT
SUT VIC AB 3-0 SH 27 (SUTURE)
SUT VIC AB 3-0 SH 27X BRD (SUTURE) IMPLANT
SUT VIC AB 3-0 SH 27XBRD (SUTURE) IMPLANT
SUT VIC AB 4-0 P-3 18XBRD (SUTURE) IMPLANT
SUT VIC AB 4-0 P3 18 (SUTURE)
SYR 20ML LL LF (SYRINGE) IMPLANT
SYR BULB IRRIG 60ML STRL (SYRINGE) ×3 IMPLANT
SYR CONTROL 10ML LL (SYRINGE) ×3 IMPLANT
SYR TB 1ML LL NO SAFETY (SYRINGE) ×3 IMPLANT
TOWEL OR 17X26 10 PK STRL BLUE (TOWEL DISPOSABLE) ×3 IMPLANT
TRAY DSU PREP LF (CUSTOM PROCEDURE TRAY) ×3 IMPLANT
TUBE CONNECTING 12X1/4 (SUCTIONS) ×3 IMPLANT
YANKAUER SUCT BULB TIP NO VENT (SUCTIONS) ×3 IMPLANT

## 2020-12-17 NOTE — Op Note (Signed)
12/17/2020  12:52 PM  PATIENT:  [name]  50 y.o. 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  EBL: 1 mL  PLAN OF CARE: Discharge to home after PACU  PATIENT DISPOSITION:  PACU - hemodynamically stable.  OR FINDINGS: Posterior midline short tract anal fistula that appears to be intersphincteric.  It involves a small bundle of external sphincter muscle but terminates just before the intersphincteric groove.  This would be consistent with him having had a prior deep anal fissure that then healed into more of a fistula.  He has approximately 1 cm between the internal and external openings of tissue.  Given its intersphincteric nature and short distance between openings with fibrosis between, does not appear to be a good candidate for any sort of anocutaneous advancement flaps; too distal for endorectal advancement flap as well. Given our preoperative discussions and attempts to minimize any disturbance to continence, we opted to proceed with placement of a cutting seton.  DESCRIPTION: The patient was identified in the preoperative holding area and taken to the OR where he was placed on the operating room table. SCDs were placed.  General anesthesia was induced without difficulty. The patient was then positioned in high lithotomy with Allen stirrups. Pressure points were then evaluated and padded.   He was then prepped and draped in usual sterile fashion.  A surgical timeout was performed indicating the correct patient, procedure, and positioning.  A perianal block was performed using a dilute mixture of 0.25% Marcaine with epinephrine and Exparel.   After ascertaining that an appropriate level of anesthesia had been achieved, a well lubricated digital rectal exam was performed. This demonstrated no palpable masses.  Externally, in the posterior midline just beyond the anal opening there is chronic appearing granulation tissue consistent with an external opening.  A Hill-Ferguson anoscope was into the anal canal and circumferential inspection demonstrated normal-appearing anoderm without ulceration.  No active anal fissures.  Small internal and external hemorrhoids.  A lubricated fistula probe is then used to interrogate the fistula.  Palpation of the overlying tissue demonstrates that this posterior midline anal fistula is at least intersphincteric in nature.  This appears to involve a small (<10%) bundle of external sphincter muscle.  Distance between internal/external openings is at or underneath a centimeter.  This is too short and fibrotic for an anocutaneous advancement flap and too distal for an endorectal flap. Would not be a LIFT candidate either.Given our preoperative discussions and his goals, we opted to proceed with placement of cutting seton. The overlying anoderm is incised. A partial fistulotomy was carried out externally until the muscle fibers were approached.  Chronic granulation tissue is then gently excised and fulgurated.  A blue vessel loop is then placed through the fistula tract and secured to itself using 0 silk suture.  This is set to be somewhat snug but not excessively so.  Additional local anesthetic is infiltrated.  Sponge, needle,  instrument counts were reported correct x2.  He is then taken out of lithotomy, awakened from anesthesia, extubated, and transferred to a  stretcher for transport to PACU in satisfactory condition.  DISPOSITION: PACU in satisfactory condition.

## 2020-12-17 NOTE — Anesthesia Procedure Notes (Signed)
Procedure Name: LMA Insertion Date/Time: 12/17/2020 12:14 PM Performed by: Mechele Claude, CRNA Pre-anesthesia Checklist: Patient identified, Emergency Drugs available, Suction available and Patient being monitored Patient Re-evaluated:Patient Re-evaluated prior to induction Oxygen Delivery Method: Circle system utilized Preoxygenation: Pre-oxygenation with 100% oxygen Induction Type: IV induction Ventilation: Mask ventilation without difficulty LMA: LMA inserted LMA Size: 4.0 Number of attempts: 1 Airway Equipment and Method: Bite block Placement Confirmation: positive ETCO2 Tube secured with: Tape Dental Injury: Teeth and Oropharynx as per pre-operative assessment

## 2020-12-17 NOTE — Anesthesia Postprocedure Evaluation (Signed)
Anesthesia Post Note  Patient: [name]  Procedure(s) Performed: ANORECTAL EXAM UNDER ANESTHESIA (Rectum) PARTIAL ANAL FISTULOTOMY (Rectum) PLACEMETN OF CUTTING SETON (Rectum)     Patient location during evaluation: PACU Anesthesia Type: General Level of consciousness: awake Pain management: pain level controlled Vital Signs Assessment: post-procedure vital signs reviewed and stable Respiratory status: spontaneous breathing, nonlabored ventilation, respiratory function stable and patient connected to nasal cannula oxygen Cardiovascular status: blood pressure returned to baseline and stable Postop Assessment: no apparent nausea or vomiting Anesthetic complications: no   No notable events documented.  Last Vitals:  Vitals:   12/17/20 1330 12/17/20 1515  BP: 124/79 (!) 144/93  Pulse: 69 (!) 56  Resp: 14 16  Temp:  36.6 C  SpO2: 99% 100%    Last Pain:  Vitals:   12/17/20 1515  TempSrc: Oral  PainSc:                  Aracelia Brinson P Strummer Canipe

## 2020-12-17 NOTE — Transfer of Care (Addendum)
Immediate Anesthesia Transfer of Care Note  Patient: [name]  Procedure(s) Performed: Procedure(s) (LRB): ANORECTAL EXAM UNDER ANESTHESIA (N/A) PARTIAL ANAL FISTULOTOMY (N/A) PLACEMETN OF CUTTING SETON (N/A)  Patient Location: PACU  Anesthesia Type: General  Level of Consciousness: awake, alert  and oriented  Airway & Oxygen Therapy: Patient Spontanous Breathing and Patient connected to nasal cannula oxygen  Post-op Assessment: Report given to PACU RN and Post -op Vital signs reviewed and stable  Post vital signs: Reviewed and stable  Complications: No apparent anesthesia complications  Last Vitals:  Vitals Value Taken Time  BP 128/83 12/17/20 1300  Temp 36.7 C 12/17/20 1256  Pulse 78 12/17/20 1302  Resp 24 12/17/20 1302  SpO2 100 % 12/17/20 1302  Vitals shown include unvalidated device data.  Last Pain:  Vitals:   12/17/20 1004  TempSrc: Oral  PainSc: 0-No pain      Patients Stated Pain Goal: 3 (38/45/36 4680)  Complications: No notable events documented.

## 2020-12-17 NOTE — Anesthesia Preprocedure Evaluation (Addendum)
Anesthesia Evaluation  Patient identified by MRN, date of birth, ID band Patient awake    Reviewed: Allergy & Precautions, NPO status , Patient's Chart, lab work & pertinent test results  Airway Mallampati: II  TM Distance: >3 FB Neck ROM: Full    Dental no notable dental hx.    Pulmonary neg pulmonary ROS,    Pulmonary exam normal breath sounds clear to auscultation       Cardiovascular Normal cardiovascular exam Rhythm:Regular Rate:Normal  ECG: NSR, rate 72  ECHO: 1. Left ventricular ejection fraction, by estimation, is 60 to 65%. The left ventricle has normal function. The left ventricle has no regional wall motion abnormalities. Left ventricular diastolic parameters are consistent with GradeI diastolic  dysfunction (impaired relaxation). 2. Right ventricular systolic function is normal. The right ventricular sizeis normal. 3. The mitral valve is normal in structure. No evidence of mitral valveregurgitation. No evidence of mitral stenosis. 4. The aortic valve is tricuspid. Aortic valve regurgitation is notvisualized. No aortic stenosis is present. 5. The inferior vena cava is normal in size with greater than 50% respiratoryvariability, suggesting right atrial pressure of 3 mmHg.   Neuro/Psych  Headaches, PSYCHIATRIC DISORDERS Anxiety    GI/Hepatic negative GI ROS, Neg liver ROS,   Endo/Other  negative endocrine ROS  Renal/GU negative Renal ROS     Musculoskeletal negative musculoskeletal ROS (+)   Abdominal   Peds  Hematology negative hematology ROS (+)   Anesthesia Other Findings CHRONIC ANAL FISSURE ANAL FISTULA  Reproductive/Obstetrics                            Anesthesia Physical Anesthesia Plan  ASA: 2  Anesthesia Plan: General   Post-op Pain Management:    Induction: Intravenous  PONV Risk Score and Plan: 3 and Ondansetron, Dexamethasone, Midazolam and Treatment may  vary due to age or medical condition  Airway Management Planned: LMA  Additional Equipment:   Intra-op Plan:   Post-operative Plan: Extubation in OR  Informed Consent: I have reviewed the patients History and Physical, chart, labs and discussed the procedure including the risks, benefits and alternatives for the proposed anesthesia with the patient or authorized representative who has indicated his/her understanding and acceptance.     Dental advisory given  Plan Discussed with: CRNA  Anesthesia Plan Comments:         Anesthesia Quick Evaluation

## 2020-12-17 NOTE — Discharge Instructions (Addendum)
ANORECTAL SURGERY: POST OP INSTRUCTIONS  DIET: Follow a light bland diet the first 24 hours after arrival home, such as soup, liquids, crackers, etc.  Be sure to include lots of fluids daily.  Avoid fast food or heavy meals as your are more likely to get nauseated.  Eat a low fat diet the next few days after surgery.   Some bleeding with bowel movements is expected for the first couple of days but this should stop in between bowel movements  Take your usually prescribed home medications unless otherwise directed.  PAIN CONTROL: It is helpful to take an over-the-counter pain medication regularly for the first few days/weeks.  Choose from the following that works best for you: Ibuprofen (Advil, etc) Three 200mg tabs every 6 hours as needed. Acetaminophen (Tylenol, etc) 500-650mg every 6 hours as needed NOTE: You may take both of these medications together - most patients find it most helpful when alternating between the two (i.e. Ibuprofen at 6am, tylenol at 9am, ibuprofen at 12pm ...) A  prescription for pain medication may have been prescribed for you at discharge.  Take your pain medication as prescribed.  If you are having problems/concerns with the prescription medicine, please call us for further advice.  Avoid getting constipated.  Between the surgery and the pain medications, it is common to experience some constipation.  Increasing fluid intake (64oz of water per day) and taking a fiber supplement (such as Metamucil, Citrucel, FiberCon) 1-2 times a day regularly will usually help prevent this problem from occurring.  Take Miralax (over the counter) 1-2x/day while taking a narcotic pain medication. If no bowel movement after 48hours, you may additionally take a laxative like a bottle of Milk of Magnesia which can be purchased over the counter. Avoid enemas if possible as these are often painful.   Watch out for diarrhea.  If you have many loose bowel movements, simplify your diet to bland  foods.  Stop any stool softeners and decrease your fiber supplement. If this worsens or does not improve, please call us.  Wash / shower every day.  If you were discharged with a dressing, you may remove this the day after your surgery. You may shower normally, getting soap/water on your wound, particularly after bowel movements.  Soaking in a warm bath filled a couple inches ("Sitz bath") is a great way to clean the area after a bowel movement and many patients find it is a way to soothe the area.  ACTIVITIES as tolerated:   You may resume regular (light) daily activities beginning the next day--such as daily self-care, walking, climbing stairs--gradually increasing activities as tolerated.  If you can walk 30 minutes without difficulty, it is safe to try more intense activity such as jogging, treadmill, bicycling, low-impact aerobics, etc. Refrain from any heavy lifting or straining for the first 2 weeks after your procedure, particularly if your surgery was for hemorrhoids. Avoid activities that make your pain worse You may drive when you are no longer taking prescription pain medication, you can comfortably wear a seatbelt, and you can safely maneuver your car and apply brakes.  FOLLOW UP in our office Please call [facility] at [phone] to set up an appointment to see your surgeon in the office for a follow-up appointment approximately 2 weeks after your surgery. Make sure that you call for this appointment the day you arrive home to insure a convenient appointment time.  9. If you have disability or family leave forms that need to be completed,   you may have them completed by your primary care physician's office; for return to work instructions, please ask our office staff and they will be happy to assist you in obtaining this documentation   When to call us [phone]: Poor pain control Reactions / problems with new medications (rash/itching, etc)  Fever over 101.5 F (38.5 C) Inability  to urinate Nausea/vomiting Worsening swelling or bruising Continued bleeding from incision. Increased pain, redness, or drainage from the incision  The clinic staff is available to answer your questions during regular business hours (8:30am-5pm).  Please don't hesitate to call and ask to speak to one of our nurses for clinical concerns.   A surgeon from [facility] is always on call at the hospitals   If you have a medical emergency, go to the nearest emergency room or call 911.   [facility], [place], [place], [place], [place]  [address] ? MAIN: [phone] FAX [phone] [email or web address]    Post Anesthesia Home Care Instructions  Activity: Get plenty of rest for the remainder of the day. A responsible individual must stay with you for 24 hours following the procedure.  For the next 24 hours, DO NOT: -Drive a car -Paediatric nurse -Drink alcoholic beverages -Take any medication unless instructed by your physician -Make any legal decisions or sign important papers.  Meals: Start with liquid foods such as gelatin or soup. Progress to regular foods as tolerated. Avoid greasy, spicy, heavy foods. If nausea and/or vomiting occur, drink only clear liquids until the nausea and/or vomiting subsides. Call your physician if vomiting continues.  Special Instructions/Symptoms: Your throat may feel dry or sore from the anesthesia or the breathing tube placed in your throat during surgery. If this causes discomfort, gargle with warm salt water. The discomfort should disappear within 24 hours.  If you had a scopolamine patch placed behind your ear for the management of post- operative nausea and/or vomiting:  1. The medication in the patch is effective for 72 hours, after which it should be removed.  Wrap patch in a tissue and discard in the trash. Wash hands thoroughly with soap and water. 2. You may remove the patch earlier  than 72 hours if you experience unpleasant side effects which may include dry mouth, dizziness or visual disturbances. 3. Avoid touching the patch. Wash your hands with soap and water after contact with the patch.Information for Discharge Teaching: EXPAREL (bupivacaine liposome injectable suspension)   Your surgeon or anesthesiologist gave you EXPAREL(bupivacaine) to help control your pain after surgery.  EXPAREL is a local anesthetic that provides pain relief by numbing the tissue around the surgical site. EXPAREL is designed to release pain medication over time and can control pain for up to 72 hours. Depending on how you respond to EXPAREL, you may require less pain medication during your recovery.  Possible side effects: Temporary loss of sensation or ability to move in the area where bupivacaine was injected. Nausea, vomiting, constipation Rarely, numbness and tingling in your mouth or lips, lightheadedness, or anxiety may occur. Call your doctor right away if you think you may be experiencing any of these sensations, or if you have other questions regarding possible side effects.  Follow all other discharge instructions given to you by your surgeon or nurse. Eat a healthy diet and drink plenty of water or other fluids.  If you return to the hospital for any reason within 96 hours, Friday 7/22,22, following the administration of EXPAREL, it  is important for health care providers to know that you have received this anesthetic. A teal colored band has been placed on your arm with the date, time and amount of EXPAREL you have received in order to alert and inform your health care providers. Please leave this armband in place for the full 96 hours following administration, and then you may remove the band.

## 2020-12-17 NOTE — H&P (Signed)
CC: Referred to see me for possible anal fistula   HPI: [name] is a very pleasant 49yoM with hx of headaches, developing ascribes to be significant perianal pain that was knifelike and sensation with pelvic floor muscle spasm associated bowel movements.  This all began in February of this year.  Subsequent to this, he developed issues with significant perianal swelling that ultimately resulted in some perianal drainage of purulent like fluid.  This all began to improve over time but he developed recurrent perianal abscess and had an incision/drainage procedure done in the office here.  He follows with GI, Dr. Barron Alvine, and has been prescribed topical nitroglycerin. This has exacerbated his headaches.   His most recent colonoscopy was completed in September, 2021 and he had a few small polyps removed. Random biopsies did not show evidence of microscopic colitis and IBD.  INTERVAL HX He denies any changes in his health or health hx since we met. Had pelvic MRI suspicious for fistula as well.    PMH: Headaches   PSH: Perianal I&D; lap chole, lap appy (2012 - path showed acute and chronic appendicitis), hernia repair with mesh   FHx: Denies FHx of colorectal, breast, endometrial, ovarian or cervical cancer   Social: Denies use of tobacco/EtOH/drugs. Previously worked as Transport planner   ROS: A comprehensive 10 system review of systems was completed with the patient and pertinent findings as noted above.  Past Medical History:  Diagnosis Date   Abdominal wall hernia 12/05/2019   Aches 05/12/2018   Advance care planning 06/14/2017   Living will d/w pt.  Justine Null designated if patient were incapacitated.     ALLERGIC RHINITIS 01/22/2010   Qualifier: Diagnosis of  By: Etheleen Mayhew CMA (AAMA), [place]     Allergy    Back pain 12/05/2019   Chickenpox    Complication of anesthesia    hard to wake up-does not take much   Dyskinesia    FH: CAD (coronary artery disease)  12/05/2019   FHx: thyroid disease 12/05/2019   Generalized anxiety disorder 05/31/2010   Headache(784.0)    Migraine   History of kidney stones    Impingement syndrome of left shoulder    Stage II    Inattention 03/11/2011   Joint pain 08/13/2014   Medial meniscus tear    left   Migraine headache 01/22/2010   Qualifier: Diagnosis of  By: Etheleen Mayhew CMA (AAMA), Lugene     Nasal septal deviation 01/25/2014   Neck pain 01/05/2019   Other specified abdominal hernia without obstruction or gangrene 02/28/2020   PANCREATITIS, HX OF 02/01/2010   Qualifier: Diagnosis of  By: Dance CMA (AAMA), Kim     SHOULDER PAIN, LEFT 01/22/2010   Per [facility]     Skin lesion 02/07/2017   Tear of meniscus, medial    left knee   Temporal headache 02/06/2020   Ulnar neuropathy    mild symptoms    Unspecified inflammatory spondylopathy, sacral and sacrococcygeal region [facility]) 12/05/2010   Formatting of this note might be different from the original. 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 [facility]    APPENDECTOMY  2012   lap append   CHOLECYSTECTOMY  2009   CLOSED REDUCTION CLAVICLE FRACTURE  1993   left  COLONOSCOPY  2008   ESOPHAGOGASTRODUODENOSCOPY     [place]    GALLBLADDER SURGERY  2008   HERNIA REPAIR  2011   repair from gallbladder surgery   HERNIA REPAIR  [id number]   umbilical   HERNIA REPAIR  2021   VENTRAL HERNIA REPAIR   LITHOTRIPSY  2000   mesh surgery  2011   abdominal   NASAL SEPTOPLASTY W/ TURBINOPLASTY Bilateral 01/25/2014   Procedure: NASAL SEPTOPLASTY/BILATERAL INFERIOR TURBINATE REDUCTION;  Surgeon: Jerrell Belfast, MD;  Location: [facility];  Service: ENT;  Laterality: Bilateral;   WISDOM TOOTH EXTRACTION      Family  History  Problem Relation Age of Onset   Cancer Mother        gyn cancer   Liver disease Mother        NASH   Esophageal varices Mother        due to spleen and liver fuctions not working properly    Heart disease Father    Colon cancer Neg Hx    Prostate cancer Neg Hx    Esophageal cancer Neg Hx    Rectal cancer Neg Hx    Stomach cancer Neg Hx     Social:  reports that he has never smoked. He has never used smokeless tobacco. He reports current alcohol use. He reports that he does not use drugs.  Allergies:  Allergies  Allergen Reactions   Minocycline Hives    Hives- can tolerate doxycycline.    Morphine Nausea And Vomiting    REACTION: vomiting   Pseudoephedrine Other (See Comments)    Urinary retention   Topiramate Other (See Comments)   Aimovig [Erenumab-Aooe]     GI spasm   Cephalexin     REACTION: rash   Citalopram Hydrobromide     Sedation, lack of motivation   Cyclobenzaprine Hcl     REACTION: sedation   Levaquin [Levofloxacin] Other (See Comments)    Diffuse aches while on med. TISSUE DEGREDATION   Sulfonamide Derivatives     REACTION: rash   Sumatriptan    Codeine Nausea Only    REACTION: nausea. Can tolerate hydrocodone   Venlafaxine Nausea Only    Nausea, intolerant    Medications: I have reviewed the patient's current medications.  No results found for this or any previous visit (from the past 48 hour(s)).  No results found.  ROS - all of the below systems have been reviewed with the patient and positives are indicated with bold text General: chills, fever or night sweats Eyes: blurry vision or double vision ENT: epistaxis or sore throat Allergy/Immunology: itchy/watery eyes or nasal congestion Hematologic/Lymphatic: bleeding problems, blood clots or swollen lymph nodes Endocrine: temperature intolerance or unexpected weight changes Breast: new or changing breast lumps or nipple discharge Resp: cough, shortness of breath, or wheezing CV:  chest pain or dyspnea on exertion GI: as per HPI GU: dysuria, trouble voiding, or hematuria MSK: joint pain or joint stiffness Neuro: TIA or stroke symptoms Derm: pruritus and skin lesion changes Psych: anxiety and depression  PE Blood pressure 139/87, pulse 69, temperature 98.7 F (37.1 C), temperature source Oral, resp. rate 16, height $RemoveBe'5\' 7"'[id number]$  (1.702 m), weight 79.1 kg, SpO2 100 %. Constitutional: NAD; conversant; no deformities Eyes: Moist conjunctiva; no lid lag; anicteric; PERRL Lungs: Normal respiratory effort CV: RRR; no pitting edema GI: Abd soft, NT/ND MSK: Normal range of motion of extremities; no clubbing/cyanosis Psychiatric: Appropriate affect; alert and oriented x3 Lymphatic: No palpable cervical or  axillary lymphadenopathy  No results found for this or any previous visit (from the past 48 hour(s)).  No results found.   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 The pathophysiology of anal abscess, fistula and fissures was discussed with associated pictures and illustrations using our ASCRS handouts -We reviewed options going forward. For the immediate time, I have recommended he begin taking a daily fiber supplement such as Metamucil or Benefiber-1-2 tablespoons per day. Working to maintain hydration. Continuing to use his commode bidet. -We discussed surgical options. This would include anorectal exam under anesthesia and assessing the fistula. If this is a short tract anal fistula without any significant muscle involvement, the possibility of primary fistulotomy. Deeper, the potential for a seton. If he has evident anal fissure, concurrently injecting perianal Botox to accomplish a chemical sphincterotomy. -The planned procedures, material risks  (including, but not limited to, pain, bleeding, infection, scarring, need for blood transfusion, damage to anal sphincter, incontinence of gas and/or stool, need for additional procedures, recurrence of fissure and/or fistula, pneumonia, heart attack, stroke, death) benefits and alternatives to surgery were discussed at length. We noted a good probability that the procedure would help improve his symptoms. The patient's questions were answered to his satisfaction, he voiced understanding and elected to proceed with surgery. Additionally, we discussed typical postoperative expectations and the recovery process.  Nadeen Landau, MD [facility] Use [email or web address] to contact on call provider

## 2020-12-18 ENCOUNTER — Encounter (HOSPITAL_BASED_OUTPATIENT_CLINIC_OR_DEPARTMENT_OTHER): Payer: Self-pay | Admitting: Surgery

## 2020-12-21 DIAGNOSIS — Z6828 Body mass index (BMI) 28.0-28.9, adult: Secondary | ICD-10-CM | POA: Diagnosis not present

## 2020-12-21 DIAGNOSIS — R030 Elevated blood-pressure reading, without diagnosis of hypertension: Secondary | ICD-10-CM | POA: Diagnosis not present

## 2020-12-21 DIAGNOSIS — M47812 Spondylosis without myelopathy or radiculopathy, cervical region: Secondary | ICD-10-CM | POA: Diagnosis not present

## 2020-12-26 DIAGNOSIS — K432 Incisional hernia without obstruction or gangrene: Secondary | ICD-10-CM | POA: Diagnosis not present

## 2020-12-26 DIAGNOSIS — Z01812 Encounter for preprocedural laboratory examination: Secondary | ICD-10-CM | POA: Diagnosis not present

## 2020-12-26 DIAGNOSIS — Z01810 Encounter for preprocedural cardiovascular examination: Secondary | ICD-10-CM | POA: Diagnosis not present

## 2020-12-26 DIAGNOSIS — I498 Other specified cardiac arrhythmias: Secondary | ICD-10-CM | POA: Diagnosis not present

## 2020-12-28 DIAGNOSIS — K432 Incisional hernia without obstruction or gangrene: Secondary | ICD-10-CM | POA: Diagnosis not present

## 2021-01-15 DIAGNOSIS — M47812 Spondylosis without myelopathy or radiculopathy, cervical region: Secondary | ICD-10-CM | POA: Diagnosis not present

## 2021-01-15 DIAGNOSIS — Z6827 Body mass index (BMI) 27.0-27.9, adult: Secondary | ICD-10-CM | POA: Diagnosis not present

## 2021-01-15 DIAGNOSIS — M461 Sacroiliitis, not elsewhere classified: Secondary | ICD-10-CM | POA: Diagnosis not present

## 2021-01-15 DIAGNOSIS — R030 Elevated blood-pressure reading, without diagnosis of hypertension: Secondary | ICD-10-CM | POA: Diagnosis not present

## 2021-01-25 ENCOUNTER — Encounter: Payer: Self-pay | Admitting: Family Medicine

## 2021-01-28 ENCOUNTER — Other Ambulatory Visit: Payer: Self-pay

## 2021-01-28 ENCOUNTER — Encounter: Payer: Self-pay | Admitting: Family Medicine

## 2021-01-28 ENCOUNTER — Ambulatory Visit (INDEPENDENT_AMBULATORY_CARE_PROVIDER_SITE_OTHER): Payer: BC Managed Care – PPO | Admitting: Family Medicine

## 2021-01-28 VITALS — BP 130/80 | HR 78 | Temp 96.9°F | Ht 67.0 in | Wt 180.0 lb

## 2021-01-28 DIAGNOSIS — K224 Dyskinesia of esophagus: Secondary | ICD-10-CM

## 2021-01-28 DIAGNOSIS — M549 Dorsalgia, unspecified: Secondary | ICD-10-CM

## 2021-01-28 MED ORDER — PREDNISONE 10 MG PO TABS: ORAL_TABLET | ORAL | 0 refills | Status: DC

## 2021-01-28 NOTE — Progress Notes (Signed)
This visit occurred during the SARS-CoV-2 public health emergency.  Safety protocols were in place, including screening questions prior to the visit, additional usage of staff PPE, and extensive cleaning of exam room while observing appropriate contact time as indicated for disinfecting solutions.  He is still having recurrent esophageal spasms and that can affect swallowing liquids or solids, started after using aimovig.  Episodic sx.  D/w pt about getting GI appointment.  Referral ordered.    He had rectal procedure for posterior midline fistula.  He has seen gen surgery re: incisional hernia.  S/p robotic incisional hernia repair with mesh by Dr. Dema Severin on 12/28/20  He had been taking advil for back pain but is still in pain.  He is trying to get an appeal re: ablation coverage per outside clinic.  He didn't tolerate flexeril.  D/w pt about options. R SI joint pain, can radiate down the R leg.    Meds, vitals, and allergies reviewed.   ROS: Per HPI unless specifically indicated in ROS section   GEN: nad, alert and oriented HEENT: ncat NECK: supple w/o LA CV: rrr.   PULM: ctab, no inc wob ABD: soft, +bs,abdominal wall with midline postsurgical changes noted.  EXT: no edema SKIN: Well-perfused Right SI joint area tender. Able to bear weight.  32 minutes were devoted to patient care in this encounter (this includes time spent reviewing the patient's file/history, interviewing and examining the patient, counseling/reviewing plan with patient).

## 2021-01-28 NOTE — Patient Instructions (Addendum)
Stop the ibuprofen and change to prednisone.   Check with your insurance to see if they will cover the shingles shot. I would get a flu shot each fall.   I would get the covid shot when possible.   Take care.  Glad to see you.

## 2021-01-30 DIAGNOSIS — K224 Dyskinesia of esophagus: Secondary | ICD-10-CM | POA: Insufficient documentation

## 2021-01-30 NOTE — Assessment & Plan Note (Signed)
He is trying to get an appeal done for his insurance denial on ablation.  We discussed options in the meantime. Stop ibuprofen and change to prednisone.  Routine steroid cautions given to patient.  I would defer vaccination until he is done with his steroid course. He can check with insurance to see if they will cover the shingles shot. I would get a flu shot each fall.   I would get the covid shot when possible.   He agrees with plan and he will update me as needed.

## 2021-01-30 NOTE — Assessment & Plan Note (Signed)
With symptoms that were clearly worse after using Aimovig.  Discussed GI referral.  Ordered.

## 2021-02-07 DIAGNOSIS — Z9049 Acquired absence of other specified parts of digestive tract: Secondary | ICD-10-CM | POA: Diagnosis not present

## 2021-02-07 DIAGNOSIS — N200 Calculus of kidney: Secondary | ICD-10-CM | POA: Diagnosis not present

## 2021-02-07 DIAGNOSIS — R109 Unspecified abdominal pain: Secondary | ICD-10-CM | POA: Diagnosis not present

## 2021-02-13 ENCOUNTER — Encounter: Payer: Self-pay | Admitting: Family Medicine

## 2021-02-18 DIAGNOSIS — M4692 Unspecified inflammatory spondylopathy, cervical region: Secondary | ICD-10-CM | POA: Diagnosis not present

## 2021-02-18 DIAGNOSIS — M542 Cervicalgia: Secondary | ICD-10-CM | POA: Diagnosis not present

## 2021-02-26 DIAGNOSIS — M47812 Spondylosis without myelopathy or radiculopathy, cervical region: Secondary | ICD-10-CM | POA: Diagnosis not present

## 2021-03-01 ENCOUNTER — Encounter: Payer: Self-pay | Admitting: Family Medicine

## 2021-03-01 ENCOUNTER — Other Ambulatory Visit: Payer: Self-pay

## 2021-03-01 ENCOUNTER — Ambulatory Visit (INDEPENDENT_AMBULATORY_CARE_PROVIDER_SITE_OTHER): Payer: BC Managed Care – PPO | Admitting: Family Medicine

## 2021-03-01 VITALS — BP 130/80 | HR 91 | Temp 98.0°F | Ht 67.0 in | Wt 182.0 lb

## 2021-03-01 DIAGNOSIS — Z0000 Encounter for general adult medical examination without abnormal findings: Secondary | ICD-10-CM | POA: Diagnosis not present

## 2021-03-01 DIAGNOSIS — Z7189 Other specified counseling: Secondary | ICD-10-CM

## 2021-03-01 DIAGNOSIS — M549 Dorsalgia, unspecified: Secondary | ICD-10-CM

## 2021-03-01 NOTE — Progress Notes (Signed)
This visit occurred during the SARS-CoV-2 public health emergency.  Safety protocols were in place, including screening questions prior to the visit, additional usage of staff PPE, and extensive cleaning of exam room while observing appropriate contact time as indicated for disinfecting solutions.  CPE- See plan.  Routine anticipatory guidance given to patient.  See health maintenance.  The possibility exists that previously documented standard health maintenance information may have been brought forward from a previous encounter into this note.  If needed, that same information has been updated to reflect the current situation based on today's encounter.    Tetanus 2013 Flu to be done next week.   PNA not due. Shingrix d/w pt.  He'll check on coverage.    covid 2021 Colonoscopy 2021 Prostate cancer screening and PSA options (with potential risks and benefits of testing vs not testing) were discussed along with recent recs/guidelines.  He opted for testing PSA at this point. Living will d/w pt.  Bethanne Ginger designated if patient were incapacitated.   Diet and exercise d/w pt.  HIV and HCV prev done at [facility] ~1991.    He didn't tolerate celebrex due to facial skin changes.  He had changed to meloxicam but occ noted a metallic taste with med use.  D/w pt about trying 7.5mg  meloxicam daily if needed.  He'll update me as needed.    He is going to see Dr. Jacelyn Grip about his neck and see about options for SI pain.  Pain with sitting and walking.  He is hoping meloxicam will help and he is going to PT soon.  He is going for massages.    PMH and SH reviewed Meds, vitals, and allergies reviewed.   ROS: Per HPI.  Unless specifically indicated otherwise in HPI, the patient denies:  General: fever. Eyes: acute vision changes ENT: sore throat Cardiovascular: chest pain Respiratory: SOB GI: vomiting GU: dysuria Musculoskeletal: acute back pain Derm: acute rash Neuro: acute motor  dysfunction Psych: worsening mood Endocrine: polydipsia Heme: bleeding Allergy: hayfever  GEN: nad, alert and oriented HEENT: ncat NECK: supple w/o LA CV: rrr. PULM: ctab, no inc wob ABD: soft, +bs EXT: no edema SKIN: no acute rash

## 2021-03-01 NOTE — Patient Instructions (Signed)
I'll await your consult notes.  Please update me as needed.  Take care.  Glad to see you.

## 2021-03-03 DIAGNOSIS — Z0000 Encounter for general adult medical examination without abnormal findings: Secondary | ICD-10-CM | POA: Insufficient documentation

## 2021-03-03 NOTE — Assessment & Plan Note (Signed)
Living will d/w pt.  Bethanne Ginger designated if patient were incapacitated.

## 2021-03-03 NOTE — Assessment & Plan Note (Signed)
He didn't tolerate celebrex due to facial skin changes.  He had changed to meloxicam but occ noted a metallic taste with med use.  D/w pt about trying 7.5mg  meloxicam daily if needed.  He'll update me as needed.    He is going to see Dr. Jacelyn Grip about his neck and see about options for SI pain.  Pain with sitting and walking.  He is hoping meloxicam will help and he is going to PT soon.  He is going for massages.  I will defer.  Patient agrees.

## 2021-03-03 NOTE — Assessment & Plan Note (Signed)
Tetanus 2013 Flu to be done next week.   PNA not due. Shingrix d/w pt.  He'll check on coverage.    covid 2021 Colonoscopy 2021 Prostate cancer screening and PSA options (with potential risks and benefits of testing vs not testing) were discussed along with recent recs/guidelines.  He opted for testing PSA at this point. Living will d/w pt.  Bethanne Ginger designated if patient were incapacitated.   Diet and exercise d/w pt.  HIV and HCV prev done at [facility] ~1991.

## 2021-03-11 DIAGNOSIS — M533 Sacrococcygeal disorders, not elsewhere classified: Secondary | ICD-10-CM | POA: Diagnosis not present

## 2021-03-11 DIAGNOSIS — M25551 Pain in right hip: Secondary | ICD-10-CM | POA: Diagnosis not present

## 2021-03-11 DIAGNOSIS — R269 Unspecified abnormalities of gait and mobility: Secondary | ICD-10-CM | POA: Diagnosis not present

## 2021-03-11 DIAGNOSIS — M542 Cervicalgia: Secondary | ICD-10-CM | POA: Diagnosis not present

## 2021-03-11 DIAGNOSIS — M25552 Pain in left hip: Secondary | ICD-10-CM | POA: Diagnosis not present

## 2021-03-12 ENCOUNTER — Other Ambulatory Visit: Payer: Self-pay | Admitting: Family Medicine

## 2021-03-12 ENCOUNTER — Encounter: Payer: Self-pay | Admitting: Family Medicine

## 2021-03-12 ENCOUNTER — Other Ambulatory Visit: Payer: Self-pay | Admitting: Orthopedic Surgery

## 2021-03-12 DIAGNOSIS — M533 Sacrococcygeal disorders, not elsewhere classified: Secondary | ICD-10-CM

## 2021-03-13 ENCOUNTER — Other Ambulatory Visit: Payer: Self-pay | Admitting: Family Medicine

## 2021-03-13 ENCOUNTER — Encounter: Payer: Self-pay | Admitting: Family Medicine

## 2021-03-13 DIAGNOSIS — M47812 Spondylosis without myelopathy or radiculopathy, cervical region: Secondary | ICD-10-CM | POA: Diagnosis not present

## 2021-03-13 MED ORDER — ALPRAZOLAM 1 MG PO TABS: 0.5000 mg | ORAL_TABLET | Freq: Every day | ORAL | 1 refills | Status: DC | PRN

## 2021-03-26 DIAGNOSIS — M542 Cervicalgia: Secondary | ICD-10-CM | POA: Diagnosis not present

## 2021-03-26 DIAGNOSIS — R269 Unspecified abnormalities of gait and mobility: Secondary | ICD-10-CM | POA: Diagnosis not present

## 2021-03-26 DIAGNOSIS — M25552 Pain in left hip: Secondary | ICD-10-CM | POA: Diagnosis not present

## 2021-03-26 DIAGNOSIS — M25551 Pain in right hip: Secondary | ICD-10-CM | POA: Diagnosis not present

## 2021-03-28 ENCOUNTER — Other Ambulatory Visit: Payer: BC Managed Care – PPO

## 2021-04-01 DIAGNOSIS — K601 Chronic anal fissure: Secondary | ICD-10-CM | POA: Diagnosis not present

## 2021-04-01 DIAGNOSIS — K5641 Fecal impaction: Secondary | ICD-10-CM | POA: Diagnosis not present

## 2021-04-01 DIAGNOSIS — M47812 Spondylosis without myelopathy or radiculopathy, cervical region: Secondary | ICD-10-CM | POA: Diagnosis not present

## 2021-04-02 ENCOUNTER — Encounter: Payer: Self-pay | Admitting: Family Medicine

## 2021-04-02 ENCOUNTER — Other Ambulatory Visit: Payer: Self-pay

## 2021-04-02 ENCOUNTER — Ambulatory Visit (INDEPENDENT_AMBULATORY_CARE_PROVIDER_SITE_OTHER): Payer: BC Managed Care – PPO | Admitting: Family Medicine

## 2021-04-02 DIAGNOSIS — K603 Anal fistula: Secondary | ICD-10-CM | POA: Diagnosis not present

## 2021-04-02 DIAGNOSIS — R109 Unspecified abdominal pain: Secondary | ICD-10-CM | POA: Diagnosis not present

## 2021-04-02 DIAGNOSIS — R6881 Early satiety: Secondary | ICD-10-CM | POA: Diagnosis not present

## 2021-04-02 DIAGNOSIS — R1319 Other dysphagia: Secondary | ICD-10-CM | POA: Diagnosis not present

## 2021-04-02 DIAGNOSIS — M549 Dorsalgia, unspecified: Secondary | ICD-10-CM | POA: Diagnosis not present

## 2021-04-02 DIAGNOSIS — Z659 Problem related to unspecified psychosocial circumstances: Secondary | ICD-10-CM | POA: Diagnosis not present

## 2021-04-02 MED ORDER — TRAMADOL HCL 50 MG PO TABS: 50.0000 mg | ORAL_TABLET | Freq: Three times a day (TID) | ORAL | 1 refills | Status: DC | PRN

## 2021-04-02 MED ORDER — PREDNISONE 10 MG PO TABS: ORAL_TABLET | ORAL | 0 refills | Status: DC

## 2021-04-02 MED ORDER — ALPRAZOLAM 1 MG PO TABS: 0.5000 mg | ORAL_TABLET | Freq: Two times a day (BID) | ORAL | 1 refills | Status: DC | PRN

## 2021-04-02 NOTE — Progress Notes (Signed)
This visit occurred during the SARS-CoV-2 public health emergency.  Safety protocols were in place, including screening questions prior to the visit, additional usage of staff PPE, and extensive cleaning of exam room while observing appropriate contact time as indicated for disinfecting solutions.  He has CT pending re: SI pain.  He is still having more pain but only tolerates voltaren QD not BID due to GI upset.  He is taking it at night.  He can tolerate QD dosing, using it intermittently and not every day.    We talked about pain control options.  He has been going for massages.  He has been able to tolerate tramadol in the past.  Discussed with patient about routine tramadol cautions/use.  He had taken BZD prn for anxiety related to stressors (pain, dealing with insurance, political instability in [place] currently).  He is trying to deal with stressors (gardening, etc).  We talked about med use and routine cautions d/w pt.    Meds, vitals, and allergies reviewed.  ROS: Per HPI unless specifically indicated in ROS section   Nad Ncat Neck supple, no LA Rrr Ctab Abd soft, not ttp Right SI area tender to palpation. Able to bear weight.  Skin well perfused. Speech and affect normal.  Judgment intact.

## 2021-04-02 NOTE — Patient Instructions (Signed)
Stop diclofenac.  Prednisone with food. Tramadol if needed.  Xanax if needed.  Sedation caution.  Update me as needed.  Take care.  Glad to see you.

## 2021-04-03 ENCOUNTER — Ambulatory Visit
Admission: RE | Admit: 2021-04-03 | Discharge: 2021-04-03 | Disposition: A | Discharge status: Home / Self Care | Payer: BC Managed Care – PPO | Source: Ambulatory Visit | Attending: Orthopedic Surgery | Admitting: Orthopedic Surgery

## 2021-04-03 ENCOUNTER — Telehealth: Payer: Self-pay | Admitting: Family Medicine

## 2021-04-03 DIAGNOSIS — Z659 Problem related to unspecified psychosocial circumstances: Secondary | ICD-10-CM | POA: Insufficient documentation

## 2021-04-03 DIAGNOSIS — G8929 Other chronic pain: Secondary | ICD-10-CM | POA: Diagnosis not present

## 2021-04-03 DIAGNOSIS — M533 Sacrococcygeal disorders, not elsewhere classified: Secondary | ICD-10-CM | POA: Diagnosis not present

## 2021-04-03 NOTE — Telephone Encounter (Addendum)
[name] with [employer] called stating that the prescription for Tramadol 50mg  has been approved from 04/03/2021 to 06/2021.

## 2021-04-03 NOTE — Assessment & Plan Note (Signed)
With CT pending regarding SI pain.  Discussed options.  Stop diclofenac.  Reasonable to use prednisone with food.  Routine steroid cautions discussed, take with food.  He can use tramadol if needed.  Routine cautions discussed.  He will update me as needed.

## 2021-04-03 NOTE — Assessment & Plan Note (Signed)
See above.  Combination of pain, dealing with insurance company, and generalized political instability in the [place] all likely contribute.  He can use Xanax if needed.  Sedation caution.  Update me as needed.  He agrees with plan.

## 2021-04-04 ENCOUNTER — Encounter: Payer: Self-pay | Admitting: Family Medicine

## 2021-04-15 DIAGNOSIS — M533 Sacrococcygeal disorders, not elsewhere classified: Secondary | ICD-10-CM | POA: Diagnosis not present

## 2021-04-18 DIAGNOSIS — K603 Anal fistula: Secondary | ICD-10-CM | POA: Diagnosis not present

## 2021-04-18 DIAGNOSIS — N200 Calculus of kidney: Secondary | ICD-10-CM | POA: Diagnosis not present

## 2021-04-18 DIAGNOSIS — R109 Unspecified abdominal pain: Secondary | ICD-10-CM | POA: Diagnosis not present

## 2021-04-19 ENCOUNTER — Encounter: Payer: Self-pay | Admitting: Family Medicine

## 2021-04-19 ENCOUNTER — Telehealth (INDEPENDENT_AMBULATORY_CARE_PROVIDER_SITE_OTHER): Payer: BC Managed Care – PPO | Admitting: Family Medicine

## 2021-04-19 DIAGNOSIS — J069 Acute upper respiratory infection, unspecified: Secondary | ICD-10-CM

## 2021-04-19 MED ORDER — DOXYCYCLINE HYCLATE 100 MG PO TABS: 100.0000 mg | ORAL_TABLET | Freq: Two times a day (BID) | ORAL | 0 refills | Status: DC

## 2021-04-19 NOTE — Progress Notes (Signed)
Virtual visit completed through WebEx or similar program Patient location: home  Provider location: [place] at [facility], office  Participants: Patient and me (unless stated otherwise below)  Pandemic considerations d/w pt.   Limitations and rationale for visit method d/w patient.  Patient agreed to proceed.   CC: URI sx.   HPI:   He had travelled and likely had sick exposure there.  He had ST initially but that got better- that was last week.  Then had runny nose since Monday of this week.  Then had cough and congestion.  Started taking pseudophed in the meantime for chest congestion.  Temp this AM, 100 deg.  No fever now.  Taking theraful.  HA, body aches, fatigue Tuesday. Patient has taken 2 home covid tests and both were negative. Patient is concerned about pulse rate being up as well, could be exacerbated by pseudophed or phenylephrine.  He isn't SOB.  Still speaking in complete sentences.  No sinus pressure.  We talked about possible flu- but he is already past two day window.  He had prev flu shot.  He is low risk.  His sx are more manageable with OTC cold and flu medicine.  Recheck pulse 104 at time of visit.    We talked his xanax rx and he hasn't run out and he'll check on getting it filled when needed.   Meds and allergies reviewed.   ROS: Per HPI unless specifically indicated in ROS section   NAD Speech wnl  A/P: URI.  Nontoxic.  Okay for outpatient follow-up.  We talked about options.  Supportive care in the meantime.  If he has progressive symptoms with more facial pain then it would be reasonable to treat him for sinusitis. He can tolerate doxycycline, d/w pt.  Hold rx for now.  He can use it if he has progressive symptoms.  His tachycardia could be related to cold medication and if this persists he can update me but I expect it to resolve.

## 2021-04-21 NOTE — Assessment & Plan Note (Signed)
URI.  Nontoxic.  Okay for outpatient follow-up.  We talked about options.  Supportive care in the meantime.  If he has progressive symptoms with more facial pain then it would be reasonable to treat him for sinusitis. He can tolerate doxycycline, d/w pt.  Hold rx for now.  He can use it if he has progressive symptoms.  His tachycardia could be related to cold medication and if this persists he can update me but I expect it to resolve.

## 2021-04-26 ENCOUNTER — Encounter: Payer: Self-pay | Admitting: Family Medicine

## 2021-04-29 ENCOUNTER — Telehealth: Payer: Self-pay | Admitting: Family Medicine

## 2021-04-29 NOTE — Telephone Encounter (Signed)
Please check with patient.  See mychart message.  I need interval update since that was from 3 days ago.  Thanks.

## 2021-04-29 NOTE — Telephone Encounter (Signed)
See my chart message

## 2021-04-30 ENCOUNTER — Other Ambulatory Visit: Payer: Self-pay | Admitting: Family Medicine

## 2021-04-30 MED ORDER — AZITHROMYCIN 250 MG PO TABS: ORAL_TABLET | ORAL | 0 refills | Status: AC

## 2021-04-30 MED ORDER — ALBUTEROL SULFATE HFA 108 (90 BASE) MCG/ACT IN AERS: 1.0000 | INHALATION_SPRAY | Freq: Four times a day (QID) | RESPIRATORY_TRACT | 0 refills | Status: DC | PRN

## 2021-04-30 NOTE — Telephone Encounter (Signed)
Spoke with patient. He is still having a lot of fatigue; white sputum no discoloration; slight cough but not productive; chest congestion; shallow breathing and feels like he has a weight on his chest; has a slight wheeze at times. He has been taking the theraflu since he stopped taking the doxycycline which helps some with his sx except for the restricted/pressure feeling in this chest.

## 2021-04-30 NOTE — Telephone Encounter (Signed)
Please call pt.  I updated his chart re: presumed allergy/rash with doxy.  If not running a fever now and the main issue is chest congestion and or cough, then reasonable to try albuterol.  If still with discolored sputum or rhinorrhea, then let me know as we may need to send another abx.  Thanks.

## 2021-04-30 NOTE — Telephone Encounter (Signed)
LMTCB

## 2021-05-02 ENCOUNTER — Ambulatory Visit: Payer: BC Managed Care – PPO | Admitting: Family Medicine

## 2021-05-02 DIAGNOSIS — K432 Incisional hernia without obstruction or gangrene: Secondary | ICD-10-CM | POA: Diagnosis not present

## 2021-05-06 ENCOUNTER — Encounter: Payer: Self-pay | Admitting: Family Medicine

## 2021-05-06 DIAGNOSIS — M542 Cervicalgia: Secondary | ICD-10-CM

## 2021-05-08 DIAGNOSIS — M542 Cervicalgia: Secondary | ICD-10-CM | POA: Diagnosis not present

## 2021-05-10 DIAGNOSIS — M542 Cervicalgia: Secondary | ICD-10-CM | POA: Diagnosis not present

## 2021-05-10 DIAGNOSIS — M25612 Stiffness of left shoulder, not elsewhere classified: Secondary | ICD-10-CM | POA: Diagnosis not present

## 2021-05-10 DIAGNOSIS — M25611 Stiffness of right shoulder, not elsewhere classified: Secondary | ICD-10-CM | POA: Diagnosis not present

## 2021-05-10 DIAGNOSIS — R29898 Other symptoms and signs involving the musculoskeletal system: Secondary | ICD-10-CM | POA: Diagnosis not present

## 2021-05-13 DIAGNOSIS — M542 Cervicalgia: Secondary | ICD-10-CM | POA: Diagnosis not present

## 2021-05-14 DIAGNOSIS — M461 Sacroiliitis, not elsewhere classified: Secondary | ICD-10-CM | POA: Diagnosis not present

## 2021-05-28 ENCOUNTER — Other Ambulatory Visit: Payer: Self-pay | Admitting: Family Medicine

## 2021-05-29 DIAGNOSIS — M542 Cervicalgia: Secondary | ICD-10-CM | POA: Diagnosis not present

## 2021-05-31 DIAGNOSIS — K603 Anal fistula: Secondary | ICD-10-CM | POA: Diagnosis not present

## 2021-05-31 DIAGNOSIS — K529 Noninfective gastroenteritis and colitis, unspecified: Secondary | ICD-10-CM | POA: Diagnosis not present

## 2021-05-31 DIAGNOSIS — R109 Unspecified abdominal pain: Secondary | ICD-10-CM | POA: Diagnosis not present

## 2021-05-31 DIAGNOSIS — R197 Diarrhea, unspecified: Secondary | ICD-10-CM | POA: Diagnosis not present

## 2021-05-31 DIAGNOSIS — Z86010 Personal history of colonic polyps: Secondary | ICD-10-CM | POA: Diagnosis not present

## 2021-05-31 DIAGNOSIS — R1310 Dysphagia, unspecified: Secondary | ICD-10-CM | POA: Diagnosis not present

## 2021-05-31 DIAGNOSIS — K219 Gastro-esophageal reflux disease without esophagitis: Secondary | ICD-10-CM | POA: Diagnosis not present

## 2021-05-31 DIAGNOSIS — K209 Esophagitis, unspecified without bleeding: Secondary | ICD-10-CM | POA: Diagnosis not present

## 2021-05-31 DIAGNOSIS — K208 Other esophagitis without bleeding: Secondary | ICD-10-CM | POA: Diagnosis not present

## 2021-05-31 DIAGNOSIS — K648 Other hemorrhoids: Secondary | ICD-10-CM | POA: Diagnosis not present

## 2021-05-31 LAB — HM COLONOSCOPY

## 2021-06-05 ENCOUNTER — Other Ambulatory Visit: Payer: Self-pay | Admitting: Family Medicine

## 2021-06-07 ENCOUNTER — Encounter: Payer: Self-pay | Admitting: Physical Therapy

## 2021-06-07 ENCOUNTER — Encounter (INDEPENDENT_AMBULATORY_CARE_PROVIDER_SITE_OTHER): Payer: Self-pay

## 2021-06-07 DIAGNOSIS — M4692 Unspecified inflammatory spondylopathy, cervical region: Secondary | ICD-10-CM | POA: Diagnosis not present

## 2021-06-07 NOTE — Telephone Encounter (Signed)
Refill request for Alprazolam 1 mg tablets  LOV - 04/19/21 Next OV - not scheduled Last refill - 04/02/21 #30/1

## 2021-06-13 DIAGNOSIS — K594 Anal spasm: Secondary | ICD-10-CM | POA: Diagnosis not present

## 2021-06-17 DIAGNOSIS — K601 Chronic anal fissure: Secondary | ICD-10-CM | POA: Diagnosis not present

## 2021-06-17 DIAGNOSIS — K603 Anal fistula: Secondary | ICD-10-CM | POA: Diagnosis not present

## 2021-06-18 DIAGNOSIS — K603 Anal fistula: Secondary | ICD-10-CM | POA: Diagnosis not present

## 2021-06-19 DIAGNOSIS — M47812 Spondylosis without myelopathy or radiculopathy, cervical region: Secondary | ICD-10-CM | POA: Diagnosis not present

## 2021-06-20 DIAGNOSIS — K603 Anal fistula: Secondary | ICD-10-CM | POA: Diagnosis not present

## 2021-06-20 DIAGNOSIS — K589 Irritable bowel syndrome without diarrhea: Secondary | ICD-10-CM | POA: Diagnosis not present

## 2021-06-24 ENCOUNTER — Ambulatory Visit: Payer: BC Managed Care – PPO | Admitting: Physical Therapy

## 2021-06-24 DIAGNOSIS — K603 Anal fistula: Secondary | ICD-10-CM | POA: Diagnosis not present

## 2021-06-25 DIAGNOSIS — Z9889 Other specified postprocedural states: Secondary | ICD-10-CM | POA: Diagnosis not present

## 2021-06-28 DIAGNOSIS — Z885 Allergy status to narcotic agent status: Secondary | ICD-10-CM | POA: Diagnosis not present

## 2021-06-28 DIAGNOSIS — T8130XA Disruption of wound, unspecified, initial encounter: Secondary | ICD-10-CM | POA: Diagnosis not present

## 2021-06-28 DIAGNOSIS — M1990 Unspecified osteoarthritis, unspecified site: Secondary | ICD-10-CM | POA: Diagnosis not present

## 2021-06-28 DIAGNOSIS — K6289 Other specified diseases of anus and rectum: Secondary | ICD-10-CM | POA: Diagnosis not present

## 2021-06-28 DIAGNOSIS — Z888 Allergy status to other drugs, medicaments and biological substances status: Secondary | ICD-10-CM | POA: Diagnosis not present

## 2021-06-28 DIAGNOSIS — K625 Hemorrhage of anus and rectum: Secondary | ICD-10-CM | POA: Diagnosis not present

## 2021-06-28 DIAGNOSIS — F419 Anxiety disorder, unspecified: Secondary | ICD-10-CM | POA: Diagnosis not present

## 2021-06-28 DIAGNOSIS — K611 Rectal abscess: Secondary | ICD-10-CM | POA: Diagnosis not present

## 2021-06-28 DIAGNOSIS — Z881 Allergy status to other antibiotic agents status: Secondary | ICD-10-CM | POA: Diagnosis not present

## 2021-06-28 DIAGNOSIS — Z882 Allergy status to sulfonamides status: Secondary | ICD-10-CM | POA: Diagnosis not present

## 2021-06-28 DIAGNOSIS — Z886 Allergy status to analgesic agent status: Secondary | ICD-10-CM | POA: Diagnosis not present

## 2021-06-28 DIAGNOSIS — K603 Anal fistula: Secondary | ICD-10-CM | POA: Diagnosis not present

## 2021-07-01 DIAGNOSIS — K603 Anal fistula: Secondary | ICD-10-CM | POA: Diagnosis not present

## 2021-07-01 DIAGNOSIS — Z09 Encounter for follow-up examination after completed treatment for conditions other than malignant neoplasm: Secondary | ICD-10-CM | POA: Diagnosis not present

## 2021-07-05 DIAGNOSIS — M47812 Spondylosis without myelopathy or radiculopathy, cervical region: Secondary | ICD-10-CM | POA: Diagnosis not present

## 2021-07-08 DIAGNOSIS — K603 Anal fistula: Secondary | ICD-10-CM | POA: Diagnosis not present

## 2021-07-15 DIAGNOSIS — K610 Anal abscess: Secondary | ICD-10-CM | POA: Diagnosis not present

## 2021-07-18 DIAGNOSIS — M47812 Spondylosis without myelopathy or radiculopathy, cervical region: Secondary | ICD-10-CM | POA: Diagnosis not present

## 2021-07-26 DIAGNOSIS — N200 Calculus of kidney: Secondary | ICD-10-CM | POA: Diagnosis not present

## 2021-07-29 DIAGNOSIS — K603 Anal fistula: Secondary | ICD-10-CM | POA: Diagnosis not present

## 2021-08-01 DIAGNOSIS — M47812 Spondylosis without myelopathy or radiculopathy, cervical region: Secondary | ICD-10-CM | POA: Diagnosis not present

## 2021-08-02 ENCOUNTER — Other Ambulatory Visit: Payer: Self-pay | Admitting: Family Medicine

## 2021-08-09 DIAGNOSIS — M461 Sacroiliitis, not elsewhere classified: Secondary | ICD-10-CM | POA: Diagnosis not present

## 2021-08-09 DIAGNOSIS — Z9889 Other specified postprocedural states: Secondary | ICD-10-CM | POA: Diagnosis not present

## 2021-08-14 DIAGNOSIS — K229 Disease of esophagus, unspecified: Secondary | ICD-10-CM | POA: Diagnosis not present

## 2021-08-14 DIAGNOSIS — R197 Diarrhea, unspecified: Secondary | ICD-10-CM | POA: Diagnosis not present

## 2021-08-14 DIAGNOSIS — R6881 Early satiety: Secondary | ICD-10-CM | POA: Diagnosis not present

## 2021-08-16 DIAGNOSIS — R197 Diarrhea, unspecified: Secondary | ICD-10-CM | POA: Diagnosis not present

## 2021-08-20 DIAGNOSIS — K2289 Other specified disease of esophagus: Secondary | ICD-10-CM | POA: Diagnosis not present

## 2021-08-20 DIAGNOSIS — K200 Eosinophilic esophagitis: Secondary | ICD-10-CM | POA: Diagnosis not present

## 2021-08-20 DIAGNOSIS — K209 Esophagitis, unspecified without bleeding: Secondary | ICD-10-CM | POA: Diagnosis not present

## 2021-08-20 DIAGNOSIS — K219 Gastro-esophageal reflux disease without esophagitis: Secondary | ICD-10-CM | POA: Diagnosis not present

## 2021-08-20 DIAGNOSIS — R6881 Early satiety: Secondary | ICD-10-CM | POA: Diagnosis not present

## 2021-08-20 DIAGNOSIS — K229 Disease of esophagus, unspecified: Secondary | ICD-10-CM | POA: Diagnosis not present

## 2021-08-21 DIAGNOSIS — K603 Anal fistula: Secondary | ICD-10-CM | POA: Diagnosis not present

## 2021-08-22 DIAGNOSIS — N200 Calculus of kidney: Secondary | ICD-10-CM | POA: Diagnosis not present

## 2021-09-02 DIAGNOSIS — M47812 Spondylosis without myelopathy or radiculopathy, cervical region: Secondary | ICD-10-CM | POA: Diagnosis not present

## 2021-09-03 DIAGNOSIS — R6881 Early satiety: Secondary | ICD-10-CM | POA: Diagnosis not present

## 2021-09-03 DIAGNOSIS — R110 Nausea: Secondary | ICD-10-CM | POA: Diagnosis not present

## 2021-09-04 DIAGNOSIS — N200 Calculus of kidney: Secondary | ICD-10-CM | POA: Diagnosis not present

## 2021-09-04 DIAGNOSIS — K610 Anal abscess: Secondary | ICD-10-CM | POA: Diagnosis not present

## 2021-09-04 DIAGNOSIS — K603 Anal fistula: Secondary | ICD-10-CM | POA: Diagnosis not present

## 2021-09-04 DIAGNOSIS — Z09 Encounter for follow-up examination after completed treatment for conditions other than malignant neoplasm: Secondary | ICD-10-CM | POA: Diagnosis not present

## 2021-09-12 DIAGNOSIS — H938X1 Other specified disorders of right ear: Secondary | ICD-10-CM | POA: Diagnosis not present

## 2021-09-17 ENCOUNTER — Ambulatory Visit (INDEPENDENT_AMBULATORY_CARE_PROVIDER_SITE_OTHER): Payer: BC Managed Care – PPO | Admitting: Family Medicine

## 2021-09-17 ENCOUNTER — Encounter: Payer: Self-pay | Admitting: Family Medicine

## 2021-09-17 VITALS — BP 138/72 | HR 77 | Temp 98.0°F | Ht 67.0 in | Wt 183.0 lb

## 2021-09-17 DIAGNOSIS — M26629 Arthralgia of temporomandibular joint, unspecified side: Secondary | ICD-10-CM

## 2021-09-17 DIAGNOSIS — F419 Anxiety disorder, unspecified: Secondary | ICD-10-CM

## 2021-09-17 MED ORDER — PREDNISONE 20 MG PO TABS: 20.0000 mg | ORAL_TABLET | Freq: Two times a day (BID) | ORAL | Status: DC

## 2021-09-17 MED ORDER — ALPRAZOLAM 1 MG PO TABS: 0.5000 mg | ORAL_TABLET | Freq: Two times a day (BID) | ORAL | 1 refills | Status: DC | PRN

## 2021-09-17 NOTE — Progress Notes (Signed)
Taking xanax prn for anxiety.  Insomnia and worry noted.  He had been through a lot with abdominal surgery and fistula, then TMJ pain.  No SI/HI.  Still okay for outpatient follow-up.  Discussed setting up counseling. ? ?Cervical RF ablation helped his neck and headaches.  He had been seeing [facility] and that helped.   ? ?TMJ pain after recent dental visit.  Currently on prednisone.  He has dental f/u pending.  Prednisone helped more than ibuprofen.  He is limiting chewing, etc.    ? ?He had f/u re: perianal fistula and has f/u pending.   ? ?He was able to tolerate doxy in the meantime w/o rash.  He has tolerated morphine, d/w pt.   ? ?Meds, vitals, and allergies reviewed.  ? ?ROS: Per HPI unless specifically indicated in ROS section  ? ?GEN: nad, alert and oriented ?HEENT: ncat ?NECK: supple w/o LA ?CV: rrr.  ?PULM: ctab, no inc wob ?ABD: soft, +bs ?EXT: well perfused.  ? ?30 minutes were devoted to patient care in this encounter (this includes time spent reviewing the patient's file/history, interviewing and examining the patient, counseling/reviewing plan with patient).  ? ?

## 2021-09-17 NOTE — Patient Instructions (Addendum)
Let me know how it goes with the prednisone taper.   ?I'll await the GI and surgery notes.  ?Take care.  Glad to see you.] ? ?Let me see about counseling and med options (ie potentially buspar). ?

## 2021-09-19 ENCOUNTER — Telehealth: Payer: Self-pay | Admitting: Family Medicine

## 2021-09-19 MED ORDER — TRIAMCINOLONE ACETONIDE 55 MCG/ACT NA AERO: 2.0000 | INHALATION_SPRAY | Freq: Every day | NASAL | Status: DC

## 2021-09-19 MED ORDER — BUSPIRONE HCL 5 MG PO TABS: 5.0000 mg | ORAL_TABLET | Freq: Two times a day (BID) | ORAL | 1 refills | Status: DC

## 2021-09-19 NOTE — Assessment & Plan Note (Signed)
Routine cautions given to patient he is trying to taper his prednisone.  He can update me as needed. ?

## 2021-09-19 NOTE — Telephone Encounter (Signed)
Patient notified referral was done and rx was sent. Patient verbalized understanding and thanks [place] for all we do.  ?

## 2021-09-19 NOTE — Assessment & Plan Note (Signed)
With multiple recent stressors noted.  Continue prn xanax in the meantime.  Sedation caution discussed with patient.  See follow-up phone note regarding other med options.  Refer for counseling.  Still okay for outpatient follow-up. ?

## 2021-09-19 NOTE — Telephone Encounter (Signed)
Please update patient.  I put in the referral for counseling in [facility] and I think it makes sense to start taking BuSpar.  I would start with 5 mg twice a day.  He can still use Xanax if needed.  Please update me as needed.  Thanks. ?

## 2021-09-26 DIAGNOSIS — K200 Eosinophilic esophagitis: Secondary | ICD-10-CM | POA: Diagnosis not present

## 2021-09-26 DIAGNOSIS — K603 Anal fistula: Secondary | ICD-10-CM | POA: Diagnosis not present

## 2021-09-26 DIAGNOSIS — K30 Functional dyspepsia: Secondary | ICD-10-CM | POA: Diagnosis not present

## 2021-09-30 DIAGNOSIS — M47812 Spondylosis without myelopathy or radiculopathy, cervical region: Secondary | ICD-10-CM | POA: Diagnosis not present

## 2021-10-03 ENCOUNTER — Ambulatory Visit: Payer: BC Managed Care – PPO | Admitting: Family Medicine

## 2021-10-03 ENCOUNTER — Encounter: Payer: Self-pay | Admitting: Family Medicine

## 2021-10-03 DIAGNOSIS — M26629 Arthralgia of temporomandibular joint, unspecified side: Secondary | ICD-10-CM

## 2021-10-03 MED ORDER — METHOCARBAMOL 500 MG PO TABS: 500.0000 mg | ORAL_TABLET | Freq: Four times a day (QID) | ORAL | 0 refills | Status: DC | PRN

## 2021-10-03 NOTE — Patient Instructions (Addendum)
I would try robaxin in the meantime and stop tizanidine.  ?Gently stretch and massage.   ?Ask the dentist about a mouth guard.   ?Take care.  Glad to see you. ?

## 2021-10-03 NOTE — Progress Notes (Signed)
Still with R sided TMJ pain.  S/p injection with botox.  Done with prednisone, but that didn't help a lot.  He felt irritable on the higher dose of prednisone.  Started tizanidine but had fatigue/sedation with that. Advil helps some.  D/w pt about trying icing.  He has been trying to massage the muscles in the area.  ? ?Pain with jaw opening. More talking---->more pain.  Less pain upon waking in the AM.  More pain with chewing.   ? ?Meds, vitals, and allergies reviewed.  ? ?ROS: Per HPI unless specifically indicated in ROS section  ? ?Nad ?Ncat ?Neck supple, no LA ?On exam I felt a muscle spasm under the skin in the R side of the face on the cheek, it rolled under my finger with palpation.  No redness.  No bruising. ?

## 2021-10-06 NOTE — Assessment & Plan Note (Signed)
Now with likely associated muscle spasm.  Discussed options ?I would try robaxin in the meantime and stop tizanidine.  ?Gently stretch and massage.   ?I asked him to check with the dentist about a mouth guard.   ?He will update me as needed. ?

## 2021-10-18 ENCOUNTER — Encounter: Payer: Self-pay | Admitting: Family Medicine

## 2021-10-18 ENCOUNTER — Ambulatory Visit: Payer: BC Managed Care – PPO | Admitting: Family Medicine

## 2021-10-18 VITALS — BP 130/80 | HR 71 | Temp 97.8°F | Ht 67.0 in | Wt 184.0 lb

## 2021-10-18 DIAGNOSIS — R519 Headache, unspecified: Secondary | ICD-10-CM

## 2021-10-18 DIAGNOSIS — M26621 Arthralgia of right temporomandibular joint: Secondary | ICD-10-CM

## 2021-10-18 MED ORDER — ALPRAZOLAM 1 MG PO TABS: 0.5000 mg | ORAL_TABLET | Freq: Two times a day (BID) | ORAL | 1 refills | Status: DC | PRN

## 2021-10-18 NOTE — Patient Instructions (Signed)
Use ibuprofen if needed and we'll call about the CT.   Take care.  Glad to see you.

## 2021-10-18 NOTE — Progress Notes (Signed)
All of his sx started after the dental work.  He still has a knot on the R side of the face.  Ttp locally.  Still having R sided pain, less than prior but not resolved.  He feels on edge from the pain.  No TMJ pain currently.  The pain is in the cheek.  Saw dental clinic recently.  He tried eating sour candy w/o relief. He doesn't have locally puffiness at the parotid.  Limiting talking and chewing helps.  More pain as the day goes on.  Advil helps some.  He noted some tingling on the fingers after using buspar but tolerated the medication otherwise.    Meds, vitals, and allergies reviewed.   ROS: Per HPI unless specifically indicated in ROS section   Nad Ncat Neck supple no LA He doesn't have R V1 pain on exam.   Small palpable area on R cheek but no obvious parotid swelling.   R TMJ not ttp but he has limited TMJ ROM from pain at time of exam.

## 2021-10-20 NOTE — Assessment & Plan Note (Signed)
Discussed options.  All of his symptoms started after dental work It is unclear if he is have some symptoms from muscle spasm vs parotid source vs trigeminal neuralgia.  Sx going on for 2 months, and at this point it is reasonable to get imaging done.  He did not have improvement in symptoms with eating sour candy in an effort to decompress the parotid gland.  He still okay for outpatient follow-up but I think it makes sense to get imaging done and then go from there.  He agrees to plan.  CT with contrast ordered.

## 2021-10-24 ENCOUNTER — Ambulatory Visit
Admission: RE | Admit: 2021-10-24 | Discharge: 2021-10-24 | Disposition: A | Discharge status: Home / Self Care | Payer: BC Managed Care – PPO | Source: Ambulatory Visit | Attending: Family Medicine | Admitting: Family Medicine

## 2021-10-24 DIAGNOSIS — M26621 Arthralgia of right temporomandibular joint: Secondary | ICD-10-CM

## 2021-10-24 DIAGNOSIS — R519 Headache, unspecified: Secondary | ICD-10-CM

## 2021-10-24 MED ORDER — IOPAMIDOL (ISOVUE-300) INJECTION 61%
80.0000 mL | Freq: Once | INTRAVENOUS | Status: AC | PRN
  Administered 2021-10-24: 80 mL via INTRAVENOUS

## 2021-10-25 ENCOUNTER — Ambulatory Visit: Payer: BC Managed Care – PPO | Admitting: Psychologist

## 2021-10-30 ENCOUNTER — Encounter: Payer: Self-pay | Admitting: Family Medicine

## 2021-11-07 ENCOUNTER — Encounter: Payer: Self-pay | Admitting: Family Medicine

## 2021-11-07 DIAGNOSIS — R519 Headache, unspecified: Secondary | ICD-10-CM

## 2021-11-08 ENCOUNTER — Other Ambulatory Visit: Payer: Self-pay | Admitting: Family Medicine

## 2021-11-08 DIAGNOSIS — R519 Headache, unspecified: Secondary | ICD-10-CM

## 2021-11-14 DIAGNOSIS — G500 Trigeminal neuralgia: Secondary | ICD-10-CM | POA: Diagnosis not present

## 2021-11-25 ENCOUNTER — Encounter: Payer: Self-pay | Admitting: Family Medicine

## 2021-11-25 ENCOUNTER — Ambulatory Visit: Payer: BC Managed Care – PPO | Admitting: Family Medicine

## 2021-11-25 VITALS — BP 122/82 | HR 79 | Temp 98.2°F | Ht 67.0 in | Wt 192.0 lb

## 2021-11-25 DIAGNOSIS — R519 Headache, unspecified: Secondary | ICD-10-CM

## 2021-11-25 LAB — CBC WITH DIFFERENTIAL/PLATELET
Basophils Absolute: 0 10*3/uL (ref 0.0–0.1)
Basophils Relative: 0.7 % (ref 0.0–3.0)
Eosinophils Absolute: 0.5 10*3/uL (ref 0.0–0.7)
Eosinophils Relative: 9.5 % — ABNORMAL HIGH (ref 0.0–5.0)
HCT: 49.5 % (ref 39.0–52.0)
Hemoglobin: 17 g/dL (ref 13.0–17.0)
Lymphocytes Relative: 29.9 % (ref 12.0–46.0)
Lymphs Abs: 1.7 10*3/uL (ref 0.7–4.0)
MCHC: 34.4 g/dL (ref 30.0–36.0)
MCV: 92.9 fl (ref 78.0–100.0)
Monocytes Absolute: 0.8 10*3/uL (ref 0.1–1.0)
Monocytes Relative: 13.6 % — ABNORMAL HIGH (ref 3.0–12.0)
Neutro Abs: 2.6 10*3/uL (ref 1.4–7.7)
Neutrophils Relative %: 46.3 % (ref 43.0–77.0)
Platelets: 209 10*3/uL (ref 150.0–400.0)
RBC: 5.32 Mil/uL (ref 4.22–5.81)
RDW: 13.1 % (ref 11.5–15.5)
WBC: 5.6 10*3/uL (ref 4.0–10.5)

## 2021-11-25 LAB — COMPREHENSIVE METABOLIC PANEL
ALT: 20 U/L (ref 0–53)
AST: 21 U/L (ref 0–37)
Albumin: 4.6 g/dL (ref 3.5–5.2)
Alkaline Phosphatase: 79 U/L (ref 39–117)
BUN: 12 mg/dL (ref 6–23)
CO2: 31 mEq/L (ref 19–32)
Calcium: 9.8 mg/dL (ref 8.4–10.5)
Chloride: 103 mEq/L (ref 96–112)
Creatinine, Ser: 1.02 mg/dL (ref 0.40–1.50)
GFR: 84.89 mL/min (ref >60.00)
Glucose, Bld: 86 mg/dL (ref 70–99)
Potassium: 4.6 mEq/L (ref 3.5–5.1)
Sodium: 142 mEq/L (ref 135–145)
Total Bilirubin: 1.1 mg/dL (ref 0.2–1.2)
Total Protein: 6.9 g/dL (ref 6.0–8.3)

## 2021-11-25 MED ORDER — GABAPENTIN 300 MG PO CAPS: ORAL_CAPSULE | ORAL | Status: DC

## 2021-11-25 MED ORDER — CARBAMAZEPINE ER 100 MG PO TB12: 100.0000 mg | ORAL_TABLET | Freq: Two times a day (BID) | ORAL | 1 refills | Status: DC

## 2021-11-25 NOTE — Progress Notes (Signed)
D/w pt about pending eval with Dr. Arlan Organ and also about recent neuro eval.    He initially had a knot and pain locally.  He now has more nerve pain over the R side of the face.  He has taken gabapentin and ibuprofen in the meantime.  He thought gabapentin slowed his speech.  D/w pt about med options.  R face with hypersensitivity.  Clearly asymmetric from the L side of the face.  He has V1 through V3 sensation changes on the R side.    He is taking '600mg'$  gabapentin a day, in total.    Meds, vitals, and allergies reviewed.   ROS: Per HPI unless specifically indicated in ROS section   Nad Ncat Cleared with asymmetric sensation on the right side of the face but it does not cross the midline.  No lip droop.  Normal speech. Neck supple, no LA Rrr Ctab  30 minutes were devoted to patient care in this encounter (this includes time spent reviewing the patient's file/history, interviewing and examining the patient, counseling/reviewing plan with patient).

## 2021-11-26 ENCOUNTER — Other Ambulatory Visit: Payer: Self-pay | Admitting: Family Medicine

## 2021-11-27 NOTE — Assessment & Plan Note (Signed)
Presumed trigeminal neuralgia.  Discussed with patient about options.  Taper gabapentin given that he had some slowing of speech noted on that.  He does not have slurred speech at office visit.  No symptoms concerning for CVA.  Decrease gabapentin to 300 mg daily for 4 days then stop.  At that point he can start carbamazepine 100 mg twice a day.  We talked about routine cautions with that medication and the hope is that he will be able to tolerate carbamazepine without increase in anxiety.  I still want him to follow-up with neurology and referral has been placed.  He agrees with plan.  He will let me know how he is doing in a few days, sooner as needed.  See after visit summary.

## 2021-11-28 ENCOUNTER — Other Ambulatory Visit: Payer: Self-pay | Admitting: Family Medicine

## 2021-11-28 ENCOUNTER — Encounter: Payer: Self-pay | Admitting: Family Medicine

## 2021-11-28 DIAGNOSIS — R519 Headache, unspecified: Secondary | ICD-10-CM

## 2021-11-28 MED ORDER — CARBAMAZEPINE ER 100 MG PO TB12: 100.0000 mg | ORAL_TABLET | Freq: Two times a day (BID) | ORAL | 1 refills | Status: DC

## 2021-11-28 NOTE — Telephone Encounter (Signed)
Patient called to make sure you knew it was the [facility] on [address] [place], [place] [address]

## 2021-12-04 DIAGNOSIS — K603 Anal fistula: Secondary | ICD-10-CM | POA: Diagnosis not present

## 2021-12-04 DIAGNOSIS — K200 Eosinophilic esophagitis: Secondary | ICD-10-CM | POA: Diagnosis not present

## 2021-12-18 DIAGNOSIS — M26629 Arthralgia of temporomandibular joint, unspecified side: Secondary | ICD-10-CM | POA: Diagnosis not present

## 2021-12-18 DIAGNOSIS — G501 Atypical facial pain: Secondary | ICD-10-CM | POA: Diagnosis not present

## 2021-12-18 DIAGNOSIS — R519 Headache, unspecified: Secondary | ICD-10-CM | POA: Diagnosis not present

## 2021-12-20 DIAGNOSIS — L570 Actinic keratosis: Secondary | ICD-10-CM | POA: Diagnosis not present

## 2021-12-23 ENCOUNTER — Encounter: Payer: Self-pay | Admitting: Family Medicine

## 2021-12-23 ENCOUNTER — Other Ambulatory Visit: Payer: Self-pay | Admitting: Family Medicine

## 2021-12-23 DIAGNOSIS — R519 Headache, unspecified: Secondary | ICD-10-CM

## 2021-12-26 DIAGNOSIS — K200 Eosinophilic esophagitis: Secondary | ICD-10-CM | POA: Diagnosis not present

## 2021-12-26 DIAGNOSIS — K594 Anal spasm: Secondary | ICD-10-CM | POA: Diagnosis not present

## 2022-01-02 ENCOUNTER — Encounter: Payer: Self-pay | Admitting: Family Medicine

## 2022-01-02 ENCOUNTER — Ambulatory Visit: Payer: BC Managed Care – PPO | Admitting: Family Medicine

## 2022-01-02 VITALS — BP 134/70 | HR 95 | Temp 98.0°F | Ht 67.0 in | Wt 190.0 lb

## 2022-01-02 DIAGNOSIS — R519 Headache, unspecified: Secondary | ICD-10-CM

## 2022-01-02 DIAGNOSIS — R21 Rash and other nonspecific skin eruption: Secondary | ICD-10-CM

## 2022-01-02 NOTE — Progress Notes (Signed)
Facial rash.  D/w pt about prev effudex use.  He had used in in the area.  That is likely the issue.  He stopped the medicine in the meantime.    He had covid, he has improved in the meantime. Tested positive >2 weeks ago.  Neg test in the meantime.  Still with some HA. Prev with ST, congestion and fever.  Was treated with paxlovid and that likely helped with fever.    He had GI f/u done and is considering EGD re: possible EoE.  He had seen neurosurgery and has ENT f/u pending.  He may end up having MRI scheduled per outside clinic.  He'll update me as needed.  D/w pt that I can order if needed but I'll await update from patient.    Meds, vitals, and allergies reviewed.   ROS: Per HPI unless specifically indicated in ROS section   GEN: nad, alert and oriented HEENT: ncat NECK: supple w/o LA CV: rrr. PULM: ctab, no inc wob ABD: soft, +bs EXT: no edema SKIN: irritation inferior to R orbit.

## 2022-01-02 NOTE — Patient Instructions (Addendum)
I'll await the ENT report.  Let me know if you need help getting the MRI set up.  Take care.  Glad to see you. I would expect the facial irritation to resolved, likely from effudex.

## 2022-01-03 ENCOUNTER — Ambulatory Visit: Payer: BC Managed Care – PPO | Admitting: Cardiology

## 2022-01-03 NOTE — Assessment & Plan Note (Signed)
Likely related to Efudex use and not shingles.  He will update me as needed.

## 2022-01-03 NOTE — Assessment & Plan Note (Signed)
He is going to follow-up with ENT and stated plan in the notes is to have MRI ordered through neurology.  If he cannot get that done then he can let me know.  I will await update from patient.

## 2022-01-07 DIAGNOSIS — R221 Localized swelling, mass and lump, neck: Secondary | ICD-10-CM | POA: Diagnosis not present

## 2022-01-07 DIAGNOSIS — G500 Trigeminal neuralgia: Secondary | ICD-10-CM | POA: Diagnosis not present

## 2022-01-09 ENCOUNTER — Other Ambulatory Visit: Payer: Self-pay | Admitting: Family Medicine

## 2022-01-09 ENCOUNTER — Encounter: Payer: Self-pay | Admitting: Family Medicine

## 2022-01-09 DIAGNOSIS — R519 Headache, unspecified: Secondary | ICD-10-CM

## 2022-01-14 DIAGNOSIS — R220 Localized swelling, mass and lump, head: Secondary | ICD-10-CM | POA: Diagnosis not present

## 2022-01-14 DIAGNOSIS — R221 Localized swelling, mass and lump, neck: Secondary | ICD-10-CM | POA: Diagnosis not present

## 2022-01-15 DIAGNOSIS — G44209 Tension-type headache, unspecified, not intractable: Secondary | ICD-10-CM | POA: Diagnosis not present

## 2022-01-15 DIAGNOSIS — G244 Idiopathic orofacial dystonia: Secondary | ICD-10-CM | POA: Diagnosis not present

## 2022-01-15 DIAGNOSIS — F458 Other somatoform disorders: Secondary | ICD-10-CM | POA: Diagnosis not present

## 2022-01-15 DIAGNOSIS — M778 Other enthesopathies, not elsewhere classified: Secondary | ICD-10-CM | POA: Diagnosis not present

## 2022-01-24 ENCOUNTER — Ambulatory Visit (INDEPENDENT_AMBULATORY_CARE_PROVIDER_SITE_OTHER): Payer: BC Managed Care – PPO | Admitting: Family Medicine

## 2022-01-24 ENCOUNTER — Encounter: Payer: Self-pay | Admitting: Family Medicine

## 2022-01-24 DIAGNOSIS — R519 Headache, unspecified: Secondary | ICD-10-CM | POA: Diagnosis not present

## 2022-01-24 DIAGNOSIS — F419 Anxiety disorder, unspecified: Secondary | ICD-10-CM

## 2022-01-24 DIAGNOSIS — R21 Rash and other nonspecific skin eruption: Secondary | ICD-10-CM | POA: Diagnosis not present

## 2022-01-24 MED ORDER — ALPRAZOLAM 1 MG PO TABS: 0.5000 mg | ORAL_TABLET | Freq: Two times a day (BID) | ORAL | 1 refills | Status: DC | PRN

## 2022-01-24 MED ORDER — AZELASTINE HCL 0.1 % NA SOLN: 1.0000 | Freq: Two times a day (BID) | NASAL | 2 refills | Status: DC

## 2022-01-24 MED ORDER — TRAMADOL HCL 50 MG PO TABS: 25.0000 mg | ORAL_TABLET | Freq: Two times a day (BID) | ORAL | 1 refills | Status: DC | PRN

## 2022-01-24 MED ORDER — GABAPENTIN 300 MG PO CAPS: ORAL_CAPSULE | ORAL | Status: DC

## 2022-01-24 NOTE — Patient Instructions (Signed)
Update me as needed.  The rash should resolve.   I'll await neurology input.  Take care.  Glad to see you.

## 2022-01-24 NOTE — Progress Notes (Unsigned)
MRI d/w pt.   Normal MRI of the neck. No abnormal finding is seen in the region of  the marker in the right cheek. There is no inflammatory change or  suspicious mass lesion in the right parotid gland which is located  posterior to the marker. The underlying masseter muscle is normal.   R sided facial pain at baseline with occ flare of pain.  He is tapering down on gabapentin.  He had injection per outside clinic but nerve pain continued.  More pain with local massage.  Methocarbamol/tramadol/gabapentin help.  He gets more numbness on the R face with prolonged talking.    He is travelling to [place] next month.  He is going to f/u with neurology after that.    Rash.  Currently on prednisone.  Rash noted after covid.  Prev on the face, then the back of the neck, then the arms, would blister.  Was bilateral.  It appears that prednisone significantly helped.  Meds, vitals, and allergies reviewed.   ROS: Per HPI unless specifically indicated in ROS section   GEN: nad, alert and oriented HEENT: ncat NECK: supple w/o LA CV: rrr.  PULM: ctab, no inc wob ABD: soft, +bs EXT: no edema SKIN: Faint resolving rash on the B extensor arms.   Small knot felt on R facial cheek.

## 2022-01-26 NOTE — Assessment & Plan Note (Signed)
Okay to use Xanax as needed.

## 2022-01-26 NOTE — Assessment & Plan Note (Signed)
Should resolve.  Finish prednisone and update me as needed.

## 2022-01-26 NOTE — Assessment & Plan Note (Signed)
Continue methocarbamol/tramadol/gabapentin for now.  He is tapering gabapentin.  He is on follow-up with neurology.

## 2022-02-25 DIAGNOSIS — K611 Rectal abscess: Secondary | ICD-10-CM | POA: Diagnosis not present

## 2022-03-05 DIAGNOSIS — G500 Trigeminal neuralgia: Secondary | ICD-10-CM | POA: Diagnosis not present

## 2022-03-13 ENCOUNTER — Encounter: Payer: Self-pay | Admitting: Cardiology

## 2022-03-19 ENCOUNTER — Ambulatory Visit: Payer: BC Managed Care – PPO | Admitting: Cardiology

## 2022-03-24 DIAGNOSIS — K603 Anal fistula: Secondary | ICD-10-CM | POA: Diagnosis not present

## 2022-03-24 DIAGNOSIS — R202 Paresthesia of skin: Secondary | ICD-10-CM | POA: Diagnosis not present

## 2022-03-24 DIAGNOSIS — R519 Headache, unspecified: Secondary | ICD-10-CM | POA: Diagnosis not present

## 2022-03-24 DIAGNOSIS — Z79899 Other long term (current) drug therapy: Secondary | ICD-10-CM | POA: Diagnosis not present

## 2022-03-24 NOTE — Progress Notes (Signed)
Cardiology Office Note:    Date:  03/25/2022   ID:  [name], DOB 03-25-1971, MRN [id number]  PCP:  Tonia Ghent, MD   [facility] Providers Cardiologist:  Berniece Salines, DO Cardiology APP:  Ledora Bottcher, PA {  Referring MD: Tonia Ghent, MD   Chief Complaint  Patient presents with   Follow-up  hypertension  History of Present Illness:    [name] is a 51 y.o. male with a hx of anxiety, migraine headaches, and family history of premature CAD.  Coronary CTA 03/28/20 showed a coronary calcium score of 0.  Echocardiogram 03/2020 reveals preserved EF and grade 1 DD, no significant valvular disease. Heart monitor worn for dizziness revealed no significant arrhythmias (03/2020).  He follows with Dr. Harriet Masson and was last seen 11/05/2020 and was doing well at that time. He did have mildly elevated BP and was instructed to monitor this at home. He presents for routine cardiology follow up. He denies chest pain, SOB, palpitations, lower extremity swelling, or syncope. 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 He has an SI joint fused. He is active with yard work. He can complete more than 4.0 METS without angina. He is struggling with facial nerve damage and is on high dose gabapentin.    Past Medical History:  Diagnosis Date   Abdominal wall hernia 12/05/2019   Aches 05/12/2018   Advance care planning 06/14/2017   Living will d/w pt.  Bethanne Ginger designated if patient were incapacitated.     ALLERGIC RHINITIS 01/22/2010   Qualifier: Diagnosis of  By: Fuller Plan CMA (AAMA), [place]     Allergy    Back pain 12/05/2019   FH: CAD (coronary artery disease) 12/05/2019   FHx: thyroid disease 12/05/2019   Generalized anxiety disorder 05/31/2010   Headache(784.0)    Migraine   History of kidney stones    Impingement syndrome of left shoulder    Stage II    Inattention 03/11/2011   Joint pain 08/13/2014   Medial meniscus tear    left   Migraine  headache 01/22/2010   Qualifier: Diagnosis of  By: Fuller Plan CMA (AAMA), [place]     Nasal septal deviation 01/25/2014   Neck pain 01/05/2019   Other specified abdominal hernia without obstruction or gangrene 02/28/2020   SHOULDER PAIN, LEFT 01/22/2010   Per [facility]     Skin lesion 02/07/2017   Tear of meniscus, medial    left knee   Temporal headache 02/06/2020   Ulnar neuropathy    mild symptoms    Unspecified inflammatory spondylopathy, sacral and sacrococcygeal region [facility]) 12/05/2010   Formatting of this note might be different from the original. Overview:  Per [facility]   Wears glasses 12/11/2020   White coat syndrome with high blood pressure but without hypertension     Past Surgical History:  Procedure Laterality Date   ABDOMINAL SURGERY  04/2020   linea alba surgery. [facility]    ANAL FISTULOTOMY N/A 12/17/2020   Procedure: PARTIAL ANAL FISTULOTOMY;  Surgeon: Ileana Roup, MD;  Location: [facility];  Service: General;  Laterality: N/A;   APPENDECTOMY  2012   lap append   CHOLECYSTECTOMY  2009   CLOSED REDUCTION CLAVICLE FRACTURE  1993   left   COLONOSCOPY  2008   ESOPHAGOGASTRODUODENOSCOPY     73[place]    GALLBLADDER SURGERY  2008   HERNIA REPAIR  2011  repair from gallbladder surgery   HERNIA REPAIR  [id number]   umbilical   HERNIA REPAIR  2021   VENTRAL HERNIA REPAIR   LITHOTRIPSY  2000   mesh surgery  2011   abdominal   NASAL SEPTOPLASTY W/ TURBINOPLASTY Bilateral 01/25/2014   Procedure: NASAL SEPTOPLASTY/BILATERAL INFERIOR TURBINATE REDUCTION;  Surgeon: Jerrell Belfast, MD;  Location: [facility];  Service: ENT;  Laterality: Bilateral;   PLACEMENT OF SETON N/A 12/17/2020   Procedure: Carmie Kanner OF CUTTING SETON;  Surgeon: Ileana Roup, MD;  Location: [facility];  Service: General;  Laterality: N/A;   RECTAL EXAM UNDER ANESTHESIA N/A 12/17/2020   Procedure:  ANORECTAL EXAM UNDER ANESTHESIA;  Surgeon: Ileana Roup, MD;  Location: [facility];  Service: General;  Laterality: N/A;   WISDOM TOOTH EXTRACTION      Current Medications: Current Meds  Medication Sig   albuterol (VENTOLIN HFA) 108 (90 Base) MCG/ACT inhaler Inhale into the lungs every 6 (six) hours as needed for wheezing or shortness of breath.   ALPRAZolam (XANAX) 1 MG tablet Take 0.5-1 tablets (0.5-1 mg total) by mouth 2 (two) times daily as needed. for anxiety   azelastine (ASTELIN) 0.1 % nasal spray Place 1 spray into both nostrils 2 (two) times daily. Use in each nostril as directed   dicyclomine (BENTYL) 20 MG tablet Take 20 mg by mouth 4 (four) times daily.   gabapentin (NEURONTIN) 300 MG capsule 1 tab twice a day if needed.   gabapentin (NEURONTIN) 600 MG tablet Take 1,200 mg by mouth 2 (two) times daily.   Ibuprofen 200 MG CAPS    methocarbamol (ROBAXIN) 500 MG tablet Take by mouth.   omeprazole (PRILOSEC) 40 MG capsule Take 40 mg by mouth daily.   predniSONE (DELTASONE) 20 MG tablet TAKE ONE TABLET BY MOUTH TWICE A DAY FOR 6 DAYS, THEN TAKE ONE TABLET BY MOUTH ONE TIME DAILY FOR 6 DAYS, THEN STOP   traMADol (ULTRAM) 50 MG tablet Take 0.5-1 tablets (25-50 mg total) by mouth 2 (two) times daily as needed.   triamcinolone (NASACORT) 55 MCG/ACT AERO nasal inhaler Place 2 sprays into the nose daily.     Allergies:   Pseudoephedrine, Sulfa antibiotics, Topiramate, Aimovig [erenumab-aooe], Amitriptyline, Celebrex [celecoxib], Cephalexin, Citalopram hydrobromide, Cyclobenzaprine hcl, Diazepam, Levaquin [levofloxacin], Sumatriptan, Tizanidine, Codeine, and Venlafaxine   Social History   Socioeconomic History   Marital status: Married    Spouse name: Not on file   Number of children: Not on file   Years of education: Not on file   Highest education level: Not on file  Occupational History   Occupation: Corporate treasurer, freelance work    Employer: [employer]   Occupation: PTA  Tobacco Use   Smoking status: Never   Smokeless tobacco: Never  Vaping Use   Vaping Use: Never used  Substance and Sexual Activity   Alcohol use: Yes    Alcohol/week: 0.0 standard drinks of alcohol    Comment: 1 glass of wine/month   Drug use: No   Sexual activity: Not on file  Other Topics Concern   Not on file  Social History Narrative   Long term relationship    Regular exercise- yes   Doing relief/PRN PT work.   Social Determinants of Health   Financial Resource Strain: Not on file  Food Insecurity: Not on file  Transportation Needs: Not on file  Physical Activity: Not on file  Stress: Not on file  Social Connections:  Not on file     Family History: The patient's family history includes Cancer in his mother; Esophageal varices in his mother; Heart disease in his father; Liver disease in his mother. There is no history of Colon cancer, Prostate cancer, Esophageal cancer, Rectal cancer, or Stomach cancer.  ROS:   Please see the history of present illness.     All other systems reviewed and are negative.  EKGs/Labs/Other Studies Reviewed:    The following studies were reviewed today:  Echo 03/21/20: 1. Left ventricular ejection fraction, by estimation, is 60 to 65%. The  left ventricle has normal function. The left ventricle has no regional  wall motion abnormalities. Left ventricular diastolic parameters are  consistent with Grade I diastolic  dysfunction (impaired relaxation).   2. Right ventricular systolic function is normal. The right ventricular  size is normal.   3. The mitral valve is normal in structure. No evidence of mitral valve  regurgitation. No evidence of mitral stenosis.   4. The aortic valve is tricuspid. Aortic valve regurgitation is not  visualized. No aortic stenosis is present.   5. The inferior vena cava is normal in size with greater than 50%  respiratory variability, suggesting right atrial pressure of 3  mmHg.   EKG:  EKG is  ordered today.  The ekg ordered today demonstrates sinus rhythm with HR 71  Recent Labs: 11/25/2021: ALT 20; BUN 12; Creatinine, Ser 1.02; Hemoglobin 17.0; Platelets 209.0; Potassium 4.6; Sodium 142  Recent Lipid Panel    Component Value Date/Time   CHOL 149 12/02/2019 1152   TRIG 139.0 12/02/2019 1152   HDL 32.30 (L) 12/02/2019 1152   CHOLHDL 5 12/02/2019 1152   VLDL 27.8 12/02/2019 1152   LDLCALC 89 12/02/2019 1152     Risk Assessment/Calculations:                Physical Exam:    VS:  BP 106/84 (BP Location: Left Arm, Patient Position: Sitting)   Pulse 71   Ht '5\' 8"'$  (1.727 m)   Wt 187 lb 6.4 oz (85 kg)   SpO2 99%   BMI 28.49 kg/m     Wt Readings from Last 3 Encounters:  03/25/22 187 lb 6.4 oz (85 kg)  01/24/22 188 lb (85.3 kg)  01/02/22 190 lb (86.2 kg)     GEN:  Well nourished, well developed in no acute distress HEENT: Normal NECK: No JVD; No carotid bruits LYMPHATICS: No lymphadenopathy CARDIAC: RRR, no murmurs, rubs, gallops RESPIRATORY:  Clear to auscultation without rales, wheezing or rhonchi  ABDOMEN: Soft, non-tender, non-distended MUSCULOSKELETAL:  No edema; No deformity  SKIN: Warm and dry NEUROLOGIC:  Alert and oriented x 3 PSYCHIATRIC:  Normal affect   ASSESSMENT:    1. Dyslipidemia   2. FH: CAD (coronary artery disease)   3. Grade I diastolic dysfunction    PLAN:    In order of problems listed above:  Elevated BP BP has normalized. He recounts that he can only take 25 mg tramadol due to hyperactivity.  No changes in medications   FH premature heart disease Risk factor modification Lipid panel - will check this today LDL in 2021 was 89  - we discussed current guidelines and possibly starting a low does crestro 10 mg twice weekly for aggressive risk factor modification.  A1c check this today   Grade 1 DD Euvolemic on exam.    Follow up with Dr. Harriet Masson or myself in 1 year.       Medication  Adjustments/Labs and Tests Ordered: Current medicines are reviewed at length with the patient today.  Concerns regarding medicines are outlined above.  Orders Placed This Encounter  Procedures   Hemoglobin A1c   Lipid panel   Hepatic function panel   EKG 12-Lead   No orders of the defined types were placed in this encounter.   Patient Instructions  Medication Instructions:  No Changes *If you need a refill on your cardiac medications before your next appointment, please call your pharmacy*   Lab Work: Lipid Panel, Hepatic Panel, HgbA1C Today If you have labs (blood work) drawn today and your tests are completely normal, you will receive your results only by: [facility] (if you have MyChart) OR A paper copy in the mail If you have any lab test that is abnormal or we need to change your treatment, we will call you to review the results.   Testing/Procedures: No Testing   Follow-Up: At [facility], you and your health needs are our priority.  As part of our continuing mission to provide you with exceptional heart care, we have created designated Provider Care Teams.  These Care Teams include your primary Cardiologist (physician) and Advanced Practice Providers (APPs -  Physician Assistants and Nurse Practitioners) who all work together to provide you with the care you need, when you need it.  We recommend signing up for the patient portal called "MyChart".  Sign up information is provided on this After Visit Summary.  MyChart is used to connect with patients for Virtual Visits (Telemedicine).  Patients are able to view lab/test results, encounter notes, upcoming appointments, etc.  Non-urgent messages can be sent to your provider as well.   To learn more about what you can do with MyChart, go to [email or web address].    Your next appointment:   1 year(s)  The format for your next appointment:   In Person  Provider:   Fabian Sharp, PA-C or, Berniece Salines, DO     Signed, Ledora Bottcher, [place]  03/25/2022 12:53 PM    [place]

## 2022-03-25 ENCOUNTER — Encounter: Payer: Self-pay | Admitting: Physician Assistant

## 2022-03-25 ENCOUNTER — Ambulatory Visit: Payer: BC Managed Care – PPO | Attending: Physician Assistant | Admitting: Physician Assistant

## 2022-03-25 VITALS — BP 106/84 | HR 71 | Ht 68.0 in | Wt 187.4 lb

## 2022-03-25 DIAGNOSIS — I5189 Other ill-defined heart diseases: Secondary | ICD-10-CM | POA: Diagnosis not present

## 2022-03-25 DIAGNOSIS — M47812 Spondylosis without myelopathy or radiculopathy, cervical region: Secondary | ICD-10-CM | POA: Diagnosis not present

## 2022-03-25 DIAGNOSIS — E785 Hyperlipidemia, unspecified: Secondary | ICD-10-CM | POA: Diagnosis not present

## 2022-03-25 DIAGNOSIS — Z8249 Family history of ischemic heart disease and other diseases of the circulatory system: Secondary | ICD-10-CM | POA: Diagnosis not present

## 2022-03-25 NOTE — Patient Instructions (Signed)
Medication Instructions:  No Changes *If you need a refill on your cardiac medications before your next appointment, please call your pharmacy*   Lab Work: Lipid Panel, Hepatic Panel, HgbA1C Today If you have labs (blood work) drawn today and your tests are completely normal, you will receive your results only by: [facility] (if you have MyChart) OR A paper copy in the mail If you have any lab test that is abnormal or we need to change your treatment, we will call you to review the results.   Testing/Procedures: No Testing   Follow-Up: At [facility], you and your health needs are our priority.  As part of our continuing mission to provide you with exceptional heart care, we have created designated Provider Care Teams.  These Care Teams include your primary Cardiologist (physician) and Advanced Practice Providers (APPs -  Physician Assistants and Nurse Practitioners) who all work together to provide you with the care you need, when you need it.  We recommend signing up for the patient portal called "MyChart".  Sign up information is provided on this After Visit Summary.  MyChart is used to connect with patients for Virtual Visits (Telemedicine).  Patients are able to view lab/test results, encounter notes, upcoming appointments, etc.  Non-urgent messages can be sent to your provider as well.   To learn more about what you can do with MyChart, go to [email or web address].    Your next appointment:   1 year(s)  The format for your next appointment:   In Person  Provider:   Fabian Sharp, PA-C or, Berniece Salines, DO

## 2022-03-26 ENCOUNTER — Other Ambulatory Visit: Payer: Self-pay | Admitting: *Deleted

## 2022-03-26 LAB — LIPID PANEL
Chol/HDL Ratio: 4.3 ratio (ref 0.0–5.0)
Cholesterol, Total: 155 mg/dL (ref 100–199)
HDL: 36 mg/dL — ABNORMAL LOW (ref >39)
LDL Chol Calc (NIH): 104 mg/dL — ABNORMAL HIGH (ref 0–99)
Triglycerides: 75 mg/dL (ref 0–149)
VLDL Cholesterol Cal: 15 mg/dL (ref 5–40)

## 2022-03-26 LAB — HEPATIC FUNCTION PANEL
ALT: 21 IU/L (ref 0–44)
AST: 25 IU/L (ref 0–40)
Albumin: 4.8 g/dL (ref 3.8–4.9)
Alkaline Phosphatase: 78 IU/L (ref 44–121)
Bilirubin Total: 1.3 mg/dL — ABNORMAL HIGH (ref 0.0–1.2)
Bilirubin, Direct: 0.27 mg/dL (ref 0.00–0.40)
Total Protein: 6.9 g/dL (ref 6.0–8.5)

## 2022-03-26 LAB — HEMOGLOBIN A1C
Est. average glucose Bld gHb Est-mCnc: 105 mg/dL
Hgb A1c MFr Bld: 5.3 % (ref 4.8–5.6)

## 2022-03-26 MED ORDER — ROSUVASTATIN CALCIUM 10 MG PO TABS: 10.0000 mg | ORAL_TABLET | ORAL | 1 refills | Status: DC

## 2022-04-05 ENCOUNTER — Other Ambulatory Visit: Payer: Self-pay | Admitting: Family Medicine

## 2022-04-07 NOTE — Telephone Encounter (Signed)
Refill request for  ALPRAZOLAM 1 MG TAB  and traMADol (ULTRAM) 50 MG tablet   LOV - 01/24/22 Next OV - not scheduled Last refill - 01/24/22 #30/1 on both rxs

## 2022-05-02 DIAGNOSIS — M47812 Spondylosis without myelopathy or radiculopathy, cervical region: Secondary | ICD-10-CM | POA: Diagnosis not present

## 2022-05-13 ENCOUNTER — Encounter: Payer: Self-pay | Admitting: Family Medicine

## 2022-05-13 ENCOUNTER — Ambulatory Visit (INDEPENDENT_AMBULATORY_CARE_PROVIDER_SITE_OTHER)
Admission: RE | Admit: 2022-05-13 | Discharge: 2022-05-13 | Disposition: A | Discharge status: Home / Self Care | Payer: BC Managed Care – PPO | Source: Ambulatory Visit | Attending: Family Medicine | Admitting: Family Medicine

## 2022-05-13 ENCOUNTER — Ambulatory Visit (INDEPENDENT_AMBULATORY_CARE_PROVIDER_SITE_OTHER): Payer: BC Managed Care – PPO | Admitting: Family Medicine

## 2022-05-13 VITALS — BP 118/72 | HR 67 | Temp 97.7°F | Ht 68.0 in | Wt 189.0 lb

## 2022-05-13 DIAGNOSIS — R109 Unspecified abdominal pain: Secondary | ICD-10-CM | POA: Diagnosis not present

## 2022-05-13 DIAGNOSIS — R519 Headache, unspecified: Secondary | ICD-10-CM | POA: Diagnosis not present

## 2022-05-13 LAB — CBC WITH DIFFERENTIAL/PLATELET
Basophils Absolute: 0 10*3/uL (ref 0.0–0.1)
Basophils Relative: 0.6 % (ref 0.0–3.0)
Eosinophils Absolute: 0.4 10*3/uL (ref 0.0–0.7)
Eosinophils Relative: 6.7 % — ABNORMAL HIGH (ref 0.0–5.0)
HCT: 46.5 % (ref 39.0–52.0)
Hemoglobin: 16.1 g/dL (ref 13.0–17.0)
Lymphocytes Relative: 34.9 % (ref 12.0–46.0)
Lymphs Abs: 1.9 10*3/uL (ref 0.7–4.0)
MCHC: 34.7 g/dL (ref 30.0–36.0)
MCV: 91.9 fl (ref 78.0–100.0)
Monocytes Absolute: 0.6 10*3/uL (ref 0.1–1.0)
Monocytes Relative: 11.5 % (ref 3.0–12.0)
Neutro Abs: 2.5 10*3/uL (ref 1.4–7.7)
Neutrophils Relative %: 46.3 % (ref 43.0–77.0)
Platelets: 208 10*3/uL (ref 150.0–400.0)
RBC: 5.06 Mil/uL (ref 4.22–5.81)
RDW: 12.3 % (ref 11.5–15.5)
WBC: 5.4 10*3/uL (ref 4.0–10.5)

## 2022-05-13 LAB — URINALYSIS, ROUTINE W REFLEX MICROSCOPIC
Bilirubin Urine: NEGATIVE
Hgb urine dipstick: NEGATIVE
Ketones, ur: NEGATIVE
Leukocytes,Ua: NEGATIVE
Nitrite: NEGATIVE
RBC / HPF: NONE SEEN (ref 0–?)
Specific Gravity, Urine: 1.025 (ref 1.000–1.030)
Total Protein, Urine: NEGATIVE
Urine Glucose: NEGATIVE
Urobilinogen, UA: 0.2 (ref 0.0–1.0)
WBC, UA: NONE SEEN (ref 0–?)
pH: 6 (ref 5.0–8.0)

## 2022-05-13 LAB — COMPREHENSIVE METABOLIC PANEL
ALT: 18 U/L (ref 0–53)
AST: 24 U/L (ref 0–37)
Albumin: 4.6 g/dL (ref 3.5–5.2)
Alkaline Phosphatase: 55 U/L (ref 39–117)
BUN: 10 mg/dL (ref 6–23)
CO2: 30 mEq/L (ref 19–32)
Calcium: 9.3 mg/dL (ref 8.4–10.5)
Chloride: 105 mEq/L (ref 96–112)
Creatinine, Ser: 0.96 mg/dL (ref 0.40–1.50)
GFR: 91 mL/min (ref >60.00)
Glucose, Bld: 90 mg/dL (ref 70–99)
Potassium: 3.7 mEq/L (ref 3.5–5.1)
Sodium: 142 mEq/L (ref 135–145)
Total Bilirubin: 1.3 mg/dL — ABNORMAL HIGH (ref 0.2–1.2)
Total Protein: 6.7 g/dL (ref 6.0–8.3)

## 2022-05-13 MED ORDER — DICYCLOMINE HCL 20 MG PO TABS: 20.0000 mg | ORAL_TABLET | Freq: Four times a day (QID) | ORAL | Status: DC | PRN

## 2022-05-13 MED ORDER — AZELASTINE HCL 0.1 % NA SOLN: 1.0000 | Freq: Two times a day (BID) | NASAL | 2 refills | Status: DC | PRN

## 2022-05-13 MED ORDER — GABAPENTIN 600 MG PO TABS: 1200.0000 mg | ORAL_TABLET | Freq: Two times a day (BID) | ORAL | Status: DC

## 2022-05-13 NOTE — Patient Instructions (Addendum)
Please check with Dr. Shela Leff and see about options.  Take care.  Glad to see you. Go to the lab on the way out.   If you have mychart we'll likely use that to update you.    Drink enough fluid to keep your urine clear or light colored.

## 2022-05-13 NOTE — Progress Notes (Unsigned)
He couldn't tolerate crestor.    The 10-year ASCVD risk score (Arnett DK, et al., 2019) is: 3.6%   Values used to calculate the score:     Age: 51 years     Sex: Male     Is Non-Hispanic African American: No     Diabetic: No     Tobacco smoker: No     Systolic Blood Pressure: 702 mmHg     Is BP treated: No     HDL Cholesterol: 36 mg/dL     Total Cholesterol: 155 mg/dL  He can feel a knot on the R side of the face, larger now.  Still with R sided facial pain.  Pain is some better for about 5 hours with gabapentin.  Tramadol and naproxen helps some.  More pain at the end of the day.   Flank pain.  R sided.  Some lower back pain.  No recent stones passed.  Occ burning with urination.  No known blood seen in urine.    Meds, vitals, and allergies reviewed.   ROS: Per HPI unless specifically indicated in ROS section   Nad Ncat He has altered sensation on the right side of the face.  There is a small nodule that is palpable in the cheek on the right side of his face.  No overlying bruising or erythema. Neck supple, no LA Rrr Ctab Abdomen soft.  Nontender. Skin well-perfused.

## 2022-05-14 DIAGNOSIS — R109 Unspecified abdominal pain: Secondary | ICD-10-CM | POA: Insufficient documentation

## 2022-05-14 DIAGNOSIS — R10A0 Flank pain, unspecified side: Secondary | ICD-10-CM | POA: Insufficient documentation

## 2022-05-14 NOTE — Assessment & Plan Note (Signed)
Reasonable to check urinalysis labs and KUB to evaluate for stone.  See notes on labs and imaging.  Continue liberal fluid intake.  He agrees to plan.  Okay for outpatient follow-up.

## 2022-05-14 NOTE — Assessment & Plan Note (Signed)
I asked him to follow-up with neurology.  See after visit summary.

## 2022-05-15 DIAGNOSIS — L219 Seborrheic dermatitis, unspecified: Secondary | ICD-10-CM | POA: Diagnosis not present

## 2022-05-15 DIAGNOSIS — D485 Neoplasm of uncertain behavior of skin: Secondary | ICD-10-CM | POA: Diagnosis not present

## 2022-05-15 DIAGNOSIS — Z872 Personal history of diseases of the skin and subcutaneous tissue: Secondary | ICD-10-CM | POA: Diagnosis not present

## 2022-05-15 DIAGNOSIS — Z808 Family history of malignant neoplasm of other organs or systems: Secondary | ICD-10-CM | POA: Diagnosis not present

## 2022-06-11 ENCOUNTER — Encounter: Payer: Self-pay | Admitting: Family Medicine

## 2022-07-14 ENCOUNTER — Encounter: Payer: Self-pay | Admitting: Family Medicine

## 2022-07-14 ENCOUNTER — Ambulatory Visit: Payer: 59 | Admitting: Family Medicine

## 2022-07-14 VITALS — BP 120/76 | HR 77 | Temp 97.9°F | Ht 68.0 in | Wt 192.0 lb

## 2022-07-14 DIAGNOSIS — R519 Headache, unspecified: Secondary | ICD-10-CM | POA: Diagnosis not present

## 2022-07-14 DIAGNOSIS — F419 Anxiety disorder, unspecified: Secondary | ICD-10-CM

## 2022-07-14 MED ORDER — GABAPENTIN 600 MG PO TABS: ORAL_TABLET | ORAL | Status: DC

## 2022-07-14 MED ORDER — ALPRAZOLAM 1 MG PO TABS: ORAL_TABLET | ORAL | 1 refills | Status: DC

## 2022-07-14 NOTE — Progress Notes (Unsigned)
Facial pain.  Still on gabapentin, still having pain/burning/tingling.  On the R side of the face, can radiate to the R ear.  Taking nsaids prn with some relief.  Hypersensitive to touch.  Oxcarbazepine didn't help with pain and he had more concentration changes.  He had trouble with focus at work, possibly also related to gabapentin dose.    Rectal/buttock pain improved with limiting sitting.     IMPRESSION:  Normal MRI of the neck. No abnormal finding is seen in the region of  the marker in the right cheek. There is no inflammatory change or  suspicious mass lesion in the right parotid gland which is located  posterior to the marker. The underlying masseter muscle is normal.   Anxiety d/w pt, using xanax prn.  No ADE on med.   Meds, vitals, and allergies reviewed.   ROS: Per HPI unless specifically indicated in ROS section   Nad Ncat Neck supple, no LA Rrr Ctab Abd soft, not ttp Skin well-perfused. Right side of face hypersensitive, does not cross midline. Speech fluent and judgment intact.  Not sedated.

## 2022-07-14 NOTE — Patient Instructions (Addendum)
Let me know if you don't get a call about seeing neurosurgery.  I'll update Dr. Shela Leff.  Gradually taper gabapentin.    I would try to taper gabapentin by taking 359m per dose at work. You could try taking 9055min the AM instead of 120020m  Take care.  Glad to see you.

## 2022-07-15 ENCOUNTER — Encounter: Payer: Self-pay | Admitting: *Deleted

## 2022-07-16 NOTE — Assessment & Plan Note (Signed)
Continue as needed Xanax.  Routine cautions given to patient.

## 2022-07-16 NOTE — Assessment & Plan Note (Signed)
Discussed with patient about options for trigeminal neuralgia. I will update Dr. Shela Leff We talked about neurosurgery eval, referral placed. Taper gabapentin slowly in the meantime.

## 2022-10-09 ENCOUNTER — Encounter: Payer: Self-pay | Admitting: Family Medicine

## 2022-10-09 ENCOUNTER — Other Ambulatory Visit: Payer: Self-pay

## 2022-10-09 ENCOUNTER — Emergency Department (HOSPITAL_COMMUNITY): Payer: 59

## 2022-10-09 ENCOUNTER — Emergency Department (HOSPITAL_COMMUNITY)
Admission: EM | Admit: 2022-10-09 | Discharge: 2022-10-09 | Disposition: A | Discharge status: Home / Self Care | Payer: 59 | Attending: Emergency Medicine | Admitting: Emergency Medicine

## 2022-10-09 DIAGNOSIS — I10 Essential (primary) hypertension: Secondary | ICD-10-CM | POA: Insufficient documentation

## 2022-10-09 DIAGNOSIS — R42 Dizziness and giddiness: Secondary | ICD-10-CM

## 2022-10-09 LAB — CBC WITH DIFFERENTIAL/PLATELET
Abs Immature Granulocytes: 0.01 10*3/uL (ref 0.00–0.07)
Basophils Absolute: 0.1 10*3/uL (ref 0.0–0.1)
Basophils Relative: 1 %
Eosinophils Absolute: 0.3 10*3/uL (ref 0.0–0.5)
Eosinophils Relative: 4 %
HCT: 46.5 % (ref 39.0–52.0)
Hemoglobin: 16.5 g/dL (ref 13.0–17.0)
Immature Granulocytes: 0 %
Lymphocytes Relative: 29 %
Lymphs Abs: 1.8 10*3/uL (ref 0.7–4.0)
MCH: 32 pg (ref 26.0–34.0)
MCHC: 35.5 g/dL (ref 30.0–36.0)
MCV: 90.1 fL (ref 80.0–100.0)
Monocytes Absolute: 0.7 10*3/uL (ref 0.1–1.0)
Monocytes Relative: 12 %
Neutro Abs: 3.4 10*3/uL (ref 1.7–7.7)
Neutrophils Relative %: 54 %
Platelets: 196 10*3/uL (ref 150–400)
RBC: 5.16 MIL/uL (ref 4.22–5.81)
RDW: 11.9 % (ref 11.5–15.5)
WBC: 6.3 10*3/uL (ref 4.0–10.5)
nRBC: 0 % (ref 0.0–0.2)

## 2022-10-09 LAB — COMPREHENSIVE METABOLIC PANEL
ALT: 37 U/L (ref 0–44)
AST: 30 U/L (ref 15–41)
Albumin: 4.2 g/dL (ref 3.5–5.0)
Alkaline Phosphatase: 70 U/L (ref 38–126)
Anion gap: 10 (ref 5–15)
BUN: 14 mg/dL (ref 6–20)
CO2: 22 mmol/L (ref 22–32)
Calcium: 9.4 mg/dL (ref 8.9–10.3)
Chloride: 106 mmol/L (ref 98–111)
Creatinine, Ser: 0.97 mg/dL (ref 0.61–1.24)
GFR, Estimated: >60 mL/min (ref >60)
Glucose, Bld: 100 mg/dL — ABNORMAL HIGH (ref 70–99)
Potassium: 3.4 mmol/L — ABNORMAL LOW (ref 3.5–5.1)
Sodium: 138 mmol/L (ref 135–145)
Total Bilirubin: 1.2 mg/dL (ref 0.3–1.2)
Total Protein: 7 g/dL (ref 6.5–8.1)

## 2022-10-09 LAB — TSH: TSH: 1.93 u[IU]/mL (ref 0.350–4.500)

## 2022-10-09 LAB — TROPONIN I (HIGH SENSITIVITY)
Troponin I (High Sensitivity): 3 ng/L (ref <18)
Troponin I (High Sensitivity): 5 ng/L (ref <18)

## 2022-10-09 MED ORDER — IOHEXOL 350 MG/ML SOLN
75.0000 mL | Freq: Once | INTRAVENOUS | Status: AC | PRN
  Administered 2022-10-09: 75 mL via INTRAVENOUS

## 2022-10-09 MED ORDER — SODIUM CHLORIDE (PF) 0.9 % IJ SOLN
INTRAMUSCULAR | Status: AC
  Filled 2022-10-09: qty 50

## 2022-10-09 MED ORDER — LORAZEPAM 2 MG/ML IJ SOLN
1.0000 mg | Freq: Once | INTRAMUSCULAR | Status: AC
  Administered 2022-10-09: 1 mg via INTRAVENOUS
  Filled 2022-10-09: qty 1

## 2022-10-09 NOTE — ED Notes (Signed)
Pt to CT

## 2022-10-09 NOTE — ED Notes (Signed)
Pt husband reports pt tapered off of Cymbalta 2 weeks ago and is concerned this could be attributing to pt symptoms. Dr. Anitra Lauth will be notified

## 2022-10-09 NOTE — ED Notes (Signed)
Pt is eating so will get temp after he is done

## 2022-10-09 NOTE — ED Notes (Signed)
Pt to MRI via WC

## 2022-10-09 NOTE — ED Triage Notes (Signed)
Pt reports sob, chest tightness, hypertension and dizziness x few days. Bilateral feet tingling and swelling.

## 2022-10-09 NOTE — ED Provider Notes (Signed)
[facility] Provider Note   CSN: [id number] Arrival date & time: 10/09/22  1121     History  Chief Complaint  Patient presents with   Shortness of Breath    [name] is a 52 y.o. male.  Patient is a 52 year old male with history of headache, generalized anxiety, kidney stones status post cholecystectomy and lithotripsy who is presenting today with multiple complaints.  Since Sunday patient has noticed elevated blood pressure with standing feelings of dizziness and pressure behind his eyes.  This has been fairly constant but he has been able to work until today his symptoms became more severe and he then had an interaction at work that made him very upset causing him to act out.  He reports some tightness in his chest or discomfort but no true pain and some tingling in bilateral hands on the way over here as well as some shortness of breath.  He has not had cough or fever.  He did report tick bites a few weeks ago and was planning on following up with his doctor tomorrow but was everything that happened today he felt he needed to come for evaluation.  He has no known heart disease, no recent immobilization unilateral leg pain or swelling, fevers, cough or congestion.  He has been eating a normal diet has no abdominal pain nausea vomiting or diarrhea.  He had noticed a rash on his legs over the last few days he is not sure what it came from but denies it being itchy.  He has not started any new medications but he has significant other did note that he tapered off his Cymbalta 2 weeks ago which has been the only medication change.  He does have a history of high blood pressure due to whitecoat syndrome but when he checks it at home it is usually normal.  He denies any unilateral weakness, inability to speak or vision changes.  The history is provided by the patient.  Shortness of Breath      Home Medications Prior to Admission medications    Medication Sig Start Date End Date Taking? Authorizing Provider  albuterol (VENTOLIN HFA) 108 (90 Base) MCG/ACT inhaler Inhale into the lungs every 6 (six) hours as needed for wheezing or shortness of breath.    [provider]  ALPRAZolam Prudy Feeler) 1 MG tablet TAKE ONE-HALF TO ONE TABLET BY MOUTH TWICE DAILY AS NEEDED FOR ANXIETY 07/14/22   Joaquim Nam, MD  azelastine (ASTELIN) 0.1 % nasal spray Place 1 spray into both nostrils 2 (two) times daily as needed for rhinitis. Use in each nostril as directed 05/13/22   Joaquim Nam, MD  dicyclomine (BENTYL) 20 MG tablet Take 1 tablet (20 mg total) by mouth 4 (four) times daily as needed for spasms. 05/13/22   Joaquim Nam, MD  gabapentin (NEURONTIN) 600 MG tablet 900-1200mg  in the AM, 300-600mg  lunch and supper, 900-1200mg  at night. 07/14/22   Joaquim Nam, MD  meloxicam (MOBIC) 7.5 MG tablet Take 7.5 mg by mouth 2 (two) times daily as needed for pain.    [provider]  naproxen (NAPROSYN) 500 MG tablet Take 500 mg by mouth 2 (two) times daily with a meal.    [provider]  omeprazole (PRILOSEC) 40 MG capsule Take 40 mg by mouth daily. 03/02/22   [provider]  traMADol (ULTRAM) 50 MG tablet TAKE ONE-HALF TO ONE TABLET BY MOUTH TWICE DAILY AS NEEDED 04/08/22  Joaquim Nam, MD  triamcinolone (NASACORT) 55 MCG/ACT AERO nasal inhaler Place 2 sprays into the nose daily. 09/19/21   Joaquim Nam, MD      Allergies    Pseudoephedrine, Sulfa antibiotics, Topiramate, Aimovig [erenumab-aooe], Amitriptyline, Celebrex [celecoxib], Cephalexin, Citalopram hydrobromide, Crestor [rosuvastatin], Cyclobenzaprine hcl, Diazepam, Levaquin [levofloxacin], Sumatriptan, Tizanidine, Codeine, and Venlafaxine    Review of Systems   Review of Systems  Respiratory:  Positive for shortness of breath.     Physical Exam Updated Vital Signs BP (!) 138/92   Pulse 76   Temp 98.1 F (36.7 C) (Oral)   Resp 14   Ht 5'  8" (1.727 m)   Wt 85.3 kg   SpO2 97%   BMI 28.59 kg/m  Physical Exam Vitals and nursing note reviewed.  Constitutional:      General: He is not in acute distress.    Appearance: He is well-developed.  HENT:     Head: Normocephalic and atraumatic.     Right Ear: Tympanic membrane normal.     Left Ear: Tympanic membrane normal.  Eyes:     Conjunctiva/sclera: Conjunctivae normal.     Pupils: Pupils are equal, round, and reactive to light.  Cardiovascular:     Rate and Rhythm: Normal rate and regular rhythm.     Heart sounds: No murmur heard. Pulmonary:     Effort: Pulmonary effort is normal. No respiratory distress.     Breath sounds: Normal breath sounds. No wheezing or rales.  Abdominal:     General: There is no distension.     Palpations: Abdomen is soft.     Tenderness: There is no abdominal tenderness. There is no guarding or rebound.  Musculoskeletal:        General: No tenderness. Normal range of motion.     Cervical back: Normal range of motion and neck supple.  Skin:    General: Skin is warm and dry.     Findings: Rash present. No erythema.     Comments: Petechia noted in bilateral lower extremities does not involve the feet  Neurological:     Mental Status: He is alert and oriented to person, place, and time.  Psychiatric:        Behavior: Behavior normal.     ED Results / Procedures / Treatments   Labs (all labs ordered are listed, but only abnormal results are displayed) Labs Reviewed  COMPREHENSIVE METABOLIC PANEL - Abnormal; Notable for the following components:      Result Value   Potassium 3.4 (*)    Glucose, Bld 100 (*)    All other components within normal limits  CBC WITH DIFFERENTIAL/PLATELET  TSH  TROPONIN I (HIGH SENSITIVITY)  TROPONIN I (HIGH SENSITIVITY)    EKG EKG Interpretation  Date/Time:  Thursday Oct 09 2022 11:29:44 EDT Ventricular Rate:  83 PR Interval:  154 QRS Duration: 83 QT Interval:  353 QTC Calculation: 415 R  Axis:   91 Text Interpretation: Sinus rhythm Borderline right axis deviation No previous tracing Confirmed by Gwyneth Sprout ([id number]) on 10/09/2022 11:32:19 AM  Radiology DG Chest Port 1 View  Result Date: 10/09/2022 CLINICAL DATA:  Chest pain. EXAM: PORTABLE CHEST 1 VIEW COMPARISON:  None Available. FINDINGS: The heart size and mediastinal contours are within normal limits. Both lungs are clear. The visualized skeletal structures are unremarkable. IMPRESSION: No active disease. Electronically Signed   By: Larose Hires D.O.   On: 10/09/2022 13:16   CT Head Wo Contrast  Result Date: 10/09/2022  CLINICAL DATA:  Mental status change. Hypertension and dizziness. Chest pain for 3 days. EXAM: CT HEAD WITHOUT CONTRAST TECHNIQUE: Contiguous axial images were obtained from the base of the skull through the vertex without intravenous contrast. RADIATION DOSE REDUCTION: This exam was performed according to the departmental dose-optimization program which includes automated exposure control, adjustment of the mA and/or kV according to patient size and/or use of iterative reconstruction technique. COMPARISON:  CT examination dated Oct 24, 2021 FINDINGS: Brain: No evidence of acute infarction, hemorrhage, hydrocephalus, extra-axial collection or mass lesion/mass effect. Vascular: No hyperdense vessel or unexpected calcification. Skull: Normal. Negative for fracture or focal lesion. Sinuses/Orbits: No acute finding. Other: None. IMPRESSION: No acute intracranial pathology. Electronically Signed   By: Larose Hires D.O.   On: 10/09/2022 13:14    Procedures Procedures    Medications Ordered in ED Medications  LORazepam (ATIVAN) injection 1 mg (has no administration in time range)    ED Course/ Medical Decision Making/ A&P                             Medical Decision Making Amount and/or Complexity of Data Reviewed Independent Historian: spouse Labs: ordered. Decision-making details documented in ED  Course. Radiology: ordered and independent interpretation performed. Decision-making details documented in ED Course. ECG/medicine tests: ordered and independent interpretation performed. Decision-making details documented in ED Course.  Risk Prescription drug management.   Pt with multiple medical problems and comorbidities and presenting today with a complaint that caries a high risk for morbidity and mortality.  Here today with complaints of dizziness, headache, elevated blood pressure all which is new for the patient.  No focal findings on exam concerning for stroke, patient does have some petechiae in his lower extremities but no purpura or evidence of infection.  Does have a history of tick bite 2 weeks ago but he has not had fever but does endorse headache.  Patient did come off of Cymbalta 2 weeks ago possibility for withdrawal reaction.  Also concern for possible hypertensive urgency but no evidence concerning for press or stroke.  Will ensure no evidence of thrombocytopenia, ACS, PERC negative and feel that PE is low.  Also low suspicion for dissection.  Will ensure normal thyroid and no electrolyte disturbances.  Also will ensure normal renal function. I independently interpreted patient's labs and EKG.  EKG without acute findings.  TSH within normal limits, CBC, BMP and troponin are all normal.  With lying down patient's blood pressure is improved and with standing it is elevated.  I have independently visualized and interpreted pt's images today.  Chest x-ray within normal limits today and head CT without acute findings.  Radiology also reports no acute issues.   3:28 PM Repeat evaluation patient is still hypertensive which does seem to be worse with standing.  Long discussion with the patient and his spouse he does state symptoms started suddenly and now gives little more description of some dizziness which does not seem to be near syncope but is not classically vertiginous either.   Given new high blood pressure concern for possible stroke or vertebral issues patient is also having a headache.  Discussed with patient CTA of the head and neck and MRI to rule out stroke or dissection.  If this is normal feel it is reasonable for patient to be discharged home and follow-up with his PCP as planned tomorrow.  He and his family member are comfortable with this plan.  He will need some Ativan for anxiolysis prior to the MRI.         Final Clinical Impression(s) / ED Diagnoses Final diagnoses:  Uncontrolled hypertension  Dizziness    Rx / DC Orders ED Discharge Orders     None         Gwyneth Sprout, MD 10/09/22 1528

## 2022-10-09 NOTE — ED Notes (Signed)
Pt requesting for thyroid levels to be checked. Dr. Anitra Lauth notified

## 2022-10-09 NOTE — ED Provider Notes (Signed)
Pt signed out by Dr. Anitra Lauth pending CTA and MRI.  Films reviewed by me.  I agree with the radiologist.  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 This is  relatively similar to the appearance on prior MRA dated 05/31/2020  and may be chronic in nature.  2. No hemodynamically significant stenosis in the neck.   MRI brain:  Normal brain MRI.   Pt still feels a little strange.  However, he looks well.  He has an appt with his pcp tomorrow.  He is stable for d/c home.  Return if worse.     Jacalyn Lefevre, MD 10/09/22 [phone]

## 2022-10-10 ENCOUNTER — Ambulatory Visit: Payer: 59 | Admitting: Family Medicine

## 2022-10-10 ENCOUNTER — Encounter: Payer: Self-pay | Admitting: Family Medicine

## 2022-10-10 VITALS — BP 140/80 | HR 87 | Temp 97.9°F | Ht 68.0 in | Wt 184.0 lb

## 2022-10-10 DIAGNOSIS — R21 Rash and other nonspecific skin eruption: Secondary | ICD-10-CM

## 2022-10-10 DIAGNOSIS — R519 Headache, unspecified: Secondary | ICD-10-CM | POA: Diagnosis not present

## 2022-10-10 MED ORDER — TRIAMCINOLONE ACETONIDE 55 MCG/ACT NA AERO: 2.0000 | INHALATION_SPRAY | Freq: Every day | NASAL | Status: DC | PRN

## 2022-10-10 NOTE — Patient Instructions (Addendum)
Go to the lab on the way out.   If you have mychart we'll likely use that to update you.    Take care.  Glad to see you. I'll check with neurology in the meantime.

## 2022-10-10 NOTE — Progress Notes (Unsigned)
Tick bite x2.  Both removed.  He had no bullseye rash.  Tapered off cymbalta in the meantime, over a 2 week period.  Didn't feel well at work 1 week ago, sugar 118 at work.  5/13 and 5/14 he was clumsy, others noted it too.  Checked his BP and it was high.  Then to ER.  Felt lightheaded at the time.  No syncope.  No speech changes.  He has been more anxious in the meantime.  More irritable in the meantime.  Sleeping about 5 hours per day.  Some leg cramps in the quads.  He has noted sugar cravings.  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    Facial pain is better on oxtellar.  Mood was clearly better on cymbalta, more even and less irritable.  No SI/HI.    Discussed recent imaging report. IMPRESSION: 1. 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 This is relatively similar to the appearance on prior MRA dated 05/31/2020 and may be chronic in nature. 2. No hemodynamically significant stenosis in the neck.  Meds, vitals, and allergies reviewed.   ROS: Per HPI unless specifically indicated in ROS section   GEN: nad, alert and oriented HEENT: mucous membranes moist NECK: supple w/o LA CV: rrr. PULM: ctab, no inc wob ABD: soft, +bs EXT: no edema SKIN: no acute rash but some scattered punctate reddish spots w/o frank petechiae.  Normal gait and CN 2-12 wnl B, S/S/DTR wnl B Normal cerebellar testing.

## 2022-10-12 ENCOUNTER — Telehealth: Payer: Self-pay | Admitting: Family Medicine

## 2022-10-12 LAB — B. BURGDORFI ANTIBODIES: B burgdorferi Ab IgG+IgM: <0.9 index

## 2022-10-12 NOTE — Assessment & Plan Note (Signed)
His rash is benign appearing and does not appear to be tick related but would be reasonable to check routine labs today.

## 2022-10-12 NOTE — Assessment & Plan Note (Addendum)
His facial pain is better on Oxtellar.  His mood is clearly better on Cymbalta.  He has been more irritable off Cymbalta.  He may not have true manic dx due to confounding issues, ie tick bite, med changes, etc.   He has ADD eval pending.  No illicits.  He has no suicidal or homicidal intent.  I need input from neurology about potentially restarting Cymbalta and also his most recent imaging.  We will call for input. Enzo Montgomery, MD  [address] [facility]  [address]  [place], [place] [address]  [phone] (Work)   40 minutes were devoted to patient care in this encounter (this includes time spent reviewing the patient's file/history, interviewing and examining the patient, counseling/reviewing plan with patient).

## 2022-10-12 NOTE — Telephone Encounter (Signed)
I need input from neurology about potentially restarting Cymbalta and also his most recent imaging.  Please call: Enzo Montgomery, MD  [address] [facility] [address] [facility]  [address]  [place], [place] [address]  [phone] (Work)   Please pass along that I would like to speak with Dr. Rosalyn Gess.  Thanks.

## 2022-10-13 NOTE — Telephone Encounter (Signed)
Called Dr. Carmie Kanner office and left a message on his nurses VM to call back.

## 2022-10-14 ENCOUNTER — Other Ambulatory Visit: Payer: Self-pay | Admitting: Family Medicine

## 2022-10-14 LAB — ROCKY MTN SPOTTED FVR ABS PNL(IGG+IGM)
RMSF IgG: NOT DETECTED
RMSF IgM: NOT DETECTED

## 2022-10-14 MED ORDER — DULOXETINE HCL 30 MG PO CPEP: 30.0000 mg | ORAL_CAPSULE | Freq: Every day | ORAL | Status: DC

## 2022-10-14 NOTE — Telephone Encounter (Signed)
Dr. Para March was able to speak with Dr. Rosalyn Gess today and has responded to patients mychart message.

## 2022-10-14 NOTE — Progress Notes (Signed)
Call from Dr. Rosalyn Gess.  D/w pt about recent ER visit, imaging, labs and OV here.  Agreed to restart cymbalta.  He'll have his clinic check with patient about CTA findings, he will address aspirin and plavix and will set up interventional radiology referral.  I will follow up his pending RMSF labs.  I thanked him for his help.

## 2022-10-15 ENCOUNTER — Other Ambulatory Visit: Payer: Self-pay | Admitting: Family Medicine

## 2022-10-15 MED ORDER — DULOXETINE HCL 20 MG PO CPEP: 20.0000 mg | ORAL_CAPSULE | Freq: Every day | ORAL | 1 refills | Status: DC

## 2022-10-20 ENCOUNTER — Encounter: Payer: Self-pay | Admitting: Cardiology

## 2022-10-20 ENCOUNTER — Encounter: Payer: Self-pay | Admitting: Family Medicine

## 2022-10-22 ENCOUNTER — Encounter: Payer: Self-pay | Admitting: Physician Assistant

## 2022-10-22 ENCOUNTER — Ambulatory Visit: Payer: 59 | Attending: Physician Assistant | Admitting: Physician Assistant

## 2022-10-22 VITALS — BP 142/84 | HR 80 | Ht 68.0 in | Wt 186.6 lb

## 2022-10-22 DIAGNOSIS — R079 Chest pain, unspecified: Secondary | ICD-10-CM

## 2022-10-22 NOTE — Progress Notes (Unsigned)
Cardiology Office Note:    Date:  10/23/2022   ID:  [name], DOB Jun 17, 1970, MRN [id number]  PCP:  Joaquim Nam, MD    [facility] Providers Cardiologist:  Thomasene Ripple, DO Cardiology APP:  Marcelino Duster, PA     Referring MD: Joaquim Nam, MD   Chief Complaint  Patient presents with   Follow-up    Seen for Dr Servando Salina    History of Present Illness:    [name] is a 52 y.o. male with a hx of anxiety, migraine, and a family history of premature CAD.  A coronary CT obtained on 03/28/2020 showed coronary calcium score of 0, no evidence of CAD.  Echocardiogram obtained in 03/2020 showed normal EF, grade 1 DD, no significant valvular disease.  Heart monitor worn for dizziness showed no significant arrhythmia.  The patient was last seen by Jerene Dilling in October 2023 at which time he denied any chest pain, shortness of breath or palpitation.  He was able to accomplish more than 4 METS of activity without any issue.  More recently, patient presented to the ED on 10/09/2022 with multiple complaints including elevated blood pressure, dizziness, tightness in the chest and shortness of breath.  He reported recent tick bite.  Patient at that time reported his blood pressure is usually normal at home and only become elevated in the doctor's office as he has whitecoat syndrome.  Blood work include CMP CBC TSH and troponin were normal except for borderline low potassium.  CT of the head showed no acute intracranial abnormality.  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  MRI was normal.  Patient was given Ativan for anxiety.  Patient presents today for posthospital follow-up.  He still has a minimal dull ache in the left  upper pectoral space.  He says it does not bother him if he does not pay attention to it however only notices it when he sit down and pay attention to his chest.  It does not happen when he does physical exertion.  Since this is a constant ache for 2 weeks, suspicion is quite low for cardiac cause.  His coronary CT showed normal coronary arteries 3 years ago.  I recommended continue observation.  His blood pressure was previously in the 110s to 120s in February and March, however jumped up to 140s recently.  He was taken off of Cymbalta however recently restarted on the Cymbalta due to concern of anxiety.  I also suspect anxiety may contribute to some of his current symptoms and elevated blood pressure.    Past Medical History:  Diagnosis Date   Abdominal wall hernia 12/05/2019   Aches 05/12/2018   Advance care planning 06/14/2017   Living will d/w pt.  Justine Null designated if patient were incapacitated.     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    Inattention 03/11/2011   Joint pain 08/13/2014   Medial meniscus tear    left   Migraine headache 01/22/2010   Qualifier: Diagnosis of  By: Etheleen Mayhew CMA (AAMA), [place]     Nasal septal deviation 01/25/2014   Neck pain 01/05/2019   Other specified abdominal hernia without obstruction or gangrene 02/28/2020   SHOULDER PAIN, LEFT 01/22/2010   Per [facility]     Skin lesion 02/07/2017   Tear of meniscus, medial    left knee   Temporal headache 02/06/2020   Ulnar neuropathy    mild symptoms    Unspecified inflammatory spondylopathy, sacral and sacrococcygeal region [facility]) 12/05/2010   Formatting of this note might be  different from the original. Overview:  Per [facility]   Wears glasses 12/11/2020   White coat syndrome with high blood pressure but without hypertension     Past Surgical History:  Procedure Laterality Date   ABDOMINAL SURGERY  04/2020   linea alba surgery. Dr [facility]    ANAL FISTULOTOMY N/A 12/17/2020   Procedure: PARTIAL ANAL FISTULOTOMY;  Surgeon: Andria Meuse, MD;  Location: [facility];  Service: General;  Laterality: N/A;   APPENDECTOMY  2012   lap append   CHOLECYSTECTOMY  2009   CLOSED REDUCTION CLAVICLE FRACTURE  1993   left   COLONOSCOPY  2008   ESOPHAGOGASTRODUODENOSCOPY     1990's [place]    GALLBLADDER SURGERY  2008   HERNIA REPAIR  2011   repair from gallbladder surgery   HERNIA REPAIR  2013   umbilical   HERNIA REPAIR  2021   VENTRAL HERNIA REPAIR   LITHOTRIPSY  2000   mesh surgery  2011   abdominal   NASAL SEPTOPLASTY W/ TURBINOPLASTY Bilateral 01/25/2014   Procedure: NASAL SEPTOPLASTY/BILATERAL INFERIOR TURBINATE REDUCTION;  Surgeon: Osborn Coho, MD;  Location:  SURGERY CENTER;  Service: ENT;  Laterality: Bilateral;   PLACEMENT OF SETON N/A 12/17/2020   Procedure: Jaquita Folds OF CUTTING SETON;  Surgeon: Andria Meuse, MD;  Location: [facility];  Service: General;  Laterality: N/A;   RECTAL EXAM UNDER ANESTHESIA N/A 12/17/2020   Procedure: ANORECTAL EXAM UNDER ANESTHESIA;  Surgeon: Andria Meuse, MD;  Location: [facility];  Service: General;  Laterality: N/A;   WISDOM TOOTH EXTRACTION      Current Medications: Current Meds  Medication Sig   clopidogrel (PLAVIX) 75 MG tablet      Allergies:   Pseudoephedrine, Sulfa antibiotics, Topiramate, Aimovig [erenumab-aooe], Amitriptyline, Celebrex [celecoxib], Cephalexin, Citalopram hydrobromide, Crestor [rosuvastatin], Cyclobenzaprine hcl, Diazepam, Levaquin [levofloxacin], Sumatriptan, Tizanidine,  Codeine, and Venlafaxine   Social History   Socioeconomic History   Marital status: Married    Spouse name: Not on file   Number of children: Not on file   Years of education: Not on file   Highest education level: Associate degree: occupational, Scientist, product/process development, or vocational program  Occupational History   Occupation: Risk analyst, freelance work    Employer: [employer]   Occupation: PTA  Tobacco Use   Smoking status: Never   Smokeless tobacco: Never  Vaping Use   Vaping Use: Never used  Substance and Sexual Activity   Alcohol use: Yes    Alcohol/week: 0.0 standard drinks of alcohol    Comment: 1 glass of wine/month   Drug use: No   Sexual activity: Not on file  Other Topics Concern   Not on file  Social  History Narrative   Long term relationship    Regular exercise- yes   Doing relief/PRN PT work.   Social Determinants of Health   Financial Resource Strain: Low Risk  (10/09/2022)   Overall Financial Resource Strain (CARDIA)    Difficulty of Paying Living Expenses: Not very hard  Food Insecurity: No Food Insecurity (10/09/2022)   Hunger Vital Sign    Worried About Running Out of Food in the Last Year: Never true    Ran Out of Food in the Last Year: Never true  Transportation Needs: No Transportation Needs (10/09/2022)   PRAPARE - Administrator, Civil Service (Medical): No    Lack of Transportation (Non-Medical): No  Physical Activity: Sufficiently Active (10/09/2022)   Exercise Vital Sign    Days of Exercise per Week: 7 days    Minutes of Exercise per Session: 150+ min  Stress: Stress Concern Present (10/09/2022)   [employer] of Occupational Health - Occupational Stress Questionnaire    Feeling of Stress : Very much  Social Connections: Unknown (10/09/2022)   Social Connection and Isolation Panel [NHANES]    Frequency of Communication with Friends and Family: More than three times a week    Frequency of Social Gatherings with Friends and  Family: Once a week    Attends Religious Services: Patient declined    Database administrator or Organizations: Patient declined    Attends Engineer, structural: Not on file    Marital Status: Married     Family History: The patient's family history includes Cancer in his mother; Esophageal varices in his mother; Heart disease in his father; Liver disease in his mother. There is no history of Colon cancer, Prostate cancer, Esophageal cancer, Rectal cancer, or Stomach cancer.  ROS:   Please see the history of present illness.     All other systems reviewed and are negative.  EKGs/Labs/Other Studies Reviewed:    The following studies were reviewed today:  Echo 03/21/2020  1. Left ventricular ejection fraction, by estimation, is 60 to 65%. The  left ventricle has normal function. The left ventricle has no regional  wall motion abnormalities. Left ventricular diastolic parameters are  consistent with Grade I diastolic  dysfunction (impaired relaxation).   2. Right ventricular systolic function is normal. The right ventricular  size is normal.   3. The mitral valve is normal in structure. No evidence of mitral valve  regurgitation. No evidence of mitral stenosis.   4. The aortic valve is tricuspid. Aortic valve regurgitation is not  visualized. No aortic stenosis is present.   5. The inferior vena cava is normal in size with greater than 50%  respiratory variability, suggesting right atrial pressure of 3 mmHg.    EKG:  EKG is ordered today.  The ekg ordered today demonstrates normal sinus rhythm, no significant ST-T wave changes.  Recent Labs: 10/09/2022: ALT 37; BUN 14; Creatinine, Ser 0.97; Hemoglobin 16.5; Platelets 196; Potassium 3.4; Sodium 138; TSH 1.930  Recent Lipid Panel    Component Value Date/Time   CHOL 155 03/25/2022 1228   TRIG 75 03/25/2022 1228   HDL 36 (L) 03/25/2022 1228   CHOLHDL 4.3 03/25/2022 1228   CHOLHDL 5 12/02/2019 1152   VLDL 27.8 12/02/2019  1152   LDLCALC 104 (H) 03/25/2022 1228     Risk Assessment/Calculations:           Physical Exam:    VS:  BP (!) 142/84   Pulse 80   Ht  5\' 8"  (1.727 m)   Wt 186 lb 9.6 oz (84.6 kg)   SpO2 98%   BMI 28.37 kg/m        Wt Readings from Last 3 Encounters:  10/22/22 186 lb 9.6 oz (84.6 kg)  10/10/22 184 lb (83.5 kg)  10/09/22 188 lb (85.3 kg)     GEN:  Well nourished, well developed in no acute distress HEENT: Normal NECK: No JVD; No carotid bruits LYMPHATICS: No lymphadenopathy CARDIAC: RRR, no murmurs, rubs, gallops RESPIRATORY:  Clear to auscultation without rales, wheezing or rhonchi  ABDOMEN: Soft, non-tender, non-distended MUSCULOSKELETAL:  No edema; No deformity  SKIN: Warm and dry NEUROLOGIC:  Alert and oriented x 3 PSYCHIATRIC:  Normal affect   ASSESSMENT:    1. Chest pain of uncertain etiology    PLAN:    In order of problems listed above:  Atypical chest pain: Symptom has been constant for few days.  He had normal coronary arteries on coronary CT 3 years ago, will continue observation at this time given the atypical nature of his symptoms.  Will plan to reassess the patient in a few weeks.  He is aware to contact us if his symptoms suddenly worsens.           Medication Adjustments/Labs and Tests Ordered: Current medicines are reviewed at length with the patient today.  Concerns regarding medicines are outlined above.  Orders Placed This Encounter  Procedures   EKG 12-Lead   No orders of the defined types were placed in this encounter.   Patient Instructions  Medication Instructions:  No Changes *If you need a refill on your cardiac medications before your next appointment, please call your pharmacy*   Lab Work: No Labs If you have labs (blood work) drawn today and your tests are completely normal, you will receive your results only by: MyChart Message (if you have MyChart) OR A paper copy in the mail If you have any lab test that is  abnormal or we need to change your treatment, we will call you to review the results.   Testing/Procedures: No Testing    Follow-Up: At [facility], you and your health needs are our priority.  As part of our continuing mission to provide you with exceptional heart care, we have created designated Provider Care Teams.  These Care Teams include your primary Cardiologist (physician) and Advanced Practice Providers (APPs -  Physician Assistants and Nurse Practitioners) who all work together to provide you with the care you need, when you need it.  We recommend signing up for the patient portal called "MyChart".  Sign up information is provided on this After Visit Summary.  MyChart is used to connect with patients for Virtual Visits (Telemedicine).  Patients are able to view lab/test results, encounter notes, upcoming appointments, etc.  Non-urgent messages can be sent to your provider as well.   To learn more about what you can do with MyChart, go to [email or web address].    Your next appointment:   4 week(s)  Provider:   Azalee Course, PA-C       Signed, Azalee Course, [place]  10/23/2022 11:30 PM    [facility]

## 2022-10-22 NOTE — Patient Instructions (Signed)
Medication Instructions:  No Changes *If you need a refill on your cardiac medications before your next appointment, please call your pharmacy*   Lab Work: No Labs If you have labs (blood work) drawn today and your tests are completely normal, you will receive your results only by: MyChart Message (if you have MyChart) OR A paper copy in the mail If you have any lab test that is abnormal or we need to change your treatment, we will call you to review the results.   Testing/Procedures: No Testing    Follow-Up: At [facility], you and your health needs are our priority.  As part of our continuing mission to provide you with exceptional heart care, we have created designated Provider Care Teams.  These Care Teams include your primary Cardiologist (physician) and Advanced Practice Providers (APPs -  Physician Assistants and Nurse Practitioners) who all work together to provide you with the care you need, when you need it.  We recommend signing up for the patient portal called "MyChart".  Sign up information is provided on this After Visit Summary.  MyChart is used to connect with patients for Virtual Visits (Telemedicine).  Patients are able to view lab/test results, encounter notes, upcoming appointments, etc.  Non-urgent messages can be sent to your provider as well.   To learn more about what you can do with MyChart, go to [email or web address].    Your next appointment:   4 week(s)  Provider:   Azalee Course, PA-C

## 2022-10-23 ENCOUNTER — Encounter: Payer: Self-pay | Admitting: Physician Assistant

## 2022-10-31 ENCOUNTER — Other Ambulatory Visit: Payer: Self-pay | Admitting: Family Medicine

## 2022-10-31 NOTE — Telephone Encounter (Signed)
Refill request for ALPRAZOLAM 1 MG TAB   LOV - 10/10/22 Next OV - not scheduled Last refill - 07/14/22 #30/1

## 2022-11-04 ENCOUNTER — Encounter: Payer: Self-pay | Admitting: Family Medicine

## 2022-11-05 ENCOUNTER — Other Ambulatory Visit: Payer: Self-pay | Admitting: Family Medicine

## 2022-11-25 ENCOUNTER — Encounter: Payer: Self-pay | Admitting: Cardiology

## 2022-11-25 ENCOUNTER — Ambulatory Visit: Payer: 59 | Attending: Cardiology | Admitting: Cardiology

## 2022-11-25 VITALS — BP 136/88 | HR 84 | Ht 68.0 in | Wt 193.2 lb

## 2022-11-25 DIAGNOSIS — R0789 Other chest pain: Secondary | ICD-10-CM | POA: Diagnosis not present

## 2022-11-25 NOTE — Patient Instructions (Signed)
Medication Instructions:  Your physician recommends that you continue on your current medications as directed. Please refer to the Current Medication list given to you today.  *If you need a refill on your cardiac medications before your next appointment, please call your pharmacy*   Lab Work: None   Testing/Procedures: None   Follow-Up: At [facility], you and your health needs are our priority.  As part of our continuing mission to provide you with exceptional heart care, we have created designated Provider Care Teams.  These Care Teams include your primary Cardiologist (physician) and Advanced Practice Providers (APPs -  Physician Assistants and Nurse Practitioners) who all work together to provide you with the care you need, when you need it.   Your next appointment:   1 year(s)  Provider:   Kardie Tobb, DO   

## 2022-11-25 NOTE — Progress Notes (Signed)
Cardiology Office Note:    Date:  11/28/2022   ID:  [name], DOB 06/18/1970, MRN [id number]  PCP:  Joaquim Nam, MD  Cardiologist:  Thomasene Ripple, DO  Electrophysiologist:  None   Referring MD: Joaquim Nam, MD   " I am doing much better now"  History of Present Illness:    [name] is a 52 y.o. male with a hx of migraine headache, anxiety, here today for a follow up visit.   I last saw the patient in 10/2020 at that time he was was doing well from a CV standpoint. Since his visit with he he did follow with our APP - with most recent visit with [facility]. At that visit on 5/22/224 he reported chest pain which was noted to be atypical. He is here for a follow up. He tells me that the symptoms has resolved.    Past Medical History:  Diagnosis Date   Abdominal wall hernia 12/05/2019   Aches 05/12/2018   Advance care planning 06/14/2017   Living will d/w pt.  Justine Null designated if patient were incapacitated.     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    Inattention 03/11/2011   Joint pain 08/13/2014   Medial meniscus tear    left   Migraine headache 01/22/2010   Qualifier: Diagnosis of  By: Etheleen Mayhew CMA (AAMA), [place]     Nasal septal deviation 01/25/2014   Neck pain 01/05/2019   Other specified abdominal hernia without obstruction or gangrene 02/28/2020   SHOULDER PAIN, LEFT 01/22/2010   Per [facility]     Skin lesion 02/07/2017   Tear of meniscus, medial    left knee   Temporal headache 02/06/2020   Ulnar neuropathy    mild symptoms    Unspecified inflammatory spondylopathy, sacral and sacrococcygeal region [facility]) 12/05/2010    Formatting of this note might be different from the original. Overview:  Per [facility]   Wears glasses 12/11/2020   White coat syndrome with high blood pressure but without hypertension     Past Surgical History:  Procedure Laterality Date   ABDOMINAL SURGERY  04/2020   linea alba surgery. [facility]    ANAL FISTULOTOMY N/A 12/17/2020   Procedure: PARTIAL ANAL FISTULOTOMY;  Surgeon: Andria Meuse, MD;  Location: [facility];  Service: General;  Laterality: N/A;   APPENDECTOMY  2012   lap append   CHOLECYSTECTOMY  2009   CLOSED REDUCTION CLAVICLE FRACTURE  1993   left   COLONOSCOPY  2008   ESOPHAGOGASTRODUODENOSCOPY     1990's [place]    GALLBLADDER SURGERY  2008   HERNIA REPAIR  2011   repair from gallbladder surgery   HERNIA REPAIR  2013   umbilical   HERNIA REPAIR  2021   VENTRAL HERNIA REPAIR   LITHOTRIPSY  2000   mesh surgery  2011   abdominal   NASAL SEPTOPLASTY W/ TURBINOPLASTY Bilateral 01/25/2014   Procedure: NASAL SEPTOPLASTY/BILATERAL INFERIOR TURBINATE REDUCTION;  Surgeon: Osborn Coho, MD;  Location: [facility];  Service: ENT;  Laterality:  Bilateral;   PLACEMENT OF SETON N/A 12/17/2020   Procedure: Jaquita Folds OF CUTTING SETON;  Surgeon: Andria Meuse, MD;  Location: [facility];  Service: General;  Laterality: N/A;   RECTAL EXAM UNDER ANESTHESIA N/A 12/17/2020   Procedure: ANORECTAL EXAM UNDER ANESTHESIA;  Surgeon: Andria Meuse, MD;  Location: [facility];  Service: General;  Laterality: N/A;   WISDOM TOOTH EXTRACTION      Current Medications: Current Meds  Medication Sig   ALPRAZolam (XANAX) 1 MG tablet TAKE ONE-HALF TO ONE TABLET BY MOUTH TWICE DAILY AS NEEDED FOR ANXIETY   azelastine (ASTELIN) 0.1 % nasal spray Place 1 spray into both nostrils 2 (two) times daily as needed for rhinitis. Use in each nostril as directed   dicyclomine (BENTYL)  20 MG tablet Take 1 tablet (20 mg total) by mouth 4 (four) times daily as needed for spasms.   DULoxetine (CYMBALTA) 20 MG capsule Take 1 capsule (20 mg total) by mouth daily.   OXcarbazepine ER (OXTELLAR XR) 150 MG TB24 Take 150 mg by mouth in the morning and at bedtime.     Allergies:   Pseudoephedrine, Sulfa antibiotics, Topiramate, Aimovig [erenumab-aooe], Amitriptyline, Celebrex [celecoxib], Cephalexin, Citalopram hydrobromide, Crestor [rosuvastatin], Cyclobenzaprine hcl, Diazepam, Levaquin [levofloxacin], Sumatriptan, Tizanidine, Codeine, and Venlafaxine   Social History   Socioeconomic History   Marital status: Married    Spouse name: Not on file   Number of children: Not on file   Years of education: Not on file   Highest education level: Associate degree: occupational, Scientist, product/process development, or vocational program  Occupational History   Occupation: Risk analyst, freelance work    Employer: [employer]   Occupation: PTA  Tobacco Use   Smoking status: Never   Smokeless tobacco: Never  Vaping Use   Vaping Use: Never used  Substance and Sexual Activity   Alcohol use: Yes    Alcohol/week: 0.0 standard drinks of alcohol    Comment: 1 glass of wine/month   Drug use: No   Sexual activity: Not on file  Other Topics Concern   Not on file  Social History Narrative   Long term relationship    Regular exercise- yes   Doing relief/PRN PT work.   Social Determinants of Health   Financial Resource Strain: Low Risk  (10/09/2022)   Overall Financial Resource Strain (CARDIA)    Difficulty of Paying Living Expenses: Not very hard  Food Insecurity: No Food Insecurity (10/09/2022)   Hunger Vital Sign    Worried About Running Out of Food in the Last Year: Never true    Ran Out of Food in the Last Year: Never true  Transportation Needs: No Transportation Needs (10/09/2022)   PRAPARE - Administrator, Civil Service (Medical): No    Lack of Transportation (Non-Medical): No   Physical Activity: Sufficiently Active (10/09/2022)   Exercise Vital Sign    Days of Exercise per Week: 7 days    Minutes of Exercise per Session: 150+ min  Stress: Stress Concern Present (10/09/2022)   [employer] of Occupational Health - Occupational Stress Questionnaire    Feeling of Stress : Very much  Social Connections: Unknown (10/09/2022)   Social Connection and Isolation Panel [NHANES]    Frequency of Communication with Friends and Family: More than three times a week    Frequency of Social Gatherings with Friends and Family: Once a week    Attends Religious Services: Patient declined    Active Member of  Clubs or Organizations: Patient declined    Attends Engineer, structural: Not on file    Marital Status: Married     Family History: The patient's family history includes Cancer in his mother; Esophageal varices in his mother; Heart disease in his father; Liver disease in his mother. There is no history of Colon cancer, Prostate cancer, Esophageal cancer, Rectal cancer, or Stomach cancer.  ROS:   Review of Systems  Constitution: Negative for decreased appetite, fever and weight gain.  HENT: Negative for congestion, ear discharge, hoarse voice and sore throat.   Eyes: Negative for discharge, redness, vision loss in right eye and visual halos.  Cardiovascular: Negative for chest pain, dyspnea on exertion, leg swelling, orthopnea and palpitations.  Respiratory: Negative for cough, hemoptysis, shortness of breath and snoring.   Endocrine: Negative for heat intolerance and polyphagia.  Hematologic/Lymphatic: Negative for bleeding problem. Does not bruise/bleed easily.  Skin: Negative for flushing, nail changes, rash and suspicious lesions.  Musculoskeletal: Negative for arthritis, joint pain, muscle cramps, myalgias, neck pain and stiffness.  Gastrointestinal: Negative for abdominal pain, bowel incontinence, diarrhea and excessive appetite.  Genitourinary: Negative for  decreased libido, genital sores and incomplete emptying.  Neurological: Negative for brief paralysis, focal weakness, headaches and loss of balance.  Psychiatric/Behavioral: Negative for altered mental status, depression and suicidal ideas.  Allergic/Immunologic: Negative for HIV exposure and persistent infections.    EKGs/Labs/Other Studies Reviewed:    The following studies were reviewed today:   EKG:  None today   Recent Labs: 10/09/2022: ALT 37; BUN 14; Creatinine, Ser 0.97; Hemoglobin 16.5; Platelets 196; Potassium 3.4; Sodium 138; TSH 1.930  Recent Lipid Panel    Component Value Date/Time   CHOL 155 03/25/2022 1228   TRIG 75 03/25/2022 1228   HDL 36 (L) 03/25/2022 1228   CHOLHDL 4.3 03/25/2022 1228   CHOLHDL 5 12/02/2019 1152   VLDL 27.8 12/02/2019 1152   LDLCALC 104 (H) 03/25/2022 1228    Physical Exam:    VS:  BP 136/88   Pulse 84   Ht 5\' 8"  (1.727 m)   Wt 193 lb 3.2 oz (87.6 kg)   SpO2 98%   BMI 29.38 kg/m     Wt Readings from Last 3 Encounters:  11/25/22 193 lb 3.2 oz (87.6 kg)  10/22/22 186 lb 9.6 oz (84.6 kg)  10/10/22 184 lb (83.5 kg)     GEN: Well nourished, well developed in no acute distress HEENT: Normal NECK: No JVD; No carotid bruits LYMPHATICS: No lymphadenopathy CARDIAC: S1S2 noted,RRR, no murmurs, rubs, gallops RESPIRATORY:  Clear to auscultation without rales, wheezing or rhonchi  ABDOMEN: Soft, non-tender, non-distended, +bowel sounds, no guarding. EXTREMITIES: No edema, No cyanosis, no clubbing MUSCULOSKELETAL:  No deformity  SKIN: Warm and dry NEUROLOGIC:  Alert and oriented x 3, non-focal PSYCHIATRIC:  Normal affect, good insight  ASSESSMENT:    1. Atypical chest pain    PLAN:     1.He is no longer experiencing chest pain. No need for further testing.    The patient is in agreement with the above plan. The patient left the office in stable condition.  The patient will follow up in   Medication Adjustments/Labs and Tests  Ordered: Current medicines are reviewed at length with the patient today.  Concerns regarding medicines are outlined above.  No orders of the defined types were placed in this encounter.  No orders of the defined types were placed in this encounter.   Patient Instructions  Medication Instructions:  Your physician recommends that you continue on your current medications as directed. Please refer to the Current Medication list given to you today.  *If you need a refill on your cardiac medications before your next appointment, please call your pharmacy*   Lab Work: None   Testing/Procedures: None   Follow-Up: At [facility], you and your health needs are our priority.  As part of our continuing mission to provide you with exceptional heart care, we have created designated Provider Care Teams.  These Care Teams include your primary Cardiologist (physician) and Advanced Practice Providers (APPs -  Physician Assistants and Nurse Practitioners) who all work together to provide you with the care you need, when you need it.   Your next appointment:   1 year(s)  Provider:   Thomasene Ripple, DO      Adopting a Healthy Lifestyle.  Know what a healthy weight is for you (roughly BMI <25) and aim to maintain this   Aim for 7+ servings of fruits and vegetables daily   65-80+ fluid ounces of water or unsweet tea for healthy kidneys   Limit to max 1 drink of alcohol per day; avoid smoking/tobacco   Limit animal fats in diet for cholesterol and heart health - choose grass fed whenever available   Avoid highly processed foods, and foods high in saturated/trans fats   Aim for low stress - take time to unwind and care for your mental health   Aim for 150 min of moderate intensity exercise weekly for heart health, and weights twice weekly for bone health   Aim for 7-9 hours of sleep daily   When it comes to diets, agreement about the perfect plan isnt easy to find, even among the  experts. Experts at the [facility] of [employer] developed an idea known as the Healthy Eating Plate. Just imagine a plate divided into logical, healthy portions.   The emphasis is on diet quality:   Load up on vegetables and fruits - one-half of your plate: Aim for color and variety, and remember that potatoes dont count.   Go for whole grains - one-quarter of your plate: Whole wheat, barley, wheat berries, quinoa, oats, brown rice, and foods made with them. If you want pasta, go with whole wheat pasta.   Protein power - one-quarter of your plate: Fish, chicken, beans, and nuts are all healthy, versatile protein sources. Limit red meat.   The diet, however, does go beyond the plate, offering a few other suggestions.   Use healthy plant oils, such as olive, canola, soy, corn, sunflower and peanut. Check the labels, and avoid partially hydrogenated oil, which have unhealthy trans fats.   If youre thirsty, drink water. Coffee and tea are good in moderation, but skip sugary drinks and limit milk and dairy products to one or two daily servings.   The type of carbohydrate in the diet is more important than the amount. Some sources of carbohydrates, such as vegetables, fruits, whole grains, and beans-are healthier than others.   Finally, stay active  Signed, Thomasene Ripple, DO  11/28/2022 7:33 PM    [facility]

## 2022-11-27 ENCOUNTER — Encounter: Payer: Self-pay | Admitting: Family Medicine

## 2022-12-21 ENCOUNTER — Telehealth: Payer: Self-pay | Admitting: Family Medicine

## 2022-12-21 DIAGNOSIS — F419 Anxiety disorder, unspecified: Secondary | ICD-10-CM

## 2022-12-21 NOTE — Telephone Encounter (Signed)
Please check with patient.  I checked with Dr. Servando Salina in the meantime.  She had recommended psychiatry evaluation for consideration of medications other than Cymbalta.  I cannot set up genetic testing here.  Let me know if he needs a referral.  Thanks.

## 2022-12-22 NOTE — Telephone Encounter (Signed)
Patient returned call,would like a call back 

## 2022-12-22 NOTE — Telephone Encounter (Signed)
Spoke with patient and let him know Dr. Lianne Bushy recommendations. Patient is fine with seeing psychiatry as long as they are knowledgeable to what he needs. He stated that he is off the cymbalta now completely. He gets facial pain still but is able to control is with advil; is taking about 9-12 pills a day in a three times a day span.

## 2022-12-22 NOTE — Telephone Encounter (Signed)
Left message to return call to our office.  

## 2022-12-23 NOTE — Telephone Encounter (Signed)
Patient has been notified that referral was placed.

## 2022-12-23 NOTE — Telephone Encounter (Signed)
I put in the referral.  Thanks.  

## 2022-12-23 NOTE — Addendum Note (Signed)
Addended by: Joaquim Nam on: 12/23/2022 07:54 AM   Modules accepted: Orders

## 2023-01-20 DIAGNOSIS — K610 Anal abscess: Secondary | ICD-10-CM | POA: Insufficient documentation

## 2023-01-23 ENCOUNTER — Other Ambulatory Visit: Payer: Self-pay | Admitting: Family Medicine

## 2023-01-26 NOTE — Telephone Encounter (Signed)
Sent. Thanks.   

## 2023-01-26 NOTE — Telephone Encounter (Signed)
Refill request for ALPRAZOLAM 1 MG TAB   LOV - 10/10/22 Next OV - not scheduled Last refill - 10/31/22 #30/1

## 2023-02-26 ENCOUNTER — Encounter: Payer: Self-pay | Admitting: Family Medicine

## 2023-02-26 ENCOUNTER — Ambulatory Visit (INDEPENDENT_AMBULATORY_CARE_PROVIDER_SITE_OTHER): Payer: 59 | Admitting: Family Medicine

## 2023-02-26 ENCOUNTER — Ambulatory Visit: Payer: 59 | Admitting: Family Medicine

## 2023-02-26 VITALS — BP 124/68 | HR 103 | Temp 98.6°F | Ht 68.0 in | Wt 185.0 lb

## 2023-02-26 DIAGNOSIS — E786 Lipoprotein deficiency: Secondary | ICD-10-CM

## 2023-02-26 DIAGNOSIS — Z23 Encounter for immunization: Secondary | ICD-10-CM

## 2023-02-26 DIAGNOSIS — Z7189 Other specified counseling: Secondary | ICD-10-CM

## 2023-02-26 DIAGNOSIS — R519 Headache, unspecified: Secondary | ICD-10-CM

## 2023-02-26 DIAGNOSIS — F419 Anxiety disorder, unspecified: Secondary | ICD-10-CM

## 2023-02-26 DIAGNOSIS — R41840 Attention and concentration deficit: Secondary | ICD-10-CM

## 2023-02-26 DIAGNOSIS — Z113 Encounter for screening for infections with a predominantly sexual mode of transmission: Secondary | ICD-10-CM

## 2023-02-26 DIAGNOSIS — Z0000 Encounter for general adult medical examination without abnormal findings: Secondary | ICD-10-CM

## 2023-02-26 DIAGNOSIS — Z0001 Encounter for general adult medical examination with abnormal findings: Secondary | ICD-10-CM

## 2023-02-26 LAB — LIPID PANEL
Cholesterol: 170 mg/dL (ref 0–200)
HDL: 43.9 mg/dL (ref >39.00)
LDL Cholesterol: 111 mg/dL — ABNORMAL HIGH (ref 0–99)
NonHDL: 126.23
Total CHOL/HDL Ratio: 4
Triglycerides: 74 mg/dL (ref 0.0–149.0)
VLDL: 14.8 mg/dL (ref 0.0–40.0)

## 2023-02-26 NOTE — Patient Instructions (Signed)
Go to the lab on the way out.   If you have mychart we'll likely use that to update you.    Take care.  Glad to see you. 

## 2023-02-26 NOTE — Progress Notes (Signed)
CPE- See plan.  Routine anticipatory guidance given to patient.  See health maintenance.  The possibility exists that previously documented standard health maintenance information may have been brought forward from a previous encounter into this note.  If needed, that same information has been updated to reflect the current situation based on today's encounter.    Tetanus 2024 Flu to be done this fall.   PNA not due. Shingrix d/w pt.  He'll check on coverage.    covid d/w pt.   Colonoscopy 2022 PSA 2024.   Living will d/w pt.  Justine Null designated if patient were incapacitated.   Diet and exercise d/w pt.  HIV and HCV prev done [facility] ~1991  Facial pain.  Episodic.  He is likely either accommodated or he is having less pain.  Still chewing on the L side, at baseline.    He had fun travelling in [place].    D/w pt about PREP and routine cautions.  Labs pending.  See notes on labs.  Recheck lipids pending.    He has f/u pending with [facility].  On vyvanse currently.  Prev tried concerta.    Had used xanax prn, not daily.  It helped with anxiety.  No SI/HI.    PMH and SH reviewed  Meds, vitals, and allergies reviewed.   ROS: Per HPI.  Unless specifically indicated otherwise in HPI, the patient denies:  General: fever. Eyes: acute vision changes ENT: sore throat Cardiovascular: chest pain Respiratory: SOB GI: vomiting GU: dysuria Musculoskeletal: acute back pain Derm: acute rash Neuro: acute motor dysfunction Psych: worsening mood Endocrine: polydipsia Heme: bleeding Allergy: hayfever  GEN: nad, alert and oriented HEENT: ncat NECK: supple w/o LA CV: rrr. PULM: ctab, no inc wob ABD: soft, +bs EXT: no edema SKIN: no acute rash L clavivle prominence.

## 2023-02-28 LAB — RPR: RPR Ser Ql: NONREACTIVE

## 2023-02-28 LAB — HIV ANTIBODY (ROUTINE TESTING W REFLEX): HIV 1&2 Ab, 4th Generation: NONREACTIVE

## 2023-02-28 LAB — C. TRACHOMATIS/N. GONORRHOEAE RNA
C. trachomatis RNA, TMA: NOT DETECTED
N. gonorrhoeae RNA, TMA: NOT DETECTED

## 2023-03-01 DIAGNOSIS — R41840 Attention and concentration deficit: Secondary | ICD-10-CM | POA: Insufficient documentation

## 2023-03-01 NOTE — Assessment & Plan Note (Signed)
Tetanus 2024 Flu to be done this fall.   PNA not due. Shingrix d/w pt.  He'll check on coverage.    covid d/w pt.   Colonoscopy 2022 PSA 2024.   Living will d/w pt.  [name] designated if patient were incapacitated.   Diet and exercise d/w pt.  HIV and HCV prev done at [facility] ~1991

## 2023-03-01 NOTE — Assessment & Plan Note (Signed)
Continue as needed use of alprazolam.  Okay for outpatient follow-up.

## 2023-03-01 NOTE — Assessment & Plan Note (Signed)
He has f/u pending with [facility].  On vyvanse currently.  Prev tried concerta.  I will await follow-up report.

## 2023-03-01 NOTE — Assessment & Plan Note (Signed)
Living will d/w pt.  [name] designated if patient were incapacitated.

## 2023-03-01 NOTE — Assessment & Plan Note (Signed)
Fortunately this has improved in the meantime.

## 2023-03-03 ENCOUNTER — Other Ambulatory Visit (INDEPENDENT_AMBULATORY_CARE_PROVIDER_SITE_OTHER): Payer: Self-pay

## 2023-03-03 ENCOUNTER — Encounter: Payer: Self-pay | Admitting: Orthopaedic Surgery

## 2023-03-03 ENCOUNTER — Ambulatory Visit: Payer: 59 | Admitting: Orthopaedic Surgery

## 2023-03-03 DIAGNOSIS — M898X1 Other specified disorders of bone, shoulder: Secondary | ICD-10-CM

## 2023-03-03 NOTE — Progress Notes (Signed)
Office Visit Note   Patient: [name]           Date of Birth: 02-11-1971           MRN: [id number] Visit Date: 03/03/2023              Requested by: Joaquim Nam, MD [address] [place],  [place] [address] PCP: Joaquim Nam, MD   Assessment & Plan: Visit Diagnoses:  1. Pain of left clavicle     Plan: [name] is a 52 year old gentleman with symptomatic left [place] joint instability.  Has a prior malunion of the left clavicle.  He is looking to explore surgical options to prevent posttraumatic arthritis or problems in general.  I explained that I will send a referral to Dr. Steward Drone for further evaluation and treatment as I have little to no expertise in this matter.  Follow-Up Instructions: No follow-ups on file.   Orders:  Orders Placed This Encounter  Procedures   XR Clavicle Left   No orders of the defined types were placed in this encounter.     Procedures: No procedures performed   Clinical Data: No additional findings.   Subjective: Chief Complaint  Patient presents with   Left Shoulder - Pain    HPI [name] is a 52 year old gentleman who comes in for symptomatic instability to the left [place] joint.  He had a left clavicle fracture in 1992 that was treated nonoperatively.  He works as a Comptroller.  He states that he has been managing for the last 6 weeks.  He has been experiencing dislocating sensation from the [facility] joint.  He has been seeing Dr. Althea Charon at [facility] orthopedics for injections.  He wants to explore any potential options for prevention of posttraumatic problems.  Review of Systems  Constitutional: Negative.   HENT: Negative.    Eyes: Negative.   Respiratory: Negative.    Cardiovascular: Negative.   Gastrointestinal: Negative.   Endocrine: Negative.   Genitourinary: Negative.   Skin: Negative.   Allergic/Immunologic: Negative.   Neurological: Negative.   Hematological: Negative.   Psychiatric/Behavioral:  Negative.    All other systems reviewed and are negative.    Objective: Vital Signs: There were no vitals taken for this visit.  Physical Exam Vitals and nursing note reviewed.  Constitutional:      Appearance: He is well-developed.  HENT:     Head: Normocephalic and atraumatic.  Eyes:     Pupils: Pupils are equal, round, and reactive to light.  Pulmonary:     Effort: Pulmonary effort is normal.  Abdominal:     Palpations: Abdomen is soft.  Musculoskeletal:        General: Normal range of motion.     Cervical back: Neck supple.  Skin:    General: Skin is warm.  Neurological:     Mental Status: He is alert and oriented to person, place, and time.  Psychiatric:        Behavior: Behavior normal.        Thought Content: Thought content normal.        Judgment: Judgment normal.     Ortho Exam Exam of the left shoulder shows full range of motion.  Normal strength to manual muscle testing.  Slight click to the [facility] joint with shoulder range of motion.  No swelling or signs of infection. Specialty Comments:  No specialty comments available.  Imaging: No results found.   PMFS History: Patient Active Problem List  Diagnosis Date Noted   Impaired concentration 03/01/2023   Perianal abscess 01/20/2023   Flank pain 05/14/2022   Other social stressor 04/03/2021   Routine general medical examination at a health care facility 03/03/2021   Esophageal spasm 01/30/2021   Cervical spondylosis 12/04/2020   Rectal pain 10/29/2020   History of cholecystectomy    Ulnar neuropathy    Tear of meniscus, medial    Nephrolithiasis    Complication of anesthesia    Allergy    S/P hernia repair 05/03/2020   Tinnitus of both ears 03/30/2020   Other specified abdominal hernia without obstruction or gangrene 02/28/2020   Facial pain 02/06/2020   Abdominal wall hernia 12/05/2019   FH: CAD (coronary artery disease) 12/05/2019   FHx: thyroid disease 12/05/2019   Back pain 12/05/2019    Aches 05/12/2018   Encounter for general adult medical examination with abnormal findings 06/14/2017   Advance care planning 06/14/2017   Rash 02/07/2017   TMJ tenderness 08/13/2014   Nasal septal deviation 01/25/2014   Unspecified inflammatory spondylopathy, 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  Justine Null designated if patient were incapacitated.     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    Inattention 03/11/2011   Joint pain 08/13/2014   Medial meniscus tear    left   Migraine headache 01/22/2010   Qualifier: Diagnosis of  By: Etheleen Mayhew CMA (AAMA), [place]     Nasal septal deviation 01/25/2014   Neck pain 01/05/2019   Other specified abdominal hernia without obstruction or gangrene 02/28/2020   SHOULDER PAIN, LEFT 01/22/2010   Per [facility]     Skin lesion 02/07/2017   Tear of meniscus, medial    left knee   Temporal headache 02/06/2020   Ulnar neuropathy    mild symptoms    Unspecified inflammatory spondylopathy, sacral and sacrococcygeal region [facility]) 12/05/2010   Formatting of this note might be different from the original. Overview:  Per [facility]   Wears glasses 12/11/2020   White coat syndrome with high blood pressure but without hypertension     Family History  Problem Relation Age of Onset   Cancer Mother         gyn cancer   Liver disease Mother        NASH   Esophageal varices Mother        due to spleen and liver fuctions not working properly    Heart disease Father    Colon cancer Neg Hx    Prostate cancer Neg Hx    Esophageal cancer Neg Hx    Rectal cancer Neg Hx    Stomach cancer Neg Hx     Past Surgical History:  Procedure Laterality Date   ABDOMINAL SURGERY  04/2020   linea alba surgery. Dr Cliffton Asters  [facility]    ANAL FISTULOTOMY N/A 12/17/2020   Procedure: PARTIAL ANAL FISTULOTOMY;  Surgeon: Andria Meuse, MD;  Location: [facility];  Service: General;  Laterality: N/A;   APPENDECTOMY  2012   lap append   CHOLECYSTECTOMY  2009   CLOSED REDUCTION CLAVICLE FRACTURE  1993   left   COLONOSCOPY  2008   ESOPHAGOGASTRODUODENOSCOPY     1990[place]    GALLBLADDER SURGERY  2008   HERNIA REPAIR  2011   repair from gallbladder surgery   HERNIA REPAIR  2013   umbilical   HERNIA REPAIR  2021   VENTRAL HERNIA REPAIR   LITHOTRIPSY  2000   mesh surgery  2011   abdominal   NASAL SEPTOPLASTY W/ TURBINOPLASTY Bilateral 01/25/2014   Procedure: NASAL SEPTOPLASTY/BILATERAL INFERIOR TURBINATE REDUCTION;  Surgeon: Osborn Coho, MD;  Location: [facility];  Service: ENT;  Laterality: Bilateral;   PLACEMENT OF SETON N/A 12/17/2020   Procedure: Jaquita Folds OF CUTTING SETON;  Surgeon: Andria Meuse, MD;  Location:  [facility];  Service: General;  Laterality: N/A;   RECTAL EXAM UNDER ANESTHESIA N/A 12/17/2020   Procedure: ANORECTAL EXAM UNDER ANESTHESIA;  Surgeon: Andria Meuse, MD;  Location: [facility];  Service: General;  Laterality: N/A;   WISDOM TOOTH EXTRACTION     Social History   Occupational History   Occupation: Risk analyst, freelance work    Employer: [employer]   Occupation: PTA  Tobacco Use   Smoking status: Never   Smokeless tobacco: Never  Vaping Use    Vaping status: Never Used  Substance and Sexual Activity   Alcohol use: Yes    Alcohol/week: 0.0 standard drinks of alcohol    Comment: 1 glass of wine/month   Drug use: No   Sexual activity: Not on file

## 2023-03-17 ENCOUNTER — Encounter: Payer: Self-pay | Admitting: Family Medicine

## 2023-03-18 ENCOUNTER — Other Ambulatory Visit: Payer: Self-pay | Admitting: Family Medicine

## 2023-03-20 ENCOUNTER — Ambulatory Visit (HOSPITAL_BASED_OUTPATIENT_CLINIC_OR_DEPARTMENT_OTHER): Payer: 59 | Admitting: Orthopaedic Surgery

## 2023-03-22 ENCOUNTER — Other Ambulatory Visit: Payer: Self-pay | Admitting: Family Medicine

## 2023-03-22 MED ORDER — GUANFACINE HCL ER 1 MG PO TB24: 1.0000 mg | ORAL_TABLET | Freq: Every day | ORAL | Status: DC

## 2023-03-30 ENCOUNTER — Encounter: Payer: Self-pay | Admitting: Family Medicine

## 2023-03-30 ENCOUNTER — Ambulatory Visit (INDEPENDENT_AMBULATORY_CARE_PROVIDER_SITE_OTHER): Payer: 59 | Admitting: Family Medicine

## 2023-03-30 VITALS — BP 138/78 | HR 67 | Temp 99.0°F | Ht 68.0 in | Wt 191.4 lb

## 2023-03-30 DIAGNOSIS — F988 Other specified behavioral and emotional disorders with onset usually occurring in childhood and adolescence: Secondary | ICD-10-CM

## 2023-03-30 MED ORDER — GUANFACINE HCL ER 1 MG PO TB24: 1.0000 mg | ORAL_TABLET | Freq: Every day | ORAL | 4 refills | Status: DC

## 2023-03-30 NOTE — Progress Notes (Unsigned)
He didn't tolerate vyvanse. Was changed to intuniv but ran out of med in the meantime.  He has attention clinic f/u pending.  D/w pt about options.  He didn't have ADE on intuniv.    Isn't taking gabapentin currently.    Meds, vitals, and allergies reviewed.   ROS: Per HPI unless specifically indicated in ROS section

## 2023-03-30 NOTE — Patient Instructions (Signed)
I would restart intuniv in the meantime.  Take care.  Glad to see you.

## 2023-04-01 DIAGNOSIS — F988 Other specified behavioral and emotional disorders with onset usually occurring in childhood and adolescence: Secondary | ICD-10-CM | POA: Insufficient documentation

## 2023-04-01 NOTE — Assessment & Plan Note (Addendum)
I would restart intuniv in the meantime.   Prescription sent.  Routine cautions given to patient.  He can update me as needed.

## 2023-04-09 IMAGING — CT CT BIOPSY
1 of 4 series · 14 of 32 positions shown, 19 images · non-contrast
Comparison: none

CLINICAL DATA: 50-year-old gentleman with chronic right sacroiliac
joint pain. He presents today for diagnostic evaluation to determine
if the right sacroiliac joint is in fact the source of his chronic
pain. We will perform CT-guided SI joint injection of local
anesthetic.

[Series 2: needle -guided injection · axial · 0.70mm/px · z∈[-113,-19]mm · 14 of 55 slices shown, 19 images]
[im 4/55  soft-tissue]
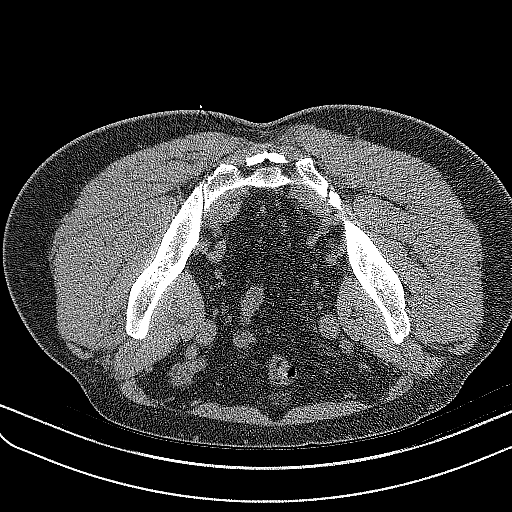
[im 4/55  bone]
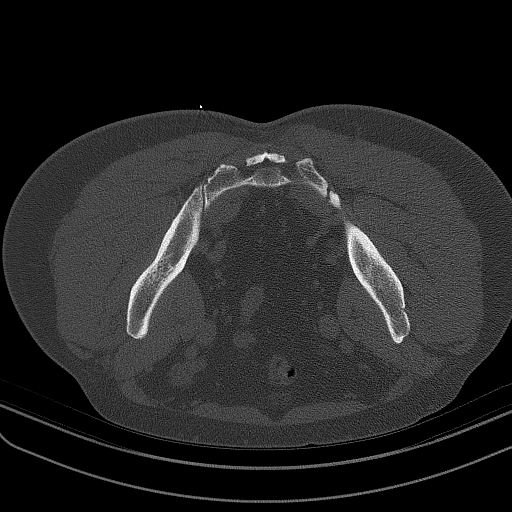
[im 7/55  soft-tissue]
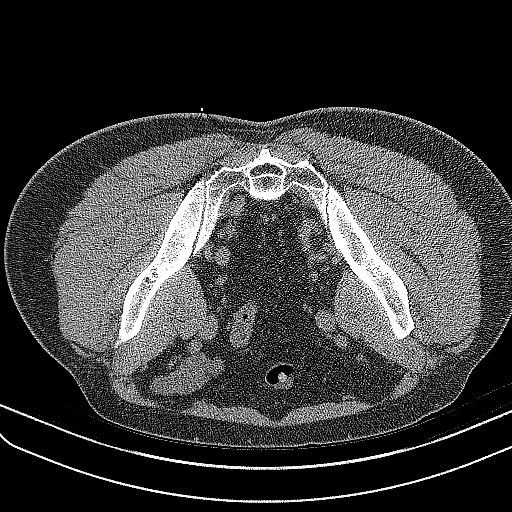
[im 11/55  soft-tissue]
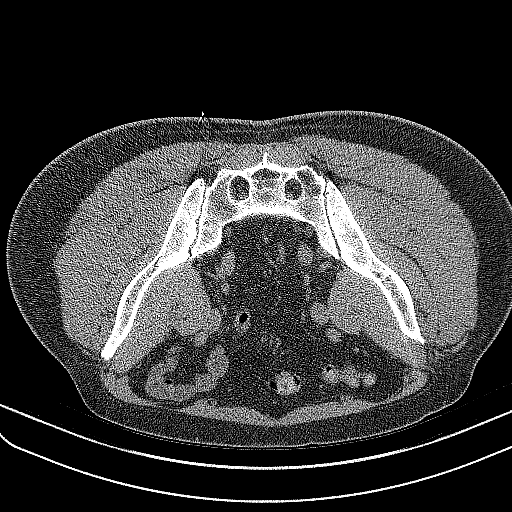
[im 17/55  soft-tissue]
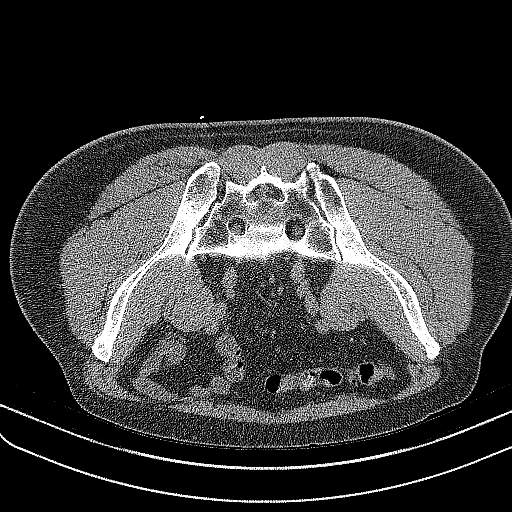
[im 21/55  soft-tissue]
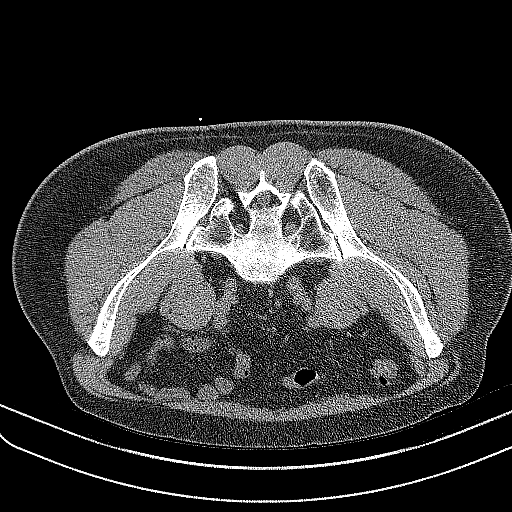
[im 24/55  soft-tissue]
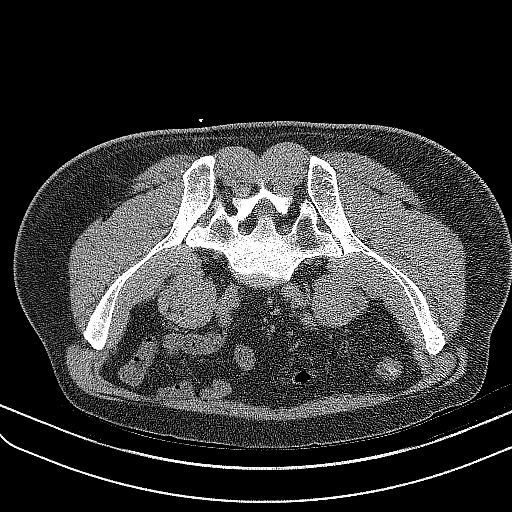
[im 28/55  soft-tissue]
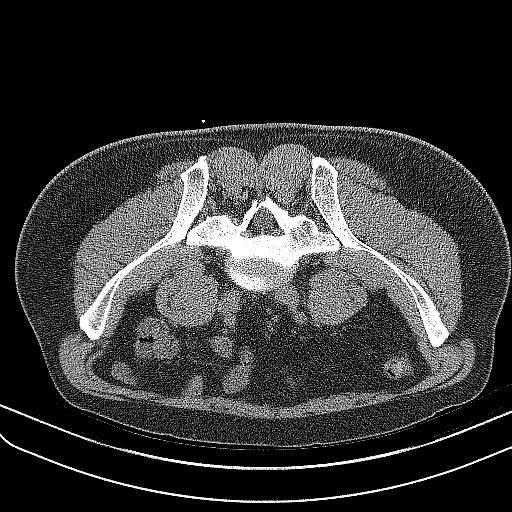
[im 31/55  soft-tissue]
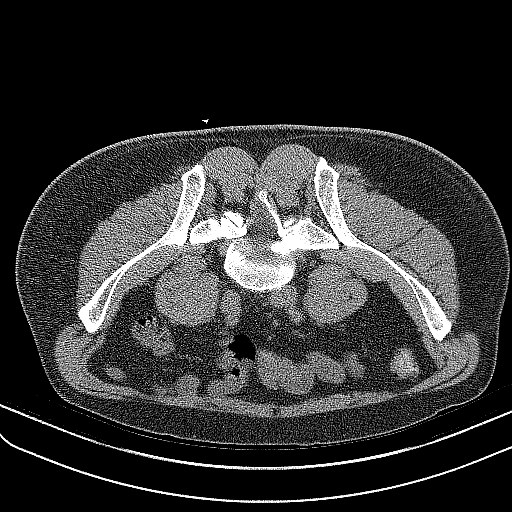
[im 34/55  soft-tissue]
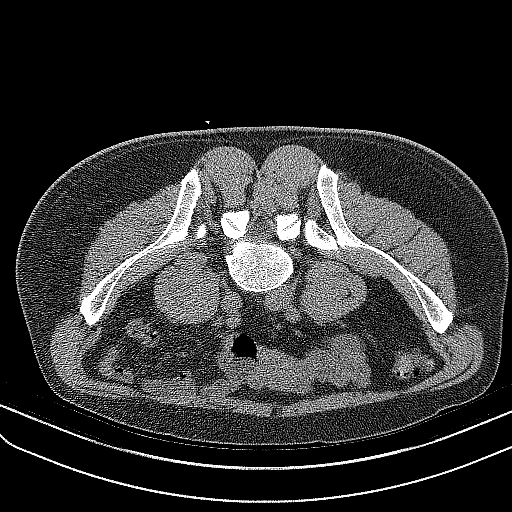
[im 34/55  bone]
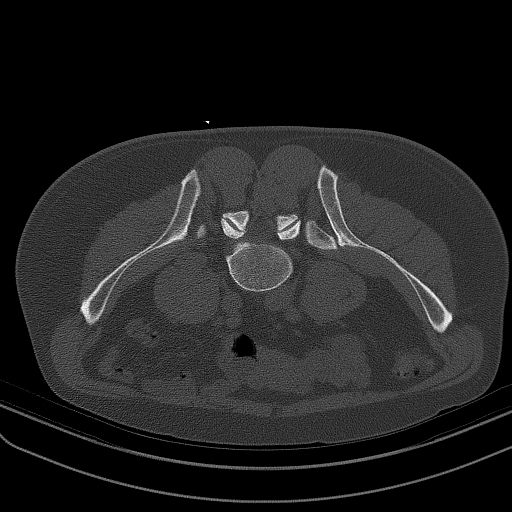
[im 38/55  soft-tissue]
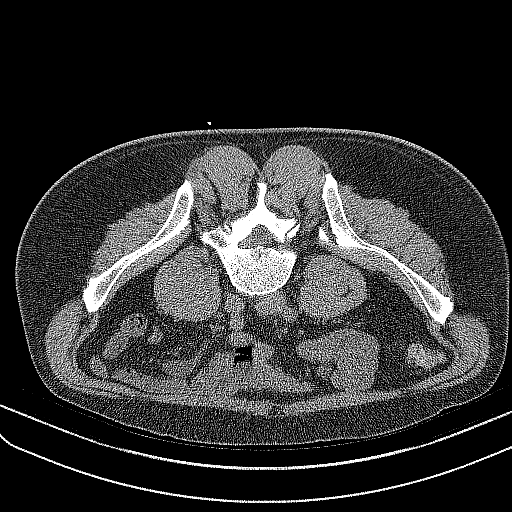
[im 41/55  lung]
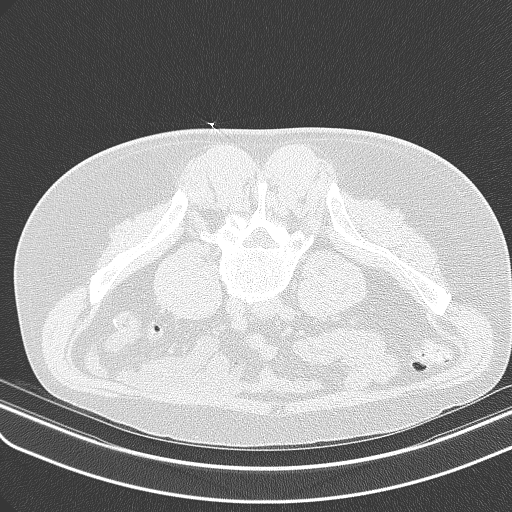
[im 44/55  soft-tissue]
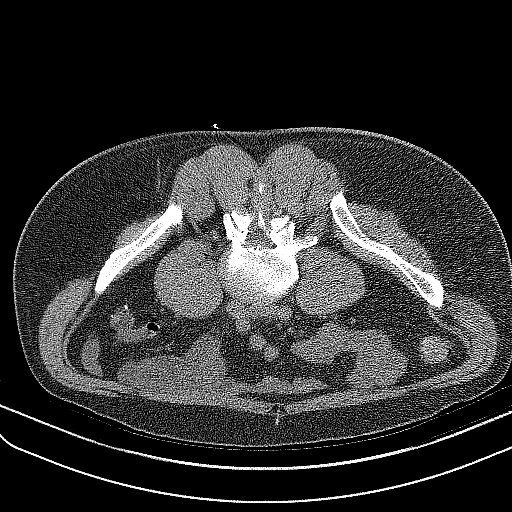
[im 44/55  lung]
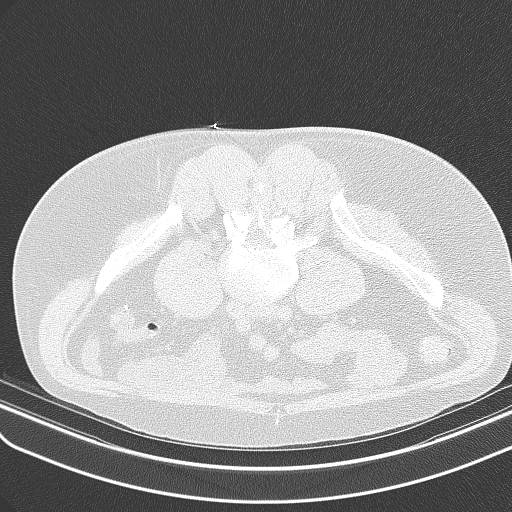
[im 48/55  soft-tissue]
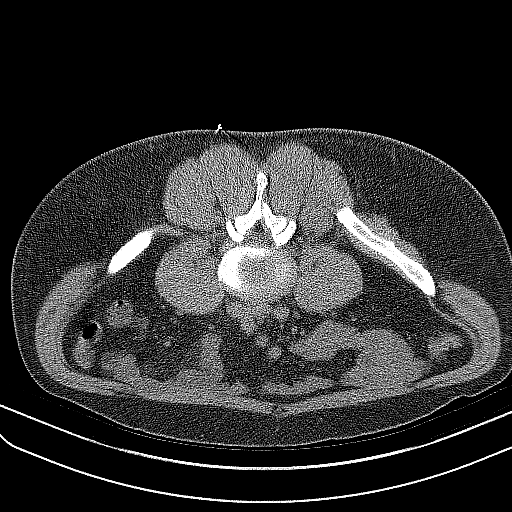
[im 48/55  lung]
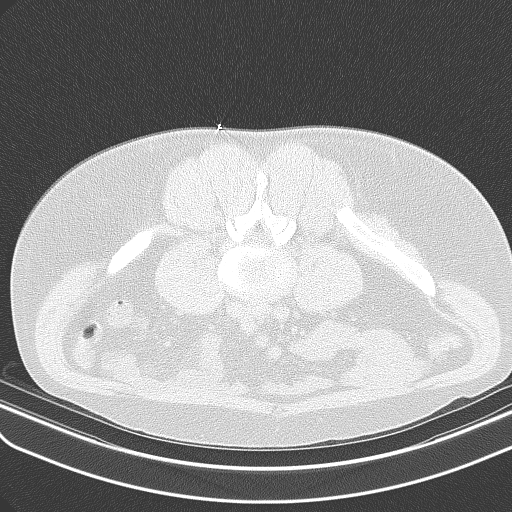
[im 51/55  soft-tissue]
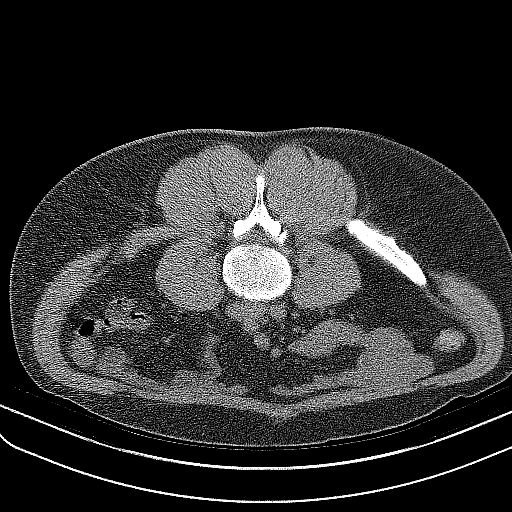
[im 51/55  lung]
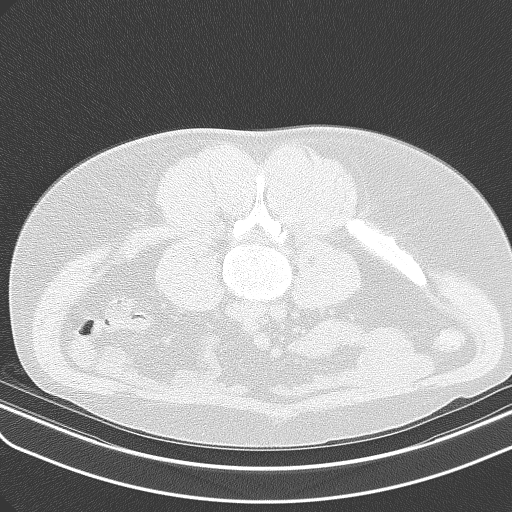

[14 of 32 positions shown; findings below may reference images not displayed]

EXAM:
Right CT GUIDED SI JOINT INJECTION



After local anesthesia with 1% lidocaine without epinephrine and
subsequent deep anesthesia, a 3.5 inch curved 22 gauge spinal needle
was advanced into the right SI joint under intermittent CT guidance.

Once the needle was in satisfactory position, representative image
was captured with the needle demonstrated in the sacroiliac joint.
Subsequently, 1 mL 0.25% bupivacaine was injected into the right SI
joint. Needles removed and a sterile dressing applied.

No complications were observed.
IMPRESSION: Successful CT-guided right SI joint injection.

## 2023-04-25 ENCOUNTER — Other Ambulatory Visit: Payer: Self-pay | Admitting: Family Medicine

## 2023-04-28 NOTE — Telephone Encounter (Signed)
Refill request for ALPRAZOLAM 1 MG TAB   LOV - 03/30/23 Next OV - not scheduled Last refill - 01/26/23 #30/1

## 2023-05-11 ENCOUNTER — Encounter: Payer: Self-pay | Admitting: Family Medicine

## 2023-05-11 ENCOUNTER — Ambulatory Visit (INDEPENDENT_AMBULATORY_CARE_PROVIDER_SITE_OTHER): Payer: 59 | Admitting: Family Medicine

## 2023-05-11 VITALS — BP 114/62 | HR 69 | Temp 98.9°F | Ht 68.0 in | Wt 189.2 lb

## 2023-05-11 DIAGNOSIS — F419 Anxiety disorder, unspecified: Secondary | ICD-10-CM | POA: Diagnosis not present

## 2023-05-11 DIAGNOSIS — R234 Changes in skin texture: Secondary | ICD-10-CM | POA: Diagnosis not present

## 2023-05-11 DIAGNOSIS — M549 Dorsalgia, unspecified: Secondary | ICD-10-CM | POA: Diagnosis not present

## 2023-05-11 DIAGNOSIS — F988 Other specified behavioral and emotional disorders with onset usually occurring in childhood and adolescence: Secondary | ICD-10-CM

## 2023-05-11 MED ORDER — PREDNISONE 20 MG PO TABS: ORAL_TABLET | ORAL | 0 refills | Status: DC

## 2023-05-11 MED ORDER — TIZANIDINE HCL 4 MG PO TABS: 4.0000 mg | ORAL_TABLET | Freq: Every day | ORAL | Status: DC | PRN

## 2023-05-11 NOTE — Patient Instructions (Addendum)
Keep your heel clean and covered and it should heal over.  Take care.  Glad to see you. Stop meloxicam and start prednisone.

## 2023-05-11 NOTE — Progress Notes (Unsigned)
R heel pain.  Cracked skin.  R posterior heel, 1.5cm superficial crack w/o ulceration or spreading redness.    He had fatigue with inc in intuniv, then declined restarting 10mg  vyvanse.  Discussed.  He had more back pain after working in the yard. Restarted mobic in the meantime but that wasn't fully resolving his back pain.  B lower midline pain.  No radicular pain.    He was frustrated about the above.  Mood d/w pt.  He had testing done in the meantime- I want to review his outside records re: zoloft use.    Meds, vitals, and allergies reviewed.   ROS: Per HPI unless specifically indicated in ROS section   Nad Ncat Neck supple, no LA Rrr Ctab Normal hip flexion and straight leg raise negative bilaterally.  Able to bear weight with strength and sensation grossly intact in lower extremities. Right posterior heel with superficial 1.5 cm skin cracking without ulceration or spreading redness.  No purulent discharge.

## 2023-05-14 DIAGNOSIS — R234 Changes in skin texture: Secondary | ICD-10-CM | POA: Insufficient documentation

## 2023-05-14 NOTE — Assessment & Plan Note (Signed)
He had testing done in the meantime- I want to review his outside records re: zoloft use.

## 2023-05-14 NOTE — Assessment & Plan Note (Signed)
Per outside clinic.  He had fatigue with increasing intuitive dose.  He declined restarting Vyvanse.

## 2023-05-14 NOTE — Assessment & Plan Note (Signed)
Stop meloxicam and change to prednisone with routine steroid cautions and update me as needed.  He agrees.

## 2023-05-14 NOTE — Assessment & Plan Note (Signed)
Should resolve with routine care.

## 2023-05-18 ENCOUNTER — Other Ambulatory Visit: Payer: Self-pay | Admitting: Family Medicine

## 2023-05-18 NOTE — Telephone Encounter (Signed)
Please update patient.  I looked back at his outside lab data.  Reasonable to try sertraline 25 mg.  If he consents, then please let me know so I can sign the order.  Thanks.

## 2023-05-18 NOTE — Telephone Encounter (Signed)
Left voicemail for patient to return call to office. 

## 2023-05-19 NOTE — Telephone Encounter (Signed)
Patient called in returning call he received. Relayed message below to patient and he stated that he is fine with sertraline 25 mg.

## 2023-05-19 NOTE — Telephone Encounter (Signed)
Patient is okay with the 25mg 

## 2023-05-20 MED ORDER — SERTRALINE HCL 25 MG PO TABS: 25.0000 mg | ORAL_TABLET | Freq: Every day | ORAL | 1 refills | Status: DC

## 2023-05-20 NOTE — Telephone Encounter (Signed)
Sent. Thanks.  Please have him update [place] in about 4 weeks, sooner if needed.

## 2023-05-20 NOTE — Telephone Encounter (Signed)
Spoke with patient to advise that medication has been sent to the pharmacy and to check back in about 4 weeks to let us know how he is tolerating the medication. Patient understood.

## 2023-06-05 ENCOUNTER — Encounter: Payer: Self-pay | Admitting: Family Medicine

## 2023-06-05 ENCOUNTER — Telehealth: Payer: 59 | Admitting: Family Medicine

## 2023-06-05 VITALS — BP 110/72 | Ht 68.0 in | Wt 184.0 lb

## 2023-06-05 DIAGNOSIS — F988 Other specified behavioral and emotional disorders with onset usually occurring in childhood and adolescence: Secondary | ICD-10-CM | POA: Diagnosis not present

## 2023-06-05 DIAGNOSIS — M549 Dorsalgia, unspecified: Secondary | ICD-10-CM | POA: Diagnosis not present

## 2023-06-05 DIAGNOSIS — J309 Allergic rhinitis, unspecified: Secondary | ICD-10-CM | POA: Diagnosis not present

## 2023-06-05 MED ORDER — PREDNISONE 20 MG PO TABS: ORAL_TABLET | ORAL | 0 refills | Status: DC

## 2023-06-05 MED ORDER — AZELASTINE HCL 0.1 % NA SOLN: 1.0000 | Freq: Two times a day (BID) | NASAL | 5 refills | Status: AC | PRN

## 2023-06-05 NOTE — Progress Notes (Signed)
 Virtual visit completed through caregility or similar program Patient location: home  Provider location: [place] at [facility], office  Participants: Patient and me (unless stated otherwise below)  Limitations and rationale for visit method d/w patient.  Patient agreed to proceed.  Patient identified by 2 identifiers. If vitals are not listed, then patient was unable to self-report due to a lack of equipment at home via telehealth  CC: follow up/back pain.   HPI:  He had sig lack of motivation on sertraline .  He had to stop in the meantime.  Med list updated.  Done with prev prednisone  course.  He is better but not back to baseline.  Still using back brace.    Current back pain is R paraspinal area.  No L or midline back pain.  Pain with twisting.  Rare leg pain.  No FCNAVD.  No dysuria.    He needed refill on azelastine .  It helped with rhinorrhea prev.  Prescription sent.  He has attention clinic f/u pending. He is still on intuniv  1mg  per day.    Meds and allergies reviewed.   ROS: Per HPI unless specifically indicated in ROS section   NAD Speech wnl  A/P: Back pain.  Improved from prior but not resolved.  He was able to tolerate prednisone .  D/w pt about restart prednisone .  Prescription sent.  Continue stretching and update me as needed.  Refill sent on azelastine .  He has attention clinic follow-up pending.

## 2023-06-07 NOTE — Assessment & Plan Note (Signed)
 Back pain.  Improved from prior but not resolved.  He was able to tolerate prednisone.  D/w pt about restart prednisone.  Prescription sent.  Continue stretching and update me as needed.

## 2023-06-07 NOTE — Assessment & Plan Note (Signed)
 He has attention clinic follow-up pending.

## 2023-06-07 NOTE — Assessment & Plan Note (Signed)
  Refill sent on azelastine.

## 2023-07-27 ENCOUNTER — Ambulatory Visit: Payer: 59 | Admitting: Family Medicine

## 2023-07-27 ENCOUNTER — Encounter: Payer: Self-pay | Admitting: Family Medicine

## 2023-07-27 VITALS — BP 130/80 | HR 97 | Temp 98.6°F | Wt 191.2 lb

## 2023-07-27 DIAGNOSIS — J3489 Other specified disorders of nose and nasal sinuses: Secondary | ICD-10-CM | POA: Diagnosis not present

## 2023-07-27 DIAGNOSIS — M549 Dorsalgia, unspecified: Secondary | ICD-10-CM | POA: Diagnosis not present

## 2023-07-27 MED ORDER — MELOXICAM 7.5 MG PO TABS: 7.5000 mg | ORAL_TABLET | Freq: Every day | ORAL | Status: DC

## 2023-07-27 MED ORDER — ALPRAZOLAM ER 0.5 MG PO TB24: 0.5000 mg | ORAL_TABLET | Freq: Every day | ORAL | Status: DC | PRN

## 2023-07-27 MED ORDER — PREDNISONE 10 MG PO TABS: ORAL_TABLET | ORAL | 0 refills | Status: DC

## 2023-07-27 MED ORDER — ALPRAZOLAM 1 MG PO TABS: ORAL_TABLET | ORAL | Status: DC

## 2023-07-27 MED ORDER — DOXYCYCLINE HYCLATE 100 MG PO TABS: 100.0000 mg | ORAL_TABLET | Freq: Two times a day (BID) | ORAL | 0 refills | Status: DC

## 2023-07-27 NOTE — Progress Notes (Unsigned)
 Taking 15mg  meloxicam in the meantime.  It helped his neck pain and sciatica and he isn't taking other nsaids.  He is trying to put off RF ablation.  He is still doing PT exercises.  No ADE on med.    Med list updated.  He is off guanfacine in the meantime.    Other sx started about 6 weeks ago.  Sinus pain and pressure.  Taking decongestants episodically.  No fevers.  Some aches.  No cough.  No sputum.  Some thicker mucous in the AMs, yellowish.  Some ear ringing and congestion.  Already taking xyzal and Astelin.  Leaving on vacation in about 11 days.  Sx started after working in the yard.   Meds, vitals, and allergies reviewed.   ROS: Per HPI unless specifically indicated in ROS section   Nad Ncat Neck supple, no LA R>L max sinus ttp.   TM wnl OP wnl, MMM Nasal injection B Rrr Ctab Skin well perfused.   30 minutes were devoted to patient care in this encounter (this includes time spent reviewing the patient's file/history, interviewing and examining the patient, counseling/reviewing plan with patient).

## 2023-07-27 NOTE — Patient Instructions (Signed)
 Start prednisone, hold meloxicam.  If not any better, then start doxy. Update [place] as needed.  Take care.  Glad to see you.

## 2023-07-29 DIAGNOSIS — J3489 Other specified disorders of nose and nasal sinuses: Secondary | ICD-10-CM | POA: Insufficient documentation

## 2023-07-29 NOTE — Assessment & Plan Note (Signed)
 With neck pain and h/o sciatica.  Start prednisone for sinus pressure, hold meloxicam in the meantime.  Can restart meloxicam later, nsaid cautions d/w pt.

## 2023-07-29 NOTE — Assessment & Plan Note (Signed)
 Discussed options.  Start prednisone, hold meloxicam while on prednisone.  Steroid cautions d/w pt.   If not any better, then start doxy. Update clinic as needed.  He agrees.

## 2023-09-18 ENCOUNTER — Other Ambulatory Visit: Payer: Self-pay | Admitting: Family Medicine

## 2023-11-03 ENCOUNTER — Ambulatory Visit: Admitting: Family Medicine

## 2023-11-05 ENCOUNTER — Ambulatory Visit: Admitting: Family Medicine

## 2023-11-05 ENCOUNTER — Encounter: Payer: Self-pay | Admitting: Family Medicine

## 2023-11-05 VITALS — BP 128/72 | HR 88 | Temp 99.5°F | Ht 68.0 in | Wt 188.2 lb

## 2023-11-05 DIAGNOSIS — M25551 Pain in right hip: Secondary | ICD-10-CM | POA: Diagnosis not present

## 2023-11-05 DIAGNOSIS — G8929 Other chronic pain: Secondary | ICD-10-CM | POA: Diagnosis not present

## 2023-11-05 MED ORDER — PREDNISONE 10 MG PO TABS: ORAL_TABLET | ORAL | 0 refills | Status: DC

## 2023-11-05 NOTE — Patient Instructions (Addendum)
 Please ask the front for a records release re: xrays and notes from [facility] and [facility] for the the last year.  Take care.  Glad to see you. Let me see about getting the MRI set up.   Restart prednisone  in the meantime.

## 2023-11-05 NOTE — Progress Notes (Addendum)
 D/w pt about R hip pain, back pain.  Saw ortho in the meantime. He is going to get a second opinion.    He is trying to get injection done through spine clinic.    Previous MRI showed degenerative disc disease with moderate nerve impingement at L1-L2.  Mild impingement at other levels.  Discussed.  He had lateral clavicle pain, bilaterally.  He had R elbow pain.    He had clear improvement while taking prednisone . Nsaids didn't help as much.    Off xanax  for the last ~5 weeks.  Sleeping better in the meantime.   He had previous plain films done at ortho.  Requesting records.  Hip pain has been going on for approximately 3 months.  Meds, vitals, and allergies reviewed.   ROS: Per HPI unless specifically indicated in ROS section   Nad Ncat Rrr Ctab Right hip pain with flexion/extension, int/ext rotation.  He can still bear weight.   Greater trochanteric area not tender.

## 2023-11-06 ENCOUNTER — Encounter: Payer: Self-pay | Admitting: Cardiology

## 2023-11-08 DIAGNOSIS — G8929 Other chronic pain: Secondary | ICD-10-CM | POA: Insufficient documentation

## 2023-11-08 NOTE — Assessment & Plan Note (Signed)
 He has chronic ongoing right hip pain that has been steroid responsive.  He has other joint pain but we agreed to defer rheumatology referral until we can finish the evaluation for his hip.  Since he has recurrent reproducible symptoms with concern for soft tissue/cartilage pathology, we will order MRI right hip.  Restart prednisone  in the meantime.  Requesting report from plain films that were done at outside clinic.  He agrees with plan.

## 2023-11-09 ENCOUNTER — Encounter: Payer: Self-pay | Admitting: Family Medicine

## 2023-11-11 ENCOUNTER — Encounter: Payer: Self-pay | Admitting: Family Medicine

## 2023-11-11 ENCOUNTER — Telehealth: Payer: Self-pay | Admitting: Family Medicine

## 2023-11-11 ENCOUNTER — Other Ambulatory Visit: Payer: Self-pay | Admitting: Family Medicine

## 2023-11-11 DIAGNOSIS — G8929 Other chronic pain: Secondary | ICD-10-CM

## 2023-11-11 NOTE — Telephone Encounter (Signed)
 Please check with radiology [phone] to make sure that the MRI that is ordered can adequately evaluate his labrum on his hip.  Let me know if I need to change the order to an MR arthrogram.  Thanks.

## 2023-11-11 NOTE — Telephone Encounter (Signed)
 Looks like patient has MRI scheduled not sure if it is one that is needed.

## 2023-11-12 ENCOUNTER — Encounter: Payer: Self-pay | Admitting: Orthopaedic Surgery

## 2023-11-12 NOTE — Telephone Encounter (Signed)
 Reached out to the reading room and the stated although both MRI will show the best one would be the MRI arthrogram.

## 2023-11-13 ENCOUNTER — Other Ambulatory Visit: Payer: Self-pay

## 2023-11-13 NOTE — Telephone Encounter (Signed)
 Please call radiology and either get the order number for the arthrogram so I can order it or see if they can change the order to an arthrogram.   I can't get the order to load in the EMR for MR hip arthrogram.  Thanks.

## 2023-11-13 NOTE — Telephone Encounter (Signed)
 Thanks.  I put in the orders.  I can't cancel the prev MRI since it is already scheduled.   Please update patient and the referral coordinators to see about cancelling the upcoming scan and getting the new imaging scheduled.    I thank all involved.

## 2023-11-13 NOTE — Telephone Encounter (Signed)
 Reached out to [facility] where his appointment is scheduled. I spoke with Ardelia Beau and they will reach out to patient and reschedule him for the appropriate test.

## 2023-11-13 NOTE — Telephone Encounter (Signed)
 I spoke with [facility](Erica) the order has to be placed with two parts: MRI HIP Right with contrast and then add the order number [id number].  Just an FYI the patient has already been scheduled for the MRI Right hip without contrast for Monday. So changing the order the patient will have to be scheduled for another day because the arthogram is only done on certain days

## 2023-11-13 NOTE — Telephone Encounter (Signed)
 Noted. Thanks.

## 2023-11-16 ENCOUNTER — Other Ambulatory Visit

## 2023-11-18 NOTE — Telephone Encounter (Signed)
 Please see MyChart message.  Please contact [name]'s office to see if they can get the MRI arthrogram approved.

## 2023-11-19 NOTE — Telephone Encounter (Signed)
Thank you.  Please let me know what you hear. 

## 2023-11-19 NOTE — Telephone Encounter (Signed)
 Reached out to [facility] office [phone] and spoke with Diego Foy. I did advise that I needed the provider or that office to assist with getting the patient approved for mri arthogram with contrast. Awaiting a call back

## 2023-11-23 ENCOUNTER — Ambulatory Visit: Admitting: Family Medicine

## 2023-11-23 ENCOUNTER — Telehealth: Payer: Self-pay

## 2023-11-23 ENCOUNTER — Encounter: Payer: Self-pay | Admitting: Family Medicine

## 2023-11-23 VITALS — BP 148/86 | HR 90 | Temp 99.1°F | Ht 68.0 in | Wt 190.0 lb

## 2023-11-23 DIAGNOSIS — L02519 Cutaneous abscess of unspecified hand: Secondary | ICD-10-CM

## 2023-11-23 DIAGNOSIS — M25551 Pain in right hip: Secondary | ICD-10-CM | POA: Diagnosis not present

## 2023-11-23 DIAGNOSIS — L03119 Cellulitis of unspecified part of limb: Secondary | ICD-10-CM

## 2023-11-23 DIAGNOSIS — G8929 Other chronic pain: Secondary | ICD-10-CM

## 2023-11-23 MED ORDER — DOXYCYCLINE HYCLATE 100 MG PO TABS: 100.0000 mg | ORAL_TABLET | Freq: Two times a day (BID) | ORAL | 0 refills | Status: DC

## 2023-11-23 NOTE — Patient Instructions (Addendum)
 Keep taking doxy and let me know if the swelling/pink changes are not getting better.   We'll work on the MRI.  Take care.  Glad to see you.

## 2023-11-23 NOTE — Progress Notes (Unsigned)
 R hip pain, R FABIR testing positive.  D/w pt about trying to get MRI approved re: arthrogram.  Message was sent to referral coordinators today. The 1st MRI order needs an update re: contrast.  The 2nd order has already been denied by insurance.   He had previous plain films done at ortho.   He needs labrum eval with arthogram.  He is scheduled for imaging on 12/01/23.    Stung on R 5th finger on 11/20/23.  Then it got puffy and red about 30 hours later, had a streak up the 5th ray.  Had left over doxy and started that, improved in the meantime.  Less hot and red now.  Improved after 2nd dose of doxy.  No lip or tongue swelling.  More pain with making a fist yesterday.  That is better today.  Meds, vitals, and allergies reviewed.   ROS: Per HPI unless specifically indicated in ROS section   Nad Ncat Tongue without swelling. Lips without swelling. No stridor. Right hand with normal inspection except for mild pinkish change on the R 5th ray, more at the R 5th PIP.  Normal ROM today.  Mild local swelling along the right fifth finger. Normal radial pulse and distal capillary refill. R hip pain with flexion/adduction/int rotation.

## 2023-11-23 NOTE — Telephone Encounter (Signed)
 See phone note dated 6/23  Patient came in for appt, new phone note sent to our authorization team

## 2023-11-23 NOTE — Telephone Encounter (Signed)
 Patient needs authorization completed for the below MRI WITH Contrast   Pts previous MRIs have been denied, this ha since been upcoded to include contrast. Dr. Elfredia OV note should have enough supporting documentation to get this approved. Please assist.   Pt is scheduled to have this done on 12/01/23.

## 2023-11-24 DIAGNOSIS — L03119 Cellulitis of unspecified part of limb: Secondary | ICD-10-CM | POA: Insufficient documentation

## 2023-11-24 NOTE — Assessment & Plan Note (Signed)
 Improving on Doxy.  Without abscess.  Continue doxycycline .  Update me as needed.  Routine cautions given to patient.  This does not appear to be an allergic reaction.

## 2023-11-24 NOTE — Telephone Encounter (Signed)
 Patient notified of approval.

## 2023-11-24 NOTE — Telephone Encounter (Signed)
 Have verified approved see image below from chat with auth team. Please reach out to patient to let him know.

## 2023-11-24 NOTE — Assessment & Plan Note (Signed)
 R hip pain, R FABIR testing positive.  D/w pt about trying to get MRI approved re: arthrogram.  Message was sent to referral coordinators today. The 1st MRI order needs an update re: contrast.  The 2nd order has already been denied by insurance.   He had previous plain films done at ortho.   He needs labrum eval with arthogram.  He is scheduled for imaging on 12/01/23.

## 2023-11-26 NOTE — Telephone Encounter (Signed)
 Noted. Thanks.

## 2023-12-01 ENCOUNTER — Ambulatory Visit
Admission: RE | Admit: 2023-12-01 | Discharge: 2023-12-01 | Disposition: A | Discharge status: Home / Self Care | Source: Ambulatory Visit | Attending: Family Medicine | Admitting: Family Medicine

## 2023-12-01 ENCOUNTER — Inpatient Hospital Stay
Admission: RE | Admit: 2023-12-01 | Discharge: 2023-12-01 | Disposition: A | Discharge status: Home / Self Care | Source: Ambulatory Visit | Attending: Family Medicine | Admitting: Family Medicine

## 2023-12-01 DIAGNOSIS — G8929 Other chronic pain: Secondary | ICD-10-CM

## 2023-12-01 MED ORDER — IOPAMIDOL (ISOVUE-M 200) INJECTION 41%
10.0000 mL | Freq: Once | INTRAMUSCULAR | Status: AC
  Administered 2023-12-01: 10 mL via INTRA_ARTICULAR

## 2023-12-02 ENCOUNTER — Ambulatory Visit: Payer: Self-pay | Admitting: Family Medicine

## 2023-12-06 ENCOUNTER — Other Ambulatory Visit: Payer: Self-pay | Admitting: Family Medicine

## 2023-12-06 DIAGNOSIS — M2550 Pain in unspecified joint: Secondary | ICD-10-CM

## 2023-12-29 ENCOUNTER — Ambulatory Visit (INDEPENDENT_AMBULATORY_CARE_PROVIDER_SITE_OTHER)
Admission: RE | Admit: 2023-12-29 | Discharge: 2023-12-29 | Disposition: A | Discharge status: Home / Self Care | Source: Ambulatory Visit | Attending: Family Medicine | Admitting: Family Medicine

## 2023-12-29 ENCOUNTER — Ambulatory Visit: Admitting: Family Medicine

## 2023-12-29 VITALS — BP 120/70 | HR 71 | Temp 98.8°F | Resp 20 | Ht 68.0 in | Wt 191.5 lb

## 2023-12-29 DIAGNOSIS — R059 Cough, unspecified: Secondary | ICD-10-CM

## 2023-12-29 DIAGNOSIS — G8929 Other chronic pain: Secondary | ICD-10-CM

## 2023-12-29 DIAGNOSIS — F419 Anxiety disorder, unspecified: Secondary | ICD-10-CM | POA: Diagnosis not present

## 2023-12-29 DIAGNOSIS — M25551 Pain in right hip: Secondary | ICD-10-CM | POA: Diagnosis not present

## 2023-12-29 MED ORDER — ALPRAZOLAM 1 MG PO TABS: ORAL_TABLET | ORAL | 0 refills | Status: AC

## 2023-12-29 MED ORDER — ALBUTEROL SULFATE HFA 108 (90 BASE) MCG/ACT IN AERS: 1.0000 | INHALATION_SPRAY | Freq: Four times a day (QID) | RESPIRATORY_TRACT | 1 refills | Status: AC | PRN

## 2023-12-29 NOTE — Progress Notes (Unsigned)
 He didn't get relief with back injection but hip injection helped some.   Prev MRI with mild fraying of the labrum without evidence of a discrete labral tear.  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 He is considering PT f/u re: gluteus. He is still stretching at baseline.  He is going to f/u with rheumatology when possible.  Prev leg pain improved while on prednisone .   He had chest pressure and cough (with episodic sputum).  He had trouble swallowing w/o choking about 2 weeks ago. He didn't need heimlich maneuver.  He was able to swallow the food at the time.  No fevers.  Taking mucinex helped thin the sputum.    Travelling to [place] again soon.    Had used xanax  prn w/o ADE on med.  Rx sent.    Meds, vitals, and allergies reviewed.   ROS: Per HPI unless specifically indicated in ROS section

## 2023-12-29 NOTE — Patient Instructions (Addendum)
 Go to the lab on the way out.   If you have mychart we'll likely use that to update you.    Take care.  Glad to see you. I would use albuterol /mucinex if needed.

## 2023-12-30 ENCOUNTER — Telehealth: Payer: Self-pay | Admitting: Family Medicine

## 2023-12-30 NOTE — Assessment & Plan Note (Signed)
Continue Xanax as needed 

## 2023-12-30 NOTE — Telephone Encounter (Signed)
 See MyChart message regarding x-ray.

## 2023-12-30 NOTE — Assessment & Plan Note (Signed)
 Lungs are clear.  See notes on x-ray.  Continue albuterol /Mucinex in the meantime.  Update me as needed.

## 2023-12-30 NOTE — Assessment & Plan Note (Signed)
 He is going to see about PT and he has rheumatology follow-up pending.

## 2024-01-03 ENCOUNTER — Ambulatory Visit: Payer: Self-pay | Admitting: Family Medicine

## 2024-01-04 ENCOUNTER — Ambulatory Visit: Admitting: Family Medicine

## 2024-02-04 ENCOUNTER — Encounter: Payer: Self-pay | Admitting: Family Medicine

## 2024-03-03 ENCOUNTER — Ambulatory Visit: Admitting: Cardiology

## 2024-03-08 ENCOUNTER — Encounter: Payer: Self-pay | Admitting: Family Medicine

## 2024-03-08 ENCOUNTER — Ambulatory Visit (INDEPENDENT_AMBULATORY_CARE_PROVIDER_SITE_OTHER)
Admission: RE | Admit: 2024-03-08 | Discharge: 2024-03-08 | Disposition: A | Discharge status: Home / Self Care | Source: Ambulatory Visit | Attending: Family Medicine | Admitting: Family Medicine

## 2024-03-08 ENCOUNTER — Ambulatory Visit: Admitting: Family Medicine

## 2024-03-08 VITALS — BP 132/76 | HR 79 | Temp 99.4°F | Ht 68.0 in | Wt 191.8 lb

## 2024-03-08 DIAGNOSIS — M545 Low back pain, unspecified: Secondary | ICD-10-CM

## 2024-03-08 MED ORDER — PREDNISONE 10 MG PO TABS: ORAL_TABLET | ORAL | 0 refills | Status: DC

## 2024-03-08 NOTE — Patient Instructions (Addendum)
 Xray on the way out.  Restart prednisone  and update me if not better.  Take care.  Glad to see you.

## 2024-03-08 NOTE — Progress Notes (Unsigned)
 He is still having aches, L trap sore.  Episodic lower back pain.  He is still doing SI stretches for his lower back. He clearly improved on prednisone , temporarily.   Had taken meloxicam  vs advil, used voltaren gel.  Advil helped more than meloxicam .  Hip injection didn't help.  He prev felt a pop and pain in the R lower back near the R SI.    He has urology f/u pending.   Meds, vitals, and allergies reviewed.   ROS: Per HPI unless specifically indicated in ROS section   Nad Ncat Neck supple, no LA Rrr Ctab Back not ttp in midline.  R SI area sore.  Pain with R hip int rotation.  Able to bear weight.

## 2024-03-10 NOTE — Assessment & Plan Note (Signed)
 Lower back pain, near the right SI joint. Discussed options. Hip injection didn't help.  Lower L spine injection didn't help.  He hasn't had upper L spine injection.  He isn't having midline back pain.   Check SI joint films today.  Restart prednisone  routine cautions and update me as needed.  He agrees to plan.

## 2024-03-13 ENCOUNTER — Ambulatory Visit: Payer: Self-pay | Admitting: Family Medicine

## 2024-03-29 NOTE — Progress Notes (Deleted)
 Cardiology Clinic Note   Patient Name: [name] Date of Encounter: 03/29/2024  Primary Care Provider:  Cleatus Arlyss RAMAN, MD Primary Cardiologist:  Kardie Tobb, DO  Patient Profile    ***  Past Medical History    Past Medical History:  Diagnosis Date   Abdominal wall hernia 12/05/2019   Aches 05/12/2018   Advance care planning 06/14/2017   Living will d/w pt.  Lamar Dayhoff designated if patient were incapacitated.     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    Inattention 03/11/2011   Joint pain 08/13/2014   Medial meniscus tear    left   Migraine headache 01/22/2010   Qualifier: Diagnosis of  By: Dee CMA (AAMA), Lugene     Nasal septal deviation 01/25/2014   Neck pain 01/05/2019   Other specified abdominal hernia without obstruction or gangrene 02/28/2020   SHOULDER PAIN, LEFT 01/22/2010   Per [facility]     Skin lesion 02/07/2017   Tear of meniscus, medial    left knee   Temporal headache 02/06/2020   Ulnar neuropathy    mild symptoms    Unspecified inflammatory spondylopathy, 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 Overview:  Per [facility]   Wears glasses 12/11/2020   White coat syndrome with high blood pressure but without hypertension    Past Surgical History:  Procedure Laterality Date   ABDOMINAL SURGERY  04/2020   linea alba surgery. Dr [facility]    ANAL FISTULOTOMY N/A 12/17/2020   Procedure: PARTIAL ANAL FISTULOTOMY;  Surgeon: Teresa Lonni HERO, MD;  Location: [facility];  Service: General;  Laterality: N/A;    APPENDECTOMY  2012   lap append   CHOLECYSTECTOMY  2009   CLOSED REDUCTION CLAVICLE FRACTURE  1993   left   COLON SURGERY  2022   Colorectal fistula repair.   COLONOSCOPY  2008   ESOPHAGOGASTRODUODENOSCOPY     1990's [place]    GALLBLADDER SURGERY  2008   HERNIA REPAIR  2011   repair from gallbladder surgery   HERNIA REPAIR  2013   umbilical   HERNIA REPAIR  2022   VENTRAL HERNIA REPAIR   LITHOTRIPSY  2000   mesh surgery  2011   abdominal   NASAL SEPTOPLASTY W/ TURBINOPLASTY Bilateral 01/25/2014   Procedure: NASAL SEPTOPLASTY/BILATERAL INFERIOR TURBINATE REDUCTION;  Surgeon: Alm Bouche, MD;  Location: [facility];  Service: ENT;  Laterality: Bilateral;   PLACEMENT OF SETON N/A 12/17/2020   Procedure: JERLEAN OF CUTTING SETON;  Surgeon: Teresa Lonni HERO, MD;  Location: [facility];  Service: General;  Laterality: N/A;   RECTAL EXAM UNDER ANESTHESIA N/A 12/17/2020   Procedure: ANORECTAL EXAM UNDER ANESTHESIA;  Surgeon: Teresa Lonni HERO, MD;  Location: [facility];  Service: General;  Laterality: N/A;   WISDOM TOOTH EXTRACTION      Allergies  Allergies  Allergen Reactions   Clopidogrel Other (See Comments)    severe joint pain   Pseudoephedrine Other (See Comments)  Urinary retention   Sulfa Antibiotics Rash    REACTION: rash   Topiramate Other (See Comments)    Mood change at higher dose   Aimovig  [Erenumab -Aooe]     GI spasm   Amitriptyline     Intolerant, inc in anxiety   Celebrex [Celecoxib]     Flaking skin on the face.     Cephalexin     REACTION: rash   Citalopram  Hydrobromide     Sedation, lack of motivation   Crestor  [Rosuvastatin ]     Brain fog   Cyclobenzaprine Hcl     REACTION: sedation   Cymbalta  [Duloxetine  Hcl]     Mood changes, BP elevation on med.    Diazepam     Intolerant, paradoxical reaction.  Has tolerated alprazolam  without similar reaction.   Levaquin [Levofloxacin]  Other (See Comments)    Diffuse aches while on med.    Sertraline      Lack of motivation on med.    Sumatriptan    Codeine Nausea Only    REACTION: nausea. Can tolerate hydrocodone    Venlafaxine Nausea Only    Nausea, intolerant    History of Present Illness    ***  Home Medications    Prior to Admission medications   Medication Sig Start Date End Date Taking? Authorizing Provider  albuterol  (VENTOLIN  HFA) 108 (90 Base) MCG/ACT inhaler Inhale 1-2 puffs into the lungs every 6 (six) hours as needed (for cough). 12/29/23   Cleatus Arlyss RAMAN, MD  ALPRAZolam  (XANAX ) 1 MG tablet TAKE ONE-HALF TO ONE TABLET BY MOUTH TWICE DAILY AS NEEDED FOR ANXIETY 12/29/23   Cleatus Arlyss RAMAN, MD  azelastine  (ASTELIN ) 0.1 % nasal spray Place 1 spray into both nostrils 2 (two) times daily as needed for rhinitis. 06/05/23   Cleatus Arlyss RAMAN, MD  hydrocortisone 2.5 % cream Apply 1 Application topically. PRN 04/25/23   [provider]  levocetirizine (XYZAL ) 5 MG tablet Take 5 mg by mouth daily. As needed    [provider]  predniSONE  (DELTASONE ) 10 MG tablet Take 2 a day for 5 days, then 1 a day for 5 days, with food. Don't take with aleve/ibuprofen/meloxicam  03/08/24   Cleatus Arlyss RAMAN, MD    Family History    Family History  Problem Relation Age of Onset   Cancer Mother        gyn cancer   Liver disease Mother        NASH   Esophageal varices Mother        due to spleen and liver fuctions not working properly    COPD Mother    Heart disease Father    Kidney disease Father    Colon cancer Neg Hx    Prostate cancer Neg Hx    Esophageal cancer Neg Hx    Rectal cancer Neg Hx    Stomach cancer Neg Hx    He indicated that his mother is deceased. He indicated that his father is deceased. He indicated that the status of his neg hx is unknown. He indicated that his other is deceased.  Social History    Social History   Socioeconomic History   Marital status: Married    Spouse name:  Not on file   Number of children: Not on file   Years of education: Not on file   Highest education level: Associate degree: occupational, scientist, product/process development, or vocational program  Occupational History   Occupation: Risk analyst, freelance work    Associate Professor: [employer]  Occupation: PTA  Tobacco Use   Smoking status: Never   Smokeless tobacco: Never  Vaping Use   Vaping status: Never Used  Substance and Sexual Activity   Alcohol use: Yes    Alcohol/week: 0.0 standard drinks of alcohol    Comment: 1 glass of wine/month   Drug use: No   Sexual activity: Not on file  Other Topics Concern   Not on file  Social History Narrative   Long term relationship    Regular exercise- yes   Doing relief/PRN PT work.   Social Drivers of Corporate Investment Banker Strain: Low Risk  (12/29/2023)   Overall Financial Resource Strain (CARDIA)    Difficulty of Paying Living Expenses: Not very hard  Food Insecurity: No Food Insecurity (12/29/2023)   Hunger Vital Sign    Worried About Running Out of Food in the Last Year: Never true    Ran Out of Food in the Last Year: Never true  Transportation Needs: Unknown (12/29/2023)   PRAPARE - Administrator, Civil Service (Medical): Not on file    Lack of Transportation (Non-Medical): No  Physical Activity: Sufficiently Active (12/29/2023)   Exercise Vital Sign    Days of Exercise per Week: 5 days    Minutes of Exercise per Session: 90 min  Stress: Stress Concern Present (12/29/2023)   [employer] of Occupational Health - Occupational Stress Questionnaire    Feeling of Stress: To some extent  Social Connections: Socially Isolated (12/29/2023)   Social Connection and Isolation Panel    Frequency of Communication with Friends and Family: Once a week    Frequency of Social Gatherings with Friends and Family: Once a week    Attends Religious Services: Never    Database Administrator or Organizations: No    Attends Museum/gallery Exhibitions Officer: Not on file    Marital Status: Married  Intimate Partner Violence: Patient Declined (11/10/2023)   Received from [facility]   HITS    Over the last 12 months how often did your partner physically hurt you?: Patient declined    Over the last 12 months how often did your partner insult you or talk down to you?: Patient declined    Over the last 12 months how often did your partner threaten you with physical harm?: Patient declined    Over the last 12 months how often did your partner scream or curse at you?: Patient declined     Review of Systems    General:  No chills, fever, night sweats or weight changes.  Cardiovascular:  No chest pain, dyspnea on exertion, edema, orthopnea, palpitations, paroxysmal nocturnal dyspnea. Dermatological: No rash, lesions/masses Respiratory: No cough, dyspnea Urologic: No hematuria, dysuria Abdominal:   No nausea, vomiting, diarrhea, bright red blood per rectum, melena, or hematemesis Neurologic:  No visual changes, wkns, changes in mental status. All other systems reviewed and are otherwise negative except as noted above.  Physical Exam    VS:  There were no vitals taken for this visit. , BMI There is no height or weight on file to calculate BMI. GEN: Well nourished, well developed, in no acute distress. HEENT: normal. Neck: Supple, no JVD, carotid bruits, or masses. Cardiac: RRR, no murmurs, rubs, or gallops. No clubbing, cyanosis, edema.  Radials/DP/PT 2+ and equal bilaterally.  Respiratory:  Respirations regular and unlabored, clear to auscultation bilaterally. GI: Soft, nontender, nondistended, BS + x 4. MS: no deformity or atrophy. Skin: warm and dry, no  rash. Neuro:  Strength and sensation are intact. Psych: Normal affect.  Accessory Clinical Findings    Recent Labs: No results found for requested labs within last 365 days.   Recent Lipid Panel    Component Value Date/Time   CHOL 170 02/26/2023 1023   CHOL 155  03/25/2022 1228   TRIG 74.0 02/26/2023 1023   HDL 43.90 02/26/2023 1023   HDL 36 (L) 03/25/2022 1228   CHOLHDL 4 02/26/2023 1023   VLDL 14.8 02/26/2023 1023   LDLCALC 111 (H) 02/26/2023 1023   LDLCALC 104 (H) 03/25/2022 1228    No BP recorded.  {Refresh Note OR Click here to enter BP  :1}***    ECG personally reviewed by me today- ***          Assessment & Plan   1.  ***   Josefa HERO. Aleiah Mohammed NP-C     03/29/2024, 7:41 AM [facility] [address] [place], [place] [address] Office [phone]    Notice: This dictation was prepared with Dragon dictation along with smaller phrase technology. Any transcriptional errors that result from this process are unintentional and may not be corrected upon review.   I spent***minutes examining this patient, reviewing medications, and using patient centered shared decision making involving their cardiac care.   I spent  20 minutes reviewing past medical history,  medications, and prior cardiac tests.

## 2024-03-31 ENCOUNTER — Ambulatory Visit: Admitting: General Practice

## 2024-04-24 ENCOUNTER — Ambulatory Visit: Payer: Self-pay | Admitting: Family Medicine

## 2024-05-04 ENCOUNTER — Encounter: Payer: Self-pay | Admitting: Family Medicine

## 2024-05-05 ENCOUNTER — Encounter: Payer: Self-pay | Admitting: Family Medicine

## 2024-05-05 ENCOUNTER — Ambulatory Visit: Admitting: Family Medicine

## 2024-05-05 VITALS — BP 122/80 | HR 74 | Temp 99.4°F | Ht 67.0 in | Wt 193.5 lb

## 2024-05-05 DIAGNOSIS — Z0000 Encounter for general adult medical examination without abnormal findings: Secondary | ICD-10-CM

## 2024-05-05 DIAGNOSIS — M549 Dorsalgia, unspecified: Secondary | ICD-10-CM

## 2024-05-05 DIAGNOSIS — M47812 Spondylosis without myelopathy or radiculopathy, cervical region: Secondary | ICD-10-CM

## 2024-05-05 DIAGNOSIS — F419 Anxiety disorder, unspecified: Secondary | ICD-10-CM

## 2024-05-05 DIAGNOSIS — Z7189 Other specified counseling: Secondary | ICD-10-CM

## 2024-05-05 DIAGNOSIS — Z8249 Family history of ischemic heart disease and other diseases of the circulatory system: Secondary | ICD-10-CM

## 2024-05-05 LAB — COMPREHENSIVE METABOLIC PANEL WITH GFR
ALT: 19 U/L (ref 0–53)
AST: 21 U/L (ref 0–37)
Albumin: 4.7 g/dL (ref 3.5–5.2)
Alkaline Phosphatase: 58 U/L (ref 39–117)
BUN: 9 mg/dL (ref 6–23)
CO2: 30 meq/L (ref 19–32)
Calcium: 9.7 mg/dL (ref 8.4–10.5)
Chloride: 104 meq/L (ref 96–112)
Creatinine, Ser: 1.05 mg/dL (ref 0.40–1.50)
GFR: 80.6 mL/min (ref >60.00)
Glucose, Bld: 94 mg/dL (ref 70–99)
Potassium: 3.8 meq/L (ref 3.5–5.1)
Sodium: 141 meq/L (ref 135–145)
Total Bilirubin: 1.6 mg/dL — ABNORMAL HIGH (ref 0.2–1.2)
Total Protein: 7.3 g/dL (ref 6.0–8.3)

## 2024-05-05 LAB — LIPID PANEL
Cholesterol: 157 mg/dL (ref 0–200)
HDL: 32.5 mg/dL — ABNORMAL LOW (ref >39.00)
LDL Cholesterol: 104 mg/dL — ABNORMAL HIGH (ref 0–99)
NonHDL: 124.66
Total CHOL/HDL Ratio: 5
Triglycerides: 102 mg/dL (ref 0.0–149.0)
VLDL: 20.4 mg/dL (ref 0.0–40.0)

## 2024-05-05 MED ORDER — PREDNISONE 5 MG PO TABS: 5.0000 mg | ORAL_TABLET | Freq: Every day | ORAL | 0 refills | Status: AC

## 2024-05-05 NOTE — Patient Instructions (Signed)
 Go to the lab on the way out.   If you have mychart we'll likely use that to update you.    Take care.  Glad to see you. Try prednisone  in the meantime.

## 2024-05-05 NOTE — Progress Notes (Signed)
 CPE- See plan.  Routine anticipatory guidance given to patient.  See health maintenance.  The possibility exists that previously documented standard health maintenance information may have been brought forward from a previous encounter into this note.  If needed, that same information has been updated to reflect the current situation based on today's encounter.    Tetanus 2024 Flu 2025 PNA not due. Shingrix prev done covid 2025 Colonoscopy 2022 PSA 2025 Living will d/w pt.  [name] designated if patient were incapacitated.   Diet and exercise d/w pt.  HIV and HCV prev done at [facility] ~1991  F/u labs pending.   He has ENT f/u pending re: voice changes.    He has OV for possible RFA pending re: neck.    D/w pt about joint pain and options.  Off prednisone  currently.  He clearly felt better while on med. Had been taking advil prn in the meantime, for R SI pain. D/w pt about potentially trying dry needling.  Still sore near the R SI on palpation.   D/w pt about low dose of prednisone  to bridge to his appointment with dry needling.    Rare use of xanax . We can refill later- sporadic use.   He uses it with travel/flights.    PMH and SH reviewed  Meds, vitals, and allergies reviewed.   ROS: Per HPI.  Unless specifically indicated otherwise in HPI, the patient denies:  General: fever. Eyes: acute vision changes ENT: sore throat Cardiovascular: chest pain Respiratory: SOB GI: vomiting GU: dysuria Musculoskeletal: acute back pain Derm: acute rash Neuro: acute motor dysfunction Psych: worsening mood Endocrine: polydipsia Heme: bleeding Allergy: hayfever  GEN: nad, alert and oriented HEENT: mucous membranes moist NECK: supple w/o LA CV: rrr. PULM: ctab, no inc wob ABD: soft, +bs EXT: no edema SKIN: no acute rash sore near the R SI on palpation.

## 2024-05-05 NOTE — Telephone Encounter (Signed)
 See OV  note

## 2024-05-08 ENCOUNTER — Ambulatory Visit: Payer: Self-pay | Admitting: Family Medicine

## 2024-05-08 NOTE — Assessment & Plan Note (Signed)
Living will d/w pt.  Bethanne Ginger designated if patient were incapacitated.

## 2024-05-08 NOTE — Assessment & Plan Note (Signed)
 Tetanus 2024 Flu 2025 PNA not due. Shingrix prev done covid 2025 Colonoscopy 2022 PSA 2025 Living will d/w pt.  [name] designated if patient were incapacitated.   Diet and exercise d/w pt.  HIV and HCV prev done at [facility] ~1991

## 2024-05-08 NOTE — Assessment & Plan Note (Signed)
 D/w pt about low dose of prednisone  to bridge to his appointment with dry needling.   Steroid cautions d/w pt.  He can taper as tolerated.

## 2024-05-08 NOTE — Assessment & Plan Note (Signed)
 Rare use of xanax . We can refill later- sporadic use.   He uses it with travel/flights.

## 2024-05-08 NOTE — Assessment & Plan Note (Signed)
 He has OV for possible RFA pending re: neck.
